# Patient Record
Sex: Female | Born: 1970
Health system: Southern US, Community
[De-identification: ages and names within clinical notes are randomized; demographics above are authoritative.]

## PROBLEM LIST (undated history)

## (undated) ENCOUNTER — Emergency Department (HOSPITAL_BASED_OUTPATIENT_CLINIC_OR_DEPARTMENT_OTHER): Payer: BC Managed Care – PPO

## (undated) DIAGNOSIS — F32A Depression, unspecified: Secondary | ICD-10-CM

## (undated) DIAGNOSIS — Z8719 Personal history of other diseases of the digestive system: Secondary | ICD-10-CM

## (undated) DIAGNOSIS — G43909 Migraine, unspecified, not intractable, without status migrainosus: Secondary | ICD-10-CM

## (undated) DIAGNOSIS — T783XXA Angioneurotic edema, initial encounter: Secondary | ICD-10-CM

## (undated) DIAGNOSIS — J45909 Unspecified asthma, uncomplicated: Secondary | ICD-10-CM

## (undated) DIAGNOSIS — R32 Unspecified urinary incontinence: Secondary | ICD-10-CM

## (undated) DIAGNOSIS — A630 Anogenital (venereal) warts: Secondary | ICD-10-CM

## (undated) DIAGNOSIS — R87619 Unspecified abnormal cytological findings in specimens from cervix uteri: Secondary | ICD-10-CM

## (undated) DIAGNOSIS — F419 Anxiety disorder, unspecified: Secondary | ICD-10-CM

## (undated) DIAGNOSIS — B019 Varicella without complication: Secondary | ICD-10-CM

## (undated) DIAGNOSIS — K219 Gastro-esophageal reflux disease without esophagitis: Secondary | ICD-10-CM

## (undated) DIAGNOSIS — F329 Major depressive disorder, single episode, unspecified: Secondary | ICD-10-CM

## (undated) DIAGNOSIS — Z8619 Personal history of other infectious and parasitic diseases: Secondary | ICD-10-CM

## (undated) DIAGNOSIS — Z87440 Personal history of urinary (tract) infections: Secondary | ICD-10-CM

## (undated) DIAGNOSIS — L509 Urticaria, unspecified: Secondary | ICD-10-CM

## (undated) HISTORY — DX: Unspecified asthma, uncomplicated: J45.909

## (undated) HISTORY — DX: Depression, unspecified: F32.A

## (undated) HISTORY — DX: Urticaria, unspecified: L50.9

## (undated) HISTORY — DX: Personal history of urinary (tract) infections: Z87.440

## (undated) HISTORY — DX: Gastro-esophageal reflux disease without esophagitis: K21.9

## (undated) HISTORY — DX: Personal history of other infectious and parasitic diseases: Z86.19

## (undated) HISTORY — DX: Unspecified urinary incontinence: R32

## (undated) HISTORY — DX: Anxiety disorder, unspecified: F41.9

## (undated) HISTORY — DX: Personal history of other diseases of the digestive system: Z87.19

## (undated) HISTORY — DX: Angioneurotic edema, initial encounter: T78.3XXA

## (undated) HISTORY — DX: Unspecified abnormal cytological findings in specimens from cervix uteri: R87.619

## (undated) HISTORY — DX: Varicella without complication: B01.9

## (undated) HISTORY — DX: Anogenital (venereal) warts: A63.0

## (undated) HISTORY — DX: Migraine, unspecified, not intractable, without status migrainosus: G43.909

## (undated) HISTORY — PX: TONSILLECTOMY: SUR1361

## (undated) HISTORY — PX: ADENOIDECTOMY: SUR15

## (undated) HISTORY — DX: Major depressive disorder, single episode, unspecified: F32.9

---
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
## 1994-07-29 DIAGNOSIS — R87619 Unspecified abnormal cytological findings in specimens from cervix uteri: Secondary | ICD-10-CM

## 1994-07-29 HISTORY — DX: Unspecified abnormal cytological findings in specimens from cervix uteri: R87.619

## 1994-07-29 HISTORY — PX: TONSILLECTOMY AND ADENOIDECTOMY: SHX28

## 1994-07-29 HISTORY — PX: CERVICAL BIOPSY  W/ LOOP ELECTRODE EXCISION: SUR135

## 1995-07-30 HISTORY — PX: CHOLECYSTECTOMY: SHX55

## 2010-09-18 DIAGNOSIS — R7301 Impaired fasting glucose: Secondary | ICD-10-CM | POA: Insufficient documentation

## 2014-05-24 DIAGNOSIS — M545 Low back pain, unspecified: Secondary | ICD-10-CM | POA: Insufficient documentation

## 2016-08-20 DIAGNOSIS — R7401 Elevation of levels of liver transaminase levels: Secondary | ICD-10-CM | POA: Insufficient documentation

## 2016-08-20 DIAGNOSIS — R740 Nonspecific elevation of levels of transaminase and lactic acid dehydrogenase [LDH]: Secondary | ICD-10-CM

## 2016-08-20 DIAGNOSIS — R7303 Prediabetes: Secondary | ICD-10-CM | POA: Insufficient documentation

## 2016-08-20 DIAGNOSIS — K922 Gastrointestinal hemorrhage, unspecified: Secondary | ICD-10-CM | POA: Insufficient documentation

## 2017-10-13 ENCOUNTER — Encounter: Payer: Self-pay | Admitting: Family Medicine

## 2017-10-13 ENCOUNTER — Ambulatory Visit (INDEPENDENT_AMBULATORY_CARE_PROVIDER_SITE_OTHER)
Admission: RE | Admit: 2017-10-13 | Discharge: 2017-10-13 | Disposition: A | Discharge status: Home / Self Care | Payer: No Typology Code available for payment source | Source: Ambulatory Visit | Attending: Family Medicine | Admitting: Family Medicine

## 2017-10-13 ENCOUNTER — Ambulatory Visit (INDEPENDENT_AMBULATORY_CARE_PROVIDER_SITE_OTHER): Payer: No Typology Code available for payment source | Admitting: Family Medicine

## 2017-10-13 VITALS — BP 126/82 | HR 67 | Temp 98.5°F | Resp 12 | Ht 67.0 in | Wt 313.2 lb

## 2017-10-13 DIAGNOSIS — R739 Hyperglycemia, unspecified: Secondary | ICD-10-CM

## 2017-10-13 DIAGNOSIS — G43009 Migraine without aura, not intractable, without status migrainosus: Secondary | ICD-10-CM | POA: Diagnosis not present

## 2017-10-13 DIAGNOSIS — M545 Low back pain, unspecified: Secondary | ICD-10-CM

## 2017-10-13 LAB — URINALYSIS, ROUTINE W REFLEX MICROSCOPIC
Bilirubin Urine: NEGATIVE
Hgb urine dipstick: NEGATIVE
KETONES UR: NEGATIVE
Leukocytes, UA: NEGATIVE
Nitrite: NEGATIVE
PH: 6 (ref 5.0–8.0)
RBC / HPF: NONE SEEN (ref 0–?)
SPECIFIC GRAVITY, URINE: 1.025 (ref 1.000–1.030)
Total Protein, Urine: NEGATIVE
UROBILINOGEN UA: 0.2 (ref 0.0–1.0)
Urine Glucose: NEGATIVE

## 2017-10-13 LAB — CBC
HCT: 41 % (ref 36.0–46.0)
Hemoglobin: 13.7 g/dL (ref 12.0–15.0)
MCHC: 33.4 g/dL (ref 30.0–36.0)
MCV: 85.9 fl (ref 78.0–100.0)
PLATELETS: 255 10*3/uL (ref 150.0–400.0)
RBC: 4.78 Mil/uL (ref 3.87–5.11)
RDW: 14 % (ref 11.5–15.5)
WBC: 6.3 10*3/uL (ref 4.0–10.5)

## 2017-10-13 LAB — BASIC METABOLIC PANEL
BUN: 8 mg/dL (ref 6–23)
CALCIUM: 9.1 mg/dL (ref 8.4–10.5)
CO2: 28 meq/L (ref 19–32)
Chloride: 102 mEq/L (ref 96–112)
Creatinine, Ser: 0.65 mg/dL (ref 0.40–1.20)
GFR: 104.46 mL/min (ref >60.00)
GLUCOSE: 95 mg/dL (ref 70–99)
POTASSIUM: 3.9 meq/L (ref 3.5–5.1)
Sodium: 137 mEq/L (ref 135–145)

## 2017-10-13 LAB — HEMOGLOBIN A1C: Hgb A1c MFr Bld: 5.6 % (ref 4.6–6.5)

## 2017-10-13 MED ORDER — METHOCARBAMOL 500 MG PO TABS: 500.0000 mg | ORAL_TABLET | Freq: Three times a day (TID) | ORAL | 1 refills | Status: DC | PRN

## 2017-10-13 MED FILL — METHOCARBAMOL 500 MG TABS: 500 | 15 days supply | Qty: 45 | Fill #0

## 2017-10-13 NOTE — Patient Instructions (Addendum)
A few things to remember from today's visit:   Right-sided low back pain without sciatica, unspecified chronicity - Plan: DG Lumbar Spine Complete, CBC, Urinalysis, Routine w reflex microscopic  Hyperglycemia - Plan: Basic metabolic panel, Hemoglobin A1c    Back pain is very common in adults.The cause of back pain is rarely dangerous and the pain often gets better over time even with no pharmacologic treatment.  The cause of your back pain may not be known. Some common causes of back pain include: 1. Strain of the muscles or ligaments supporting the spine. 2. Wear and tear (degeneration) of the spinal disks. 3. Arthritis. 4. Direct injury to the back.  For many people, back pain may return. Since back pain is rarely dangerous, most people can learn to manage this condition on their own.  HOME CARE INSTRUCTIONS Watch your back pain for any changes. The following actions may help to lessen any discomfort you are feeling:  1. Remain active. It is stressful on your back to sit or stand in one place for long periods of time. Do not sit, drive, or stand in one place for more than 30 minutes at a time. Take short walks on even surfaces as soon as you are able.Try to increase the length of time you walk each day.  2. Exercise regularly as directed by your health care provider. Exercise helps your back heal faster. It also helps avoid future injury by keeping your muscles strong and flexible.  3. Do not stay in bed.Resting more than 1-2 days can delay your recovery.                                                      4. Pay attention to your body when you bend and lift. The most comfortable positions are those that put less stress on your recovering back.  5.  Always use proper lifting techniques, including: Bending your knees. Keeping the load close to your body. Avoiding twisting.  6. Find a comfortable position to sleep. Use a firm mattress and lie on your side with your knees  slightly bent. If you lie on your back, put a pillow under your knees.  7. Over the counter rubbing medications like Icy Hot or Asper cream with Lidocaine may help without significant side effects.  Acetaminophen and/or Aleve/Ibuprofen can be taken if needed and if not contraindications. Local ice and heat may be alternated to reduce pain and spasms. Also massage and even chiropractor treatment.      Muscle relaxants might or might not help, they cause drowsiness among other    side effects. They could also interact with some of medications you may be already taking (medications for depression/anxiety and some pain medications).   8. Maintain a healthy weight. Excess weight puts extra stress on your back and makes it difficult to maintain good posture.   SEEK MEDICAL CARE IF: worsening pain, associated fever, rash/edema on area, pain going to legs or buttocks, numbness/tingling, night pain, or abnormal weight loss.    SEEK IMMEDIATE MEDICAL CARE IF:  1. You develop new bowel or bladder control problems. 2. You have unusual weakness or numbness in your arms or legs. 3. You develop nausea or vomiting. 4. You develop abdominal pain. 5. You feel faint.     Back Exercises The following exercises strengthen  the muscles that help to support the back. They also help to keep the lower back flexible. Doing these exercises can help to prevent back pain or lessen existing pain. If you have back pain or discomfort, try doing these exercises 2-3 times each day or as told by your health care provider. When the pain goes away, do them once each day, but increase the number of times that you repeat the steps for each exercise (do more repetitions). If you do not have back pain or discomfort, do these exercises once each day or as told by your health care provider.   EXERCISES Single Knee to Chest Repeat these steps 3-5 times for each leg: 5. Lie on your back on a firm bed or the floor with your legs  extended. 6. Bring one knee to your chest. Your other leg should stay extended and in contact with the floor. 7. Hold your knee in place by grabbing your knee or thigh. 8. Pull on your knee until you feel a gentle stretch in your lower back. 9. Hold the stretch for 10-30 seconds. 10. Slowly release and straighten your leg.  Pelvic Tilt Repeat these steps 5-10 times: 2. Lie on your back on a firm bed or the floor with your legs extended. 3. Bend your knees so they are pointing toward the ceiling and your feet are flat on the floor. 4. Tighten your lower abdominal muscles to press your lower back against the floor. This motion will tilt your pelvis so your tailbone points up toward the ceiling instead of pointing to your feet or the floor. 5. With gentle tension and even breathing, hold this position for 5-10 seconds.  Cat-Cow Repeat these steps until your lower back becomes more flexible: 1. Get into a hands-and-knees position on a firm surface. Keep your hands under your shoulders, and keep your knees under your hips. You may place padding under your knees for comfort. 2. Let your head hang down, and point your tailbone toward the floor so your lower back becomes rounded like the back of a cat. 3. Hold this position for 5 seconds. 4. Slowly lift your head and point your tailbone up toward the ceiling so your back forms a sagging arch like the back of a cow. 5. Hold this position for 5 seconds.   Press-Ups Repeat these steps 5-10 times: 6. Lie on your abdomen (face-down) on the floor. 7. Place your palms near your head, about shoulder-width apart. 8. While you keep your back as relaxed as possible and keep your hips on the floor, slowly straighten your arms to raise the top half of your body and lift your shoulders. Do not use your back muscles to raise your upper torso. You may adjust the placement of your hands to make yourself more comfortable. 9. Hold this position for 5 seconds while  you keep your back relaxed. 10. Slowly return to lying flat on the floor.   Bridges Repeat these steps 10 times: 1. Lie on your back on a firm surface. 2. Bend your knees so they are pointing toward the ceiling and your feet are flat on the floor. 3. Tighten your buttocks muscles and lift your buttocks off of the floor until your waist is at almost the same height as your knees. You should feel the muscles working in your buttocks and the back of your thighs. If you do not feel these muscles, slide your feet 1-2 inches farther away from your buttocks. 4. Hold this  position for 3-5 seconds. 5. Slowly lower your hips to the starting position, and allow your buttocks muscles to relax completely. If this exercise is too easy, try doing it with your arms crossed over your chest.    Please be sure medication list is accurate. If a new problem present, please set up appointment sooner than planned today.

## 2017-10-13 NOTE — Progress Notes (Signed)
HPI:   Ms.Trinitey Oree Mirelez is a 47 y.o. female, who is here today to establish care.  Former PCP: Dr Edwin Dada Last preventive routine visit: 2017  Chronic medical problems: Past Hx of depression and anxiety in her early 20's, abnormal LFT's, back pain and arthralgias. Migraine headaches, she has not had one in a couple months. She takes Maxalt 10 mg as needed. Headache is bitemporal,parietal,and sometimes occipital. It seems trigger by stress. No associated nausea or vomiting,sometiems photophobia.  She started exercising a couple weeks ago. She has not been consistent with a healthy diet.  Hx of asthma: She has occasional exacerbations, she is on Albuterol inh prn.  Concerns today: Possible "kidney stone." No Hx of nephrolithiasis.  She has had right lower back pain for the past 2 months, it has been intermittently for years. Denies dysuria,increased urinary frequency, gross hematuria,or decreased urine output. Hx of urine urgency incontinence.  She had diarrhea a week ago but bowel habits back to his normal. Nausea, which she has had for 5 years ,intermittently.  She used to get "steroid shots" , no longer radiated to LE's. Exacerbated by certain movements, changing positions in bed. Alleviated by rest.  Sharp 10/10. No Hx of recent injury. Takes Ibuprofen as needed.   She denies saddle anesthesia or bowel incontinence.  Hyperglycemia,no Hx of DM. 69-113.  "Mole" she has had for many years. She thinks it is growing. No Hx of skin cancer. No tenderness or easy bleeding.   Review of Systems  Constitutional: Negative for activity change, appetite change, fatigue and fever.  HENT: Negative for mouth sores, sore throat and trouble swallowing.   Eyes: Negative for redness and visual disturbance.  Respiratory: Negative for cough, shortness of breath and wheezing.   Cardiovascular: Negative for leg swelling.  Gastrointestinal: Positive for nausea. Negative  for abdominal pain, blood in stool and vomiting.  Endocrine: Negative for cold intolerance and heat intolerance.  Genitourinary: Negative for decreased urine volume, dysuria, hematuria, vaginal bleeding and vaginal discharge.  Musculoskeletal: Positive for arthralgias (knees), back pain and neck pain. Negative for joint swelling.  Skin: Negative for rash.  Allergic/Immunologic: Positive for environmental allergies.  Neurological: Negative for syncope, weakness, numbness and headaches.  Hematological: Negative for adenopathy. Does not bruise/bleed easily.  Psychiatric/Behavioral: Positive for sleep disturbance. Negative for confusion. The patient is nervous/anxious.       Current Outpatient Medications on File Prior to Visit  Medication Sig Dispense Refill  . albuterol (VENTOLIN HFA) 108 (90 Base) MCG/ACT inhaler as needed.    Marland Kitchen ibuprofen (ADVIL,MOTRIN) 200 MG tablet Take 200 mg by mouth as needed.     No current facility-administered medications on file prior to visit.      Past Medical History:  Diagnosis Date  . Asthma   . Chicken pox   . Depression   . GERD (gastroesophageal reflux disease)   . History of diverticulitis   . History of genital warts   . Hx: UTI (urinary tract infection)   . Migraines   . Urine incontinence    Allergies  Allergen Reactions  . Cephalosporins Hives and Rash  . Penicillins Hives and Rash    Family History  Problem Relation Age of Onset  . Arthritis Mother   . Cancer Mother   . Depression Mother   . Hyperlipidemia Mother   . Miscarriages / [place] Mother   . Early death Father   . Alcohol abuse Maternal Grandmother   . Arthritis Maternal Grandmother   .  Birth defects Maternal Grandmother   . Depression Maternal Grandmother   . Diabetes Maternal Grandmother   . Hyperlipidemia Maternal Grandmother   . Hypertension Maternal Grandmother   . Kidney disease Maternal Grandmother   . Arthritis Maternal Grandfather   . Birth defects  Maternal Grandfather   . Hypertension Maternal Grandfather   . Kidney disease Maternal Grandfather   . Heart attack Maternal Grandfather   . Alcohol abuse Paternal Grandmother   . Birth defects Paternal Grandmother   . Alcohol abuse Paternal Grandfather     Social History   Socioeconomic History  . Marital status: Married    Spouse name: Not on file  . Number of children: 0  . Years of education: Not on file  . Highest education level: Not on file  Occupational History  . Not on file  Social Needs  . Financial resource strain: Not on file  . Food insecurity:    Worry: Not on file    Inability: Not on file  . Transportation needs:    Medical: Not on file    Non-medical: Not on file  Tobacco Use  . Smoking status: Never Smoker  . Smokeless tobacco: Never Used  Substance and Sexual Activity  . Alcohol use: No    Frequency: Never  . Drug use: No  . Sexual activity: Yes    Partners: Male  Lifestyle  . Physical activity:    Days per week: Not on file    Minutes per session: Not on file  . Stress: Not on file  Relationships  . Social connections:    Talks on phone: Not on file    Gets together: Not on file    Attends religious service: Not on file    Active member of club or organization: Not on file    Attends meetings of clubs or organizations: Not on file    Relationship status: Not on file  Other Topics Concern  . Not on file  Social History Narrative  . Not on file    Vitals:   10/13/17 1039  BP: 126/82  Pulse: 67  Resp: 12  Temp: 98.5 F (36.9 C)  SpO2: 97%    Body mass index is 49.06 kg/m.   Physical Exam  Nursing note and vitals reviewed. Constitutional: She is oriented to person, place, and time. She appears well-developed. No distress.  HENT:  Head: Normocephalic and atraumatic.  Mouth/Throat: Oropharynx is clear and moist and mucous membranes are normal.  Eyes: Pupils are equal, round, and reactive to light. Conjunctivae are normal.    Cardiovascular: Normal rate and regular rhythm.  No murmur heard. Pulses:      Dorsalis pedis pulses are 2+ on the right side, and 2+ on the left side.  Respiratory: Effort normal and breath sounds normal. No respiratory distress.  GI: Soft. She exhibits no mass. There is no hepatomegaly. There is no tenderness. There is no CVA tenderness.  Musculoskeletal: She exhibits no edema.       Lumbar back: She exhibits tenderness. She exhibits no bony tenderness.       Back:  Lymphadenopathy:    She has no cervical adenopathy.  Neurological: She is alert and oriented to person, place, and time. She has normal strength. Gait normal.  SLR negative bilateral.  Skin: Skin is warm. No rash noted. No erythema.  Psychiatric: Her mood appears anxious.  Well groomed, good eye contact.      ASSESSMENT AND PLAN:   Ms.Nalany was seen today  for establish care.  Diagnoses and all orders for this visit:  Lab Results  Component Value Date   WBC 6.3 10/13/2017   HGB 13.7 10/13/2017   HCT 41.0 10/13/2017   MCV 85.9 10/13/2017   PLT 255.0 10/13/2017   Lab Results  Component Value Date   CREATININE 0.65 10/13/2017   BUN 8 10/13/2017   NA 137 10/13/2017   K 3.9 10/13/2017   CL 102 10/13/2017   CO2 28 10/13/2017   Lab Results  Component Value Date   HGBA1C 5.6 10/13/2017    Right-sided low back pain without sciatica, unspecified chronicity  Hx of chronic back pain. I do not think back pain etiology is nephrolithiasis. Further recommendations will be given according to labs/imaging results. Local ice and massage or Icy hot patch may help. Instructed about warning signs.  -     DG Lumbar Spine Complete; Future -     CBC -     Urinalysis, Routine w reflex microscopic -     methocarbamol (ROBAXIN) 500 MG tablet; Take 1 tablet (500 mg total) by mouth every 8 (eight) hours as needed for muscle spasms.  Hyperglycemia  Primary prevention through a healthy life style recommended for  primary prevention.  -     Basic metabolic panel -     Hemoglobin A1c  Morbid obesity with body mass index (BMI) of 40.0 to 49.9 (HCC)  We discussed benefits of wt loss as well as adverse effects of obesity. Consistency with healthy diet and physical activity recommended. Daily brisk walking for 15-30 min as tolerated.  Migraine without aura and without status migrainosus, not intractable  Reported by pt. ? Combination with tension headache. Improved. No changes in current management. F/U annually,before if needed.  -     rizatriptan (MAXALT) 10 MG tablet; Take 1 tablet (10 mg total) by mouth daily as needed for migraine.      Betty G. Martinique, MD  [facility]. [place] office.

## 2017-10-14 ENCOUNTER — Encounter: Payer: Self-pay | Admitting: Family Medicine

## 2017-10-16 ENCOUNTER — Encounter: Payer: Self-pay | Admitting: Family Medicine

## 2017-10-16 MED ORDER — RIZATRIPTAN BENZOATE 10 MG PO TABS: 10.0000 mg | ORAL_TABLET | Freq: Every day | ORAL | 2 refills | Status: DC | PRN

## 2017-10-16 MED FILL — RIZATRIPTAN BENZOATE 10 MG: 10 | 30 days supply | Qty: 10 | Fill #0

## 2017-10-29 ENCOUNTER — Encounter: Payer: Self-pay | Admitting: Family Medicine

## 2017-11-05 ENCOUNTER — Encounter: Payer: Self-pay | Admitting: Family Medicine

## 2017-11-05 ENCOUNTER — Ambulatory Visit (INDEPENDENT_AMBULATORY_CARE_PROVIDER_SITE_OTHER): Payer: No Typology Code available for payment source | Admitting: Family Medicine

## 2017-11-05 ENCOUNTER — Other Ambulatory Visit: Payer: Self-pay | Admitting: *Deleted

## 2017-11-05 VITALS — BP 130/82 | HR 77 | Temp 98.4°F | Resp 12 | Ht 67.0 in | Wt 312.2 lb

## 2017-11-05 DIAGNOSIS — M549 Dorsalgia, unspecified: Secondary | ICD-10-CM | POA: Diagnosis not present

## 2017-11-05 DIAGNOSIS — K922 Gastrointestinal hemorrhage, unspecified: Secondary | ICD-10-CM | POA: Diagnosis not present

## 2017-11-05 DIAGNOSIS — M25552 Pain in left hip: Secondary | ICD-10-CM

## 2017-11-05 DIAGNOSIS — R1084 Generalized abdominal pain: Secondary | ICD-10-CM | POA: Diagnosis not present

## 2017-11-05 DIAGNOSIS — K5900 Constipation, unspecified: Secondary | ICD-10-CM

## 2017-11-05 MED ORDER — TRAMADOL HCL 50 MG PO TABS: 50.0000 mg | ORAL_TABLET | Freq: Two times a day (BID) | ORAL | 0 refills | Status: DC | PRN

## 2017-11-05 MED ORDER — OMEPRAZOLE 40 MG PO CPDR: 40.0000 mg | DELAYED_RELEASE_CAPSULE | Freq: Every day | ORAL | 2 refills | Status: DC

## 2017-11-05 MED ORDER — MELOXICAM 15 MG PO TABS: 15.0000 mg | ORAL_TABLET | Freq: Every day | ORAL | 0 refills | Status: DC

## 2017-11-05 MED ORDER — RIZATRIPTAN BENZOATE 10 MG PO TBDP: 10.0000 mg | ORAL_TABLET | ORAL | 3 refills | Status: DC | PRN

## 2017-11-05 MED ORDER — KETOROLAC TROMETHAMINE 60 MG/2ML IM SOLN
60.0000 mg | Freq: Once | INTRAMUSCULAR | Status: AC
  Administered 2017-11-05: 60 mg via INTRAMUSCULAR

## 2017-11-05 MED FILL — OMEPRAZOLE DR 40 MG CAPSULE: 40 | 30 days supply | Qty: 30 | Fill #0

## 2017-11-05 MED FILL — traMADol HCL 50 MG TABS: 50 | 8 days supply | Qty: 15 | Fill #0

## 2017-11-05 MED FILL — MELOXICAM 15 MG TABLET: 15 | 30 days supply | Qty: 30 | Fill #0

## 2017-11-05 NOTE — Patient Instructions (Signed)
A few things to remember from today's visit:   Back pain with radiation - Plan: Ambulatory referral to Orthopedic Surgery, traMADol (ULTRAM) 50 MG tablet, meloxicam (MOBIC) 15 MG tablet  Left hip pain - Plan: Ambulatory referral to Orthopedic Surgery, traMADol (ULTRAM) 50 MG tablet, meloxicam (MOBIC) 15 MG tablet  Constipation, unspecified constipation type  Generalized abdominal pain - Plan: Ambulatory referral to Gastroenterology  Gastrointestinal hemorrhage, unspecified gastrointestinal hemorrhage type - Plan: Ambulatory referral to Gastroenterology, omeprazole (PRILOSEC) 40 MG capsule  Here in the office she received Toradol, tomorrow you can start Mobic 1 tablet daily as needed.  You can also take Tylenol arthritis over-the-counter 3 times per day.  At bedtime you can take tramadol, this medication can aggravate constipation. Over-the-counter MiraLAX and bisacodyl might help with constipation.  Increase fluid and fiber intake.  Referral to orthopedist and gastroenterologist were placed as requested.  Please be sure medication list is accurate. If a new problem present, please set up appointment sooner than planned today.

## 2017-11-05 NOTE — Progress Notes (Signed)
ACUTE VISIT   HPI:  Chief Complaint  Patient presents with  . Referrals    needed for ortho and gastro    [name] is a 47 y.o. female, who is here today requesting referral to GI and orthopedist.  She is complaining of worsening of lower back pain for the past couple months, sharp, constant, 10/10, radiated to left lower extremity, occasionally she feels numbness on both extremities depending of position when seated.  Pain is aggravated by prolonged walking, standing, and when lying on left side in bed. Alleviated by changing positions.  No recent injury/trauma.  She denies fever, chills, saddle anesthesia, urine/bowel incontinence.  She is taking ibuprofen and naproxen intermittently. She is reporting 4-5 years history of lower back pain, in the past she received epidural injections.   Abdominal pain: She points to epigastrium and periumbilical area. "Gas pain" She is also reporting history of abdominal pain, related to IBS. For the past few days pain has been constant, pounding ,8/10, not exacerbated by food intake. Sometimes alleviated by passing gas and defecation.  She has had some constipation, last bowel movement was this morning, she had to strain. She is reporting black stools for 2 days, today stool was not "as dark" as it has been.  Today she also noticed some red blood on tissue after defecation. She is reporting history of PUD with upper GI bleed. She has been taking NSAID's for back pain.  She is not taking any OTC medication, in the past she has been prescribed Pepcid.   Review of Systems  Constitutional: Positive for activity change. Negative for appetite change, fatigue and fever.  HENT: Negative for mouth sores, nosebleeds, sore throat and trouble swallowing.   Respiratory: Negative for shortness of breath and wheezing.   Cardiovascular: Negative for leg swelling.  Gastrointestinal: Positive for abdominal pain, anal bleeding and  constipation. Negative for abdominal distention, nausea and vomiting.  Genitourinary: Positive for frequency (No more than usual.). Negative for dysuria, hematuria, vaginal bleeding and vaginal discharge.  Musculoskeletal: Positive for arthralgias and back pain.  Skin: Negative for color change and rash.  Neurological: Positive for numbness. Negative for weakness.  Hematological: Negative for adenopathy. Does not bruise/bleed easily.  Psychiatric/Behavioral: Positive for sleep disturbance. Negative for confusion. The patient is nervous/anxious.       Current Outpatient Medications on File Prior to Visit  Medication Sig Dispense Refill  . albuterol (VENTOLIN HFA) 108 (90 Base) MCG/ACT inhaler as needed.    Marland Kitchen ibuprofen (ADVIL,MOTRIN) 200 MG tablet Take 200 mg by mouth as needed.    . methocarbamol (ROBAXIN) 500 MG tablet Take 1 tablet (500 mg total) by mouth every 8 (eight) hours as needed for muscle spasms. 45 tablet 1   No current facility-administered medications on file prior to visit.      Past Medical History:  Diagnosis Date  . Asthma   . Chicken pox   . Depression   . GERD (gastroesophageal reflux disease)   . History of diverticulitis   . History of genital warts   . Hx: UTI (urinary tract infection)   . Migraines   . Urine incontinence    Allergies  Allergen Reactions  . Cephalosporins Hives and Rash  . Penicillins Hives and Rash    Social History   Socioeconomic History  . Marital status: Married    Spouse name: Not on file  . Number of children: 0  . Years of education: Not on file  .  Highest education level: Not on file  Occupational History  . Not on file  Social Needs  . Financial resource strain: Not on file  . Food insecurity:    Worry: Not on file    Inability: Not on file  . Transportation needs:    Medical: Not on file    Non-medical: Not on file  Tobacco Use  . Smoking status: Never Smoker  . Smokeless tobacco: Never Used  Substance and  Sexual Activity  . Alcohol use: No    Frequency: Never  . Drug use: No  . Sexual activity: Yes    Partners: Male  Lifestyle  . Physical activity:    Days per week: Not on file    Minutes per session: Not on file  . Stress: Not on file  Relationships  . Social connections:    Talks on phone: Not on file    Gets together: Not on file    Attends religious service: Not on file    Active member of club or organization: Not on file    Attends meetings of clubs or organizations: Not on file    Relationship status: Not on file  Other Topics Concern  . Not on file  Social History Narrative  . Not on file    Vitals:   11/05/17 1607  BP: 130/82  Pulse: 77  Resp: 12  Temp: 98.4 F (36.9 C)  SpO2: 96%   Body mass index is 48.91 kg/m.   Physical Exam  Nursing note and vitals reviewed. Constitutional: She is oriented to person, place, and time. She appears well-developed. She does not appear ill. She appears distressed (Due to pain.).  HENT:  Head: Normocephalic and atraumatic.  Eyes: Pupils are equal, round, and reactive to light. Conjunctivae are normal.  Cardiovascular: Normal rate and regular rhythm.  No murmur heard. Respiratory: Effort normal and breath sounds normal. No respiratory distress.  GI: Soft. Bowel sounds are normal. She exhibits no mass. There is no hepatomegaly. There is tenderness in the epigastric area and periumbilical area. There is no rigidity, no rebound and no guarding.  Genitourinary:  Genitourinary Comments: Rectal exam deferred to GI.  Musculoskeletal: She exhibits tenderness. She exhibits no edema.       Lumbar back: She exhibits decreased range of motion and tenderness. She exhibits no bony tenderness.       Back:  No significant deformity appreciated. There is tenderness upon palpation of paraspinal muscles,right A1-9 Pain elicited with movement on exam table during examination. No local edema or erythema appreciated, no suspicious  lesions.    Lymphadenopathy:    She has no cervical adenopathy.  Neurological: She is alert and oriented to person, place, and time. She has normal strength.  Reflex Scores:      Patellar reflexes are 2+ on the right side and 2+ on the left side. SLR negative bilateral. Antalgic gait.  Skin: Skin is warm. No rash noted. No erythema.  Psychiatric: Her mood appears anxious. Her affect is labile.  Well groomed, good eye contact.      ASSESSMENT AND PLAN:   Ms. Alexa West was seen today for referrals.  Diagnoses and all orders for this visit:  Back pain with radiation  Here in the office and after verbal consent Toradol 60 mg IM given. Mobic may be better tolerated that other NSAID's,some side effects discussed. Tramadol at bedtime. Ortho referral placed. Monitor for worrisome signs.  -     Ambulatory referral to Orthopedic Surgery -  traMADol (ULTRAM) 50 MG tablet; Take 1 tablet (50 mg total) by mouth every 12 (twelve) hours as needed for up to 7 days. -     meloxicam (MOBIC) 15 MG tablet; Take 1 tablet (15 mg total) by mouth daily. -     ketorolac (TORADOL) injection 60 mg  Left hip pain  ? Trochanteric bursitis,radicular,OA among some to consider.  -     Ambulatory referral to Orthopedic Surgery -     traMADol (ULTRAM) 50 MG tablet; Take 1 tablet (50 mg total) by mouth every 12 (twelve) hours as needed for up to 7 days. -     meloxicam (MOBIC) 15 MG tablet; Take 1 tablet (15 mg total) by mouth daily. -     ketorolac (TORADOL) injection 60 mg  Constipation, unspecified constipation type  Recommend OTC Miralax and Dulcolax. Adequate fiber and fluid intake. We could consider Linzess if not better with OTC treatment.  Generalized abdominal pain  Epigastric most likely related to gastritis, aggravated with OTC NSAID's. Hx IBS, which can also be contributing to pain. No signs of acute abdomen on examination today.  -     Ambulatory referral to Gastroenterology -      ketorolac (TORADOL) injection 60 mg  Gastrointestinal hemorrhage, unspecified gastrointestinal hemorrhage type  Melena and red blood on tissue (?hemorroids). Recommendations given to treat constipation.  Hx of PUD. GI referral placed. PPI recommended. Monitor for warning signs.   -     Ambulatory referral to Gastroenterology -     omeprazole (PRILOSEC) 40 MG capsule; Take 1 capsule (40 mg total) by mouth daily.      -[name] was advised to seek immediate medical attention if sudden worsening symptoms.      Tiawana Forgy G. Martinique, MD  [facility]. [place] office.

## 2017-11-06 ENCOUNTER — Ambulatory Visit (INDEPENDENT_AMBULATORY_CARE_PROVIDER_SITE_OTHER): Payer: No Typology Code available for payment source

## 2017-11-06 ENCOUNTER — Encounter (INDEPENDENT_AMBULATORY_CARE_PROVIDER_SITE_OTHER): Payer: Self-pay | Admitting: Specialist

## 2017-11-06 ENCOUNTER — Ambulatory Visit (INDEPENDENT_AMBULATORY_CARE_PROVIDER_SITE_OTHER): Payer: No Typology Code available for payment source | Admitting: Specialist

## 2017-11-06 VITALS — BP 121/75 | HR 69 | Ht 67.0 in | Wt 312.0 lb

## 2017-11-06 DIAGNOSIS — M549 Dorsalgia, unspecified: Secondary | ICD-10-CM

## 2017-11-06 DIAGNOSIS — M25552 Pain in left hip: Secondary | ICD-10-CM

## 2017-11-06 DIAGNOSIS — S92342A Displaced fracture of fourth metatarsal bone, left foot, initial encounter for closed fracture: Secondary | ICD-10-CM

## 2017-11-06 DIAGNOSIS — M25562 Pain in left knee: Secondary | ICD-10-CM

## 2017-11-06 DIAGNOSIS — G8929 Other chronic pain: Secondary | ICD-10-CM

## 2017-11-06 DIAGNOSIS — M4815 Ankylosing hyperostosis [Forestier], thoracolumbar region: Secondary | ICD-10-CM

## 2017-11-06 DIAGNOSIS — M25572 Pain in left ankle and joints of left foot: Secondary | ICD-10-CM | POA: Diagnosis not present

## 2017-11-06 DIAGNOSIS — M19172 Post-traumatic osteoarthritis, left ankle and foot: Secondary | ICD-10-CM

## 2017-11-06 DIAGNOSIS — M1712 Unilateral primary osteoarthritis, left knee: Secondary | ICD-10-CM | POA: Diagnosis not present

## 2017-11-06 DIAGNOSIS — M5136 Other intervertebral disc degeneration, lumbar region: Secondary | ICD-10-CM

## 2017-11-06 DIAGNOSIS — M48062 Spinal stenosis, lumbar region with neurogenic claudication: Secondary | ICD-10-CM | POA: Diagnosis not present

## 2017-11-06 MED ORDER — TRAMADOL HCL 50 MG PO TABS: 50.0000 mg | ORAL_TABLET | Freq: Two times a day (BID) | ORAL | 0 refills | Status: AC | PRN

## 2017-11-06 NOTE — Patient Instructions (Addendum)
Decrease weight bearing on the left leg due a stress fracture of the left 4th metatarsal. Use of a chair at work and decrease standing by at lest half as well as walking distance. Order ESI central and right sided for lumbar spinal stenosis or narrowing with can irritate  Lumbar nerves with prolong walking and standing. Multivitamin tablet with vitamin D 2,000 units per day. Calcium 1500mg , 200 mg in each dairy portion Want to first rest and decrease the pain from the various over use areas of pain then Start a gradual program to improve your endurance and be able to return to full  Work. Cane for left hand with ambulating.  Avoid bending, stooping and avoid lifting weights greater than 10 lbs. Avoid prolong standing and walking. Avoid frequent bending and stooping  No lifting greater than 10 lbs. May use ice or moist heat for pain. Weight loss is of benefit.  Dr. Romona Curls secretary/Assistant will call to arrange for epidural steroid injection

## 2017-11-06 NOTE — Progress Notes (Signed)
Office Visit Note   Patient: Alexa West           Date of Birth: 16-Aug-1970           MRN: [id number] Visit Date: 11/06/2017              Requested by: Martinique, Betty G, MD [address] [place], [place] [address] PCP: Martinique, Betty G, MD   Assessment & Plan: Visit Diagnoses:  1. Pain in left hip   2. Chronic pain of left knee   3. Pain in left ankle and joints of left foot     Plan: Decrease weight bearing on the left leg due a stress fracture of the left 4th metatarsal. Use of a chair at work and decrease standing by at lest half as well as walking distance. Order ESI central and right sided for lumbar spinal stenosis or narrowing with can irritate  Lumbar nerves with prolong walking and standing. Multivitamin tablet with vitamin D 2,000 units per day. Calcium 1500mg , 200 mg in each dairy portion Want to first rest and decrease the pain from the various over use areas of pain then Start a gradual program to improve your endurance and be able to return to full  Work. Cane for left hand with ambulating.  Avoid bending, stooping and avoid lifting weights greater than 10 lbs. Avoid prolong standing and walking. Avoid frequent bending and stooping  No lifting greater than 10 lbs. May use ice or moist heat for pain. Weight loss is of benefit.  Dr. Romona Curls secretary/Assistant will call to arrange for epidural steroid injection Follow-Up Instructions: No follow-ups on file.   Orders:  Orders Placed This Encounter  Procedures  . XR HIP UNILAT W OR W/O PELVIS 2-3 VIEWS LEFT  . XR KNEE 3 VIEW LEFT  . XR Foot Complete Left   No orders of the defined types were placed in this encounter.     Procedures: No procedures performed   Clinical Data: No additional findings.   Subjective: No chief complaint on file.   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 She has noted a worsening of a chronic back condition that she has had for over  ten years. Has seen MDs in [facility], Dr. Landis Gandy with [facility], he would have her undergo ESI s every so often every couple of months but some times longer and the  Last injection was about 5 years ago. She reportst the pain is staying mostly in the right side where the kidney is . The left hip is having pain now with shooting down the left leg. By the end of the day with walking 3-3.5 miles per day she is having pain in the legs and in the back.. No bowel or bladder difficulty, she is having and increase in constipation.  Frequency but no incontinence. Has not been seen for the frequency. Told she may have an over active bladder. Tried ditropan but this caused dry mouth and nausea. Pain is  Worse with lying down then rolling over or trying to get up. She is using a pillow under the left knee to decrease the pain. There is some numbness with with prolong sitting in the Right leg probably in the back of the leg. Not a regular exercise. When she gets home she is too tired to walk or exercise. Not sure what Dr. Beather Arbour told her. Xrays were done  Done at [facility] last week and  available. She is able to drive, continues to work but is having difficulty. On a scale of 1-10 the pain stays at about a seven. Could walk a mile but it Would be uncomfortable. She is parking in the parking deck.   Review of Systems  Constitutional: Positive for activity change and fatigue. Negative for appetite change, chills, diaphoresis and unexpected weight change.  HENT: Positive for postnasal drip, rhinorrhea and sinus pressure. Negative for congestion.   Eyes: Negative.  Negative for photophobia, pain, discharge, redness, itching and visual disturbance.  Respiratory: Positive for shortness of breath and wheezing. Negative for apnea, cough, choking, chest tightness and stridor.   Cardiovascular: Negative.  Negative for chest pain, palpitations and leg swelling.  Gastrointestinal: Positive for  abdominal pain and blood in stool. Negative for abdominal distention, anal bleeding, constipation, diarrhea, nausea, rectal pain and vomiting.  Endocrine: Negative.  Negative for cold intolerance and heat intolerance.  Genitourinary: Negative.  Negative for difficulty urinating, dyspareunia, dysuria, enuresis and hematuria.  Musculoskeletal: Positive for gait problem. Negative for arthralgias, back pain, joint swelling, myalgias, neck pain and neck stiffness.  Skin: Negative.  Negative for color change, pallor, rash and wound.  Allergic/Immunologic: Positive for environmental allergies and food allergies.  Neurological: Positive for dizziness, weakness, light-headedness, numbness and headaches.  Hematological: Negative.  Negative for adenopathy. Does not bruise/bleed easily.  Psychiatric/Behavioral: Negative.  Negative for agitation, behavioral problems, confusion, decreased concentration, dysphoric mood, hallucinations, self-injury, sleep disturbance and suicidal ideas. The patient is not nervous/anxious and is not hyperactive.      Objective: Vital Signs: LMP 10/29/2017   Physical Exam  Constitutional: She is oriented to person, place, and time. She appears well-developed and well-nourished.  HENT:  Head: Normocephalic and atraumatic.  Eyes: Pupils are equal, round, and reactive to light. EOM are normal.  Neck: Normal range of motion. Neck supple.  Pulmonary/Chest: Effort normal and breath sounds normal.  Abdominal: Soft. Bowel sounds are normal.  Neurological: She is alert and oriented to person, place, and time.  Skin: Skin is warm and dry.  Psychiatric: She has a normal mood and affect. Her behavior is normal. Judgment and thought content normal.    Back Exam   Tenderness  The patient is experiencing tenderness in the lumbar.  Range of Motion  Extension: abnormal  Lateral bend right: abnormal  Lateral bend left: abnormal  Rotation right: abnormal  Rotation left: abnormal    Muscle Strength  Right Quadriceps:  5/5  Left Quadriceps:  5/5  Right Hamstrings:  5/5  Left Hamstrings:  5/5   Tests  Straight leg raise right: negative Straight leg raise left: negative  Reflexes  Patellar: normal Achilles: normal Babinski's sign: normal   Other  Toe walk: normal Heel walk: normal Sensation: normal Gait: normal  Erythema: no back redness Scars: absent      Specialty Comments:  No specialty comments available.  Imaging: No results found.   PMFS History: Patient Active Problem List   Diagnosis Date Noted  . Morbid obesity with body mass index (BMI) of 40.0 to 49.9 ([place]) 10/13/2017  . Migraine headache without aura 10/13/2017   Past Medical History:  Diagnosis Date  . Asthma   . Chicken pox   . Depression   . GERD (gastroesophageal reflux disease)   . History of diverticulitis   . History of genital warts   . Hx: UTI (urinary tract infection)   . Migraines   . Urine incontinence     Family History  Problem  Relation Age of Onset  . Arthritis Mother   . Cancer Mother   . Depression Mother   . Hyperlipidemia Mother   . Miscarriages / [place] Mother   . Early death Father   . Alcohol abuse Maternal Grandmother   . Arthritis Maternal Grandmother   . Birth defects Maternal Grandmother   . Depression Maternal Grandmother   . Diabetes Maternal Grandmother   . Hyperlipidemia Maternal Grandmother   . Hypertension Maternal Grandmother   . Kidney disease Maternal Grandmother   . Arthritis Maternal Grandfather   . Birth defects Maternal Grandfather   . Hypertension Maternal Grandfather   . Kidney disease Maternal Grandfather   . Heart attack Maternal Grandfather   . Alcohol abuse Paternal Grandmother   . Birth defects Paternal Grandmother   . Alcohol abuse Paternal Grandfather     Past Surgical History:  Procedure Laterality Date  . CHOLECYSTECTOMY    . TONSILLECTOMY AND ADENOIDECTOMY     Social History   Occupational  History  . Not on file  Tobacco Use  . Smoking status: Never Smoker  . Smokeless tobacco: Never Used  Substance and Sexual Activity  . Alcohol use: No    Frequency: Never  . Drug use: No  . Sexual activity: Yes    Partners: Male

## 2017-11-07 ENCOUNTER — Telehealth: Payer: Self-pay | Admitting: Family Medicine

## 2017-11-07 NOTE — Telephone Encounter (Signed)
Faxed request for medical records to [facility] 11/07/17 LM

## 2017-11-10 ENCOUNTER — Ambulatory Visit (INDEPENDENT_AMBULATORY_CARE_PROVIDER_SITE_OTHER): Payer: Self-pay | Admitting: Orthopaedic Surgery

## 2017-11-12 MED FILL — METHOCARBAMOL 500 MG TABS: 500 | 15 days supply | Qty: 45 | Fill #1

## 2017-11-19 ENCOUNTER — Ambulatory Visit (INDEPENDENT_AMBULATORY_CARE_PROVIDER_SITE_OTHER): Payer: Self-pay | Admitting: Physical Medicine and Rehabilitation

## 2017-11-20 ENCOUNTER — Encounter: Payer: Self-pay | Admitting: Physician Assistant

## 2017-11-20 ENCOUNTER — Encounter: Payer: Self-pay | Admitting: Family Medicine

## 2017-11-25 ENCOUNTER — Encounter: Payer: Self-pay | Admitting: Family Medicine

## 2017-11-28 ENCOUNTER — Other Ambulatory Visit: Payer: Self-pay | Admitting: *Deleted

## 2017-11-28 DIAGNOSIS — M7580 Other shoulder lesions, unspecified shoulder: Secondary | ICD-10-CM

## 2017-12-03 ENCOUNTER — Other Ambulatory Visit: Payer: Self-pay | Admitting: Family Medicine

## 2017-12-03 DIAGNOSIS — M25552 Pain in left hip: Secondary | ICD-10-CM

## 2017-12-03 DIAGNOSIS — M549 Dorsalgia, unspecified: Secondary | ICD-10-CM

## 2017-12-03 DIAGNOSIS — M545 Low back pain, unspecified: Secondary | ICD-10-CM

## 2017-12-03 MED FILL — MELOXICAM 15 MG TABLET: 15 | 30 days supply | Qty: 30 | Fill #0

## 2017-12-03 MED FILL — METHOCARBAMOL 500 MG TABS: 500 | 15 days supply | Qty: 45 | Fill #0

## 2017-12-05 ENCOUNTER — Encounter: Payer: Self-pay | Admitting: Physician Assistant

## 2017-12-05 ENCOUNTER — Ambulatory Visit (INDEPENDENT_AMBULATORY_CARE_PROVIDER_SITE_OTHER): Payer: No Typology Code available for payment source | Admitting: Physician Assistant

## 2017-12-05 ENCOUNTER — Telehealth: Payer: Self-pay | Admitting: Physician Assistant

## 2017-12-05 ENCOUNTER — Telehealth: Payer: Self-pay | Admitting: *Deleted

## 2017-12-05 ENCOUNTER — Other Ambulatory Visit (INDEPENDENT_AMBULATORY_CARE_PROVIDER_SITE_OTHER): Payer: No Typology Code available for payment source

## 2017-12-05 VITALS — BP 124/80 | HR 72 | Ht 67.0 in | Wt 311.1 lb

## 2017-12-05 DIAGNOSIS — R1013 Epigastric pain: Secondary | ICD-10-CM

## 2017-12-05 DIAGNOSIS — Z8711 Personal history of peptic ulcer disease: Secondary | ICD-10-CM

## 2017-12-05 DIAGNOSIS — Z9049 Acquired absence of other specified parts of digestive tract: Secondary | ICD-10-CM

## 2017-12-05 DIAGNOSIS — K200 Eosinophilic esophagitis: Secondary | ICD-10-CM | POA: Diagnosis not present

## 2017-12-05 DIAGNOSIS — E282 Polycystic ovarian syndrome: Secondary | ICD-10-CM

## 2017-12-05 DIAGNOSIS — R1310 Dysphagia, unspecified: Secondary | ICD-10-CM

## 2017-12-05 DIAGNOSIS — K589 Irritable bowel syndrome without diarrhea: Secondary | ICD-10-CM

## 2017-12-05 DIAGNOSIS — K254 Chronic or unspecified gastric ulcer with hemorrhage: Secondary | ICD-10-CM | POA: Insufficient documentation

## 2017-12-05 DIAGNOSIS — Z8719 Personal history of other diseases of the digestive system: Secondary | ICD-10-CM

## 2017-12-05 DIAGNOSIS — K922 Gastrointestinal hemorrhage, unspecified: Secondary | ICD-10-CM

## 2017-12-05 DIAGNOSIS — R1011 Right upper quadrant pain: Secondary | ICD-10-CM

## 2017-12-05 DIAGNOSIS — K250 Acute gastric ulcer with hemorrhage: Secondary | ICD-10-CM

## 2017-12-05 LAB — HEPATIC FUNCTION PANEL
ALK PHOS: 76 U/L (ref 39–117)
ALT: 46 U/L — ABNORMAL HIGH (ref 0–35)
AST: 55 U/L — ABNORMAL HIGH (ref 0–37)
Albumin: 3.6 g/dL (ref 3.5–5.2)
BILIRUBIN DIRECT: 0.1 mg/dL (ref 0.0–0.3)
BILIRUBIN TOTAL: 0.5 mg/dL (ref 0.2–1.2)
TOTAL PROTEIN: 7.4 g/dL (ref 6.0–8.3)

## 2017-12-05 LAB — SEDIMENTATION RATE: Sed Rate: 15 mm/hr (ref 0–20)

## 2017-12-05 LAB — IGA: IGA: 295 mg/dL (ref 68–378)

## 2017-12-05 MED ORDER — OMEPRAZOLE 40 MG PO CPDR: DELAYED_RELEASE_CAPSULE | ORAL | 11 refills | Status: DC

## 2017-12-05 MED ORDER — GLYCOPYRROLATE 2 MG PO TABS: ORAL_TABLET | ORAL | 6 refills | Status: DC

## 2017-12-05 MED FILL — GLYCOPYRROLATE 2 MG TABLET: 2 | 30 days supply | Qty: 30 | Fill #0

## 2017-12-05 MED FILL — OMEPRAZOLE DR 40 MG CAPSULE: 40 | 30 days supply | Qty: 60 | Fill #0

## 2017-12-05 NOTE — Progress Notes (Signed)
Agree with assessment and plan as outlined.  

## 2017-12-05 NOTE — Progress Notes (Signed)
Subjective:    Patient ID: Alexa West, female    DOB: 1970/09/29, 47 y.o.   MRN: [id number]  HPI Alexa West is a pleasant 47 year old white female, new to GI today referred by Dr. Betty Martinique with multiple GI complaints.  She comes in primarily with complaint of abdominal pain. Patient has history of adult onset diabetes mellitus, PCOS, morbid obesity, migraine headaches, she is status post remote cholecystectomy and records indicate she may have Alexa West. Patient relates she was diagnosed with a gastric ulcer in January 2018 when she was hospitalized at [facility] in [place].  I am able to see that report in care everywhere.  She had a 1 cm cratered clean-based gastric ulcer felt secondary to NSAID use and was also noted to have changes in her esophagus consistent with eosinophilic esophagitis.  Esophageal biopsies did confirm eosinophilic esophagitis.  Biopsies from the stomach showed chronic inactive gastritis no H. pylori.  She was treated with PPI therapy.  She says she was given a prescription for an inhaler after she left the hospital but could not afford to get it. Patient also relates prior colonoscopies done by Dr. Vertell Limber in [place] and says she had been given diagnosis of IBS.  She is not aware whether or not she has internal hemorrhoids. Patient says she has had problems with her esophagus for several years.  She had had some food sensitivity testing by a natural path a few years back and has many food sensitivities.  She is never had formal food allergy testing.  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  She says she been having ongoing symptoms with her esophagus with daily heartburn, intermittent dysphasia which flares up off and on.  Usually she does not have to regurgitate but has to stop eating and wait for food to go on down.  Generally is not having odynophagia.  She has been having epigastric and right upper quadrant pain off and on  over the past several months again which she describes as sharp and cramping in nature she tends to stay nauseated has not been having vomiting. She has been placed back on meloxicam over the past 3 months for bone spurs and says she has to have something.  She was also started back on omeprazole 40 mg p.o. every morning at that same time.  She has not noted any melena or hematochezia.  Bowel movements tend to alternate between diarrhea and constipation usually more towards the diarrhea side.  She has generalized abdominal discomfort frequently.  She also intermittently sees bright red blood on the tissue.  She has no complaints of rectal discomfort.  On Family history is negative for colon cancer polyps and IBD.  Review of Systems Pertinent positive and negative review of systems were noted in the above HPI section.  All other review of systems was otherwise negative.  Outpatient Encounter Medications as of 12/05/2017  Medication Sig  . albuterol (VENTOLIN HFA) 108 (90 Base) MCG/ACT inhaler as needed.  . meloxicam (MOBIC) 15 MG tablet TAKE 1 TABLET (15 MG TOTAL) BY MOUTH DAILY.  . methocarbamol (ROBAXIN) 500 MG tablet TAKE 1 TABLET BY MOUTH EVERY 8 HOURS AS NEEDED FOR MUSCLE SPASMS.  Marland Kitchen omeprazole (PRILOSEC) 40 MG capsule Take 1 tab by mouth twice daily.  . rizatriptan (MAXALT-MLT) 10 MG disintegrating tablet Take 1 tablet (10 mg total) by mouth as needed for migraine. May repeat in 2 hours if needed  . [DISCONTINUED] ibuprofen (ADVIL,MOTRIN) 200  MG tablet Take 200 mg by mouth as needed.  . [DISCONTINUED] omeprazole (PRILOSEC) 40 MG capsule Take 1 capsule (40 mg total) by mouth daily.  Marland Kitchen glycopyrrolate (ROBINUL) 2 MG tablet Take 1 tab by mouth every morning.   No facility-administered encounter medications on file as of 12/05/2017.    Allergies  Allergen Reactions  . Cephalosporins Hives and Rash  . Penicillins Hives and Rash   Patient Active Problem List   Diagnosis Date Noted  . PCOS  (polycystic ovarian syndrome) 12/05/2017  . History of IBS 12/05/2017  . S/P cholecystectomy 12/05/2017  . Gastric ulcer with hemorrhage 12/05/2017  . Eosinophilic esophagitis 62/83/6629  . Morbid obesity with body mass index (BMI) of 40.0 to 49.9 ([place]) 10/13/2017  . Migraine headache without aura 10/13/2017   Social History   Socioeconomic History  . Marital status: Married    Spouse name: Not on file  . Number of children: 0  . Years of education: Not on file  . Highest education level: Not on file  Occupational History  . Not on file  Social Needs  . Financial resource strain: Not on file  . Food insecurity:    Worry: Not on file    Inability: Not on file  . Transportation needs:    Medical: Not on file    Non-medical: Not on file  Tobacco Use  . Smoking status: Never Smoker  . Smokeless tobacco: Never Used  Substance and Sexual Activity  . Alcohol use: No    Frequency: Never  . Drug use: No  . Sexual activity: Yes    Partners: Male  Lifestyle  . Physical activity:    Days per week: Not on file    Minutes per session: Not on file  . Stress: Not on file  Relationships  . Social connections:    Talks on phone: Not on file    Gets together: Not on file    Attends religious service: Not on file    Active member of club or organization: Not on file    Attends meetings of clubs or organizations: Not on file    Relationship status: Not on file  . Intimate partner violence:    Fear of current or ex partner: Not on file    Emotionally abused: Not on file    Physically abused: Not on file    Forced sexual activity: Not on file  Other Topics Concern  . Not on file  Social History Narrative  . Not on file    [name]'s family history includes Alcohol abuse in her maternal grandmother, paternal grandfather, and paternal grandmother; Arthritis in her maternal grandfather, maternal grandmother, and mother; Birth defects in her maternal grandfather, maternal  grandmother, and paternal grandmother; Cancer in her mother; Depression in her maternal grandmother and mother; Diabetes in her maternal grandmother; Early death in her father; Heart attack in her maternal grandfather; Hyperlipidemia in her maternal grandmother and mother; Hypertension in her maternal grandfather and maternal grandmother; Kidney disease in her maternal grandfather and maternal grandmother; 80 / [place] in her mother.      Objective:    Vitals:   12/05/17 1106  BP: 124/80  Pulse: 72    Physical Exam; well-developed white female in no acute distress, pleasant.  Blood pressure 124/80 pulse 72, height 5 foot 7, weight 311, BMI 48.7.  HEENT ;nontraumatic normocephalic EOMI PERRLA sclera anicteric, Oropharynx clear, neck supple, Cardiovascular; regular rate and rhythm with S1-S2 no murmur rub or gallop, Pulmonary;  clear bilaterally, Abdomen; obese, soft she has some mild rather generalized tenderness, she is more tender in the epigastrium and right upper quadrant there is no guarding or rebound no palpable mass or hepatosplenomegaly, she does have a diastases recti in the epigastrium, bowel sounds present, Rectal; exam not done, Extremities ;no clubbing cyanosis or edema skin warm dry, Neuro psych; alert and oriented, grossly nonfocal mood and affect appropriate       Assessment & Plan:   #50 47 year old white female with history of gastric ulcer with GI bleed January 2018 hospitalized in [place] felt secondary to NSAID use, biopsies negative for H. pylori. Patient has developed recurrent epigastric and right upper quadrant pain over the past several months.  She is back on NSAIDs is also been taking omeprazole 40 mg once daily. I am concerned she may have recurrent peptic ulcer disease or erosive gastropathy Or 2 status post cholecystectomy #3 prior diagnosis of IBS with alternating bowel habits persisting #4 intermittent small-volume  hematochezia, suspect secondary to internal hemorrhoids.  Patient has had prior colonoscopy x2. #5 eosinophilic esophagitis diagnosed with EGD January 2018.  Patient is never been treated with budesonide and has not stayed on PPI chronically.  She has not had formal food allergy testing though no she is allergic to tree nuts. Ongoing issues with intermittent solid food dysphagia  #6 morbid obesity #7.  Adult onset diabetes mellitus #8 PCOS #9  Fatty liver by history/rule [place]gins; #1 patient will sign a release and we will obtain copies of her prior colonoscopies and path reports and then determine if any interval need for follow-up colonoscopy #2 increase omeprazole to 40 mg p.o. twice daily #3 antireflux regimen #4.  Schedule for upper endoscopy with possible esophageal dilation with Dr. Havery Moros.  Procedure was discussed in detail with the patient including indications risks and benefits and she is agreeable to proceed. Suspect patient will need to budesonide suspension 2 mg twice daily, EGD is scheduled for next week, will hold until post EGD findings. #5 we will referred to lobe our allergy for food allergy testing #6 start trial of Robinul Forte 2 mg p.o. every morning for IBS symptoms #7.  Have cautioned patient to discontinue meloxicam if at all possible and to avoid NSAIDs #8 check hepatic panel sed rate TTG and IgA #9 schedule for upper abdominal ultrasound. Patient will need office follow-up with Dr. Havery Moros or myself after above work-up.   Amy S Esterwood PA-C 12/05/2017   Cc: Martinique, Betty G, MD

## 2017-12-05 NOTE — Patient Instructions (Signed)
Please go to the basement level to have your labs drawn.  We have sent the following medications to your pharmacy for you to pick up at your convenience: [place] 1. Omeprazole 40 mg 2. Robinul Forte 2 mg ( Glycopyrolate )   We have provided you with Anti-reflux information.  We will call you when we have the appointment with [facility]- regarding food allergies.  You have been scheduled for an abdominal ultrasound at [facility] (1st floor of hospital) on Tuesday 5-14 at 10:00 am Please arrive at 9:45 am to your appointment for registration. Make certain not to have anything to eat or drink after midnight to your appointment. Should you need to reschedule your appointment, please contact radiology at [phone]. This test typically takes about 30 minutes to perform.

## 2017-12-05 NOTE — Telephone Encounter (Signed)
Faxed records request to [facility] on 12/05/17 to fax# [phone]  mw

## 2017-12-05 NOTE — Telephone Encounter (Signed)
Faxed records request to [facility] on 12/05/17 to fax# [phone] mw

## 2017-12-05 NOTE — Telephone Encounter (Signed)
Called [facility].  Made an appointment for the patient Tuesday 12-30-2017.  1:45 PM.  Faxed over the office note from today and the patient's insurance card information and demographics. .  Called the patient and she is going to call them this afternoon and ask for another date. The patient has to work on 12-30-2017.

## 2017-12-08 LAB — TISSUE TRANSGLUTAMINASE, IGG: (tTG) Ab, IgG: 5 U/mL

## 2017-12-08 NOTE — Telephone Encounter (Signed)
I left her a message with test results.  She has her ultrasound tomorrow.

## 2017-12-09 ENCOUNTER — Ambulatory Visit (HOSPITAL_COMMUNITY)
Admission: RE | Admit: 2017-12-09 | Discharge: 2017-12-09 | Disposition: A | Discharge status: Home / Self Care | Payer: No Typology Code available for payment source | Source: Ambulatory Visit | Attending: Physician Assistant | Admitting: Physician Assistant

## 2017-12-09 ENCOUNTER — Ambulatory Visit (INDEPENDENT_AMBULATORY_CARE_PROVIDER_SITE_OTHER): Payer: No Typology Code available for payment source

## 2017-12-09 ENCOUNTER — Encounter

## 2017-12-09 ENCOUNTER — Ambulatory Visit (INDEPENDENT_AMBULATORY_CARE_PROVIDER_SITE_OTHER): Payer: No Typology Code available for payment source | Admitting: Physical Medicine and Rehabilitation

## 2017-12-09 ENCOUNTER — Encounter (INDEPENDENT_AMBULATORY_CARE_PROVIDER_SITE_OTHER): Payer: Self-pay | Admitting: Physical Medicine and Rehabilitation

## 2017-12-09 VITALS — BP 120/68 | HR 69 | Temp 98.0°F

## 2017-12-09 DIAGNOSIS — R1011 Right upper quadrant pain: Secondary | ICD-10-CM | POA: Diagnosis present

## 2017-12-09 DIAGNOSIS — M5416 Radiculopathy, lumbar region: Secondary | ICD-10-CM | POA: Diagnosis not present

## 2017-12-09 DIAGNOSIS — K922 Gastrointestinal hemorrhage, unspecified: Secondary | ICD-10-CM | POA: Diagnosis not present

## 2017-12-09 DIAGNOSIS — K200 Eosinophilic esophagitis: Secondary | ICD-10-CM | POA: Diagnosis not present

## 2017-12-09 DIAGNOSIS — Z9049 Acquired absence of other specified parts of digestive tract: Secondary | ICD-10-CM | POA: Diagnosis not present

## 2017-12-09 DIAGNOSIS — Z8719 Personal history of other diseases of the digestive system: Secondary | ICD-10-CM | POA: Insufficient documentation

## 2017-12-09 DIAGNOSIS — R1013 Epigastric pain: Secondary | ICD-10-CM | POA: Diagnosis not present

## 2017-12-09 DIAGNOSIS — K589 Irritable bowel syndrome without diarrhea: Secondary | ICD-10-CM | POA: Diagnosis not present

## 2017-12-09 DIAGNOSIS — Z8711 Personal history of peptic ulcer disease: Secondary | ICD-10-CM

## 2017-12-09 MED ORDER — METHYLPREDNISOLONE ACETATE 80 MG/ML IJ SUSP
80.0000 mg | Freq: Once | INTRAMUSCULAR | Status: AC
  Administered 2017-12-09: 80 mg

## 2017-12-09 NOTE — Patient Instructions (Signed)

## 2017-12-09 NOTE — Progress Notes (Signed)
 .  Numeric Pain Rating Scale and Functional Assessment Average Pain 8   In the last MONTH (on 0-10 scale) has pain interfered with the following?  1. General activity like being  able to carry out your everyday physical activities such as walking, climbing stairs, carrying groceries, or moving a chair?  Rating(5)   +Driver, -BT, -Dye Allergies.  

## 2017-12-11 NOTE — Telephone Encounter (Signed)
Rec'd medical records from [facility] - sending interoffice mail to [employer] 12/11/17  LM

## 2017-12-11 NOTE — Telephone Encounter (Signed)
Rec'd medical records from [facility] - sending interoffice to [employer] 12/11/17  LM

## 2017-12-12 ENCOUNTER — Ambulatory Visit (AMBULATORY_SURGERY_CENTER): Payer: No Typology Code available for payment source | Admitting: Gastroenterology

## 2017-12-12 ENCOUNTER — Encounter: Payer: Self-pay | Admitting: Gastroenterology

## 2017-12-12 ENCOUNTER — Other Ambulatory Visit: Payer: Self-pay

## 2017-12-12 VITALS — BP 125/69 | HR 67 | Temp 99.1°F | Resp 22 | Ht 67.0 in | Wt 311.0 lb

## 2017-12-12 DIAGNOSIS — K289 Gastrojejunal ulcer, unspecified as acute or chronic, without hemorrhage or perforation: Secondary | ICD-10-CM

## 2017-12-12 DIAGNOSIS — R1310 Dysphagia, unspecified: Secondary | ICD-10-CM

## 2017-12-12 DIAGNOSIS — K2950 Unspecified chronic gastritis without bleeding: Secondary | ICD-10-CM

## 2017-12-12 DIAGNOSIS — K222 Esophageal obstruction: Secondary | ICD-10-CM

## 2017-12-12 DIAGNOSIS — R1011 Right upper quadrant pain: Secondary | ICD-10-CM

## 2017-12-12 MED ORDER — SUCRALFATE 1 G PO TABS: 1.0000 g | ORAL_TABLET | Freq: Two times a day (BID) | ORAL | 1 refills | Status: DC

## 2017-12-12 MED ORDER — SODIUM CHLORIDE 0.9 % IV SOLN: 500.0000 mL | Freq: Once | INTRAVENOUS | Status: DC

## 2017-12-12 MED FILL — SUCRALFATE 1 GM TABLET: 1 | 30 days supply | Qty: 60 | Fill #0

## 2017-12-12 NOTE — Progress Notes (Signed)
Called to room to assist during endoscopic procedure.  Patient ID and intended procedure confirmed with present staff. Received instructions for my participation in the procedure from the performing physician.  

## 2017-12-12 NOTE — Progress Notes (Signed)
Report to PACU, RN, vss, BBS= Clear.  

## 2017-12-12 NOTE — Op Note (Signed)
Alexa West Patient Name: Alexa West Procedure Date: 12/12/2017 12:07 PM MRN: [id number] Endoscopist: Remo Lipps P. Armbruster MD, MD Age: 47 Referring MD:  Date of Birth: 1971/05/27 Gender: Female Account #: [id number] Procedure:                Upper GI endoscopy Indications:              Epigastric abdominal pain, Dysphagia - reported                            history of possible eosinophilic esophagitis,                            history of gastric ulcer in Jan 2018, history of                            NSAID use, on omeprazole 40mg  twice daily Medicines:                Monitored Anesthesia Care Procedure:                Pre-Anesthesia Assessment:                           - Prior to the procedure, a History and Physical                            was performed, and patient medications and                            allergies were reviewed. The patient's tolerance of                            previous anesthesia was also reviewed. The risks                            and benefits of the procedure and the sedation                            options and risks were discussed with the patient.                            All questions were answered, and informed consent                            was obtained. Prior Anticoagulants: The patient has                            taken no previous anticoagulant or antiplatelet                            agents. ASA Grade Assessment: III - A patient with                            severe systemic disease. After reviewing the risks  and benefits, the patient was deemed in                            satisfactory condition to undergo the procedure.                           After obtaining informed consent, the endoscope was                            passed under direct vision. Throughout the                            procedure, the patient's blood pressure, pulse, and                            oxygen  saturations were monitored continuously. The                            Model GIF-HQ190 [id number]) scope was introduced                            through the mouth, and advanced to the second part                            of duodenum. The upper GI endoscopy was                            accomplished without difficulty. The patient                            tolerated the procedure well. Scope In: Scope Out: Findings:                 Esophagogastric landmarks were identified: the                            Z-line was found at 38 cm, the gastroesophageal                            junction was found at 38 cm and the upper extent of                            the gastric folds was found at 38 cm from the                            incisors.                           One benign-appearing, intrinsic mild stenosis was                            found 38 cm from the incisors. This stenosis                            measured less  than one cm (in length). A TTS                            dilator was passed through the scope. Dilation with                            an 18-19-20 mm balloon dilator was performed to 18                            mm and 19 mm after which an appropriate mucosal                            wrent was noted.                           Mucosal changes including ringed esophagus were                            found in the upper third of the esophagus and in                            the middle third of the esophagus. Biopsies were                            obtained from the proximal and distal esophagus                            with cold forceps for histology of suspected                            eosinophilic esophagitis.                           One linear gastric ulcer with no stigmata of                            bleeding was found in the gastric antrum, appeared                            to be healing. The lesion was 4 mm in largest                             dimension. Biopsies were taken with a cold forceps                            for histology.                           The exam of the stomach was otherwise normal.                           Biopsies were taken with a cold forceps in the  gastric body, at the incisura and in the gastric                            antrum for Helicobacter pylori testing.                           The duodenal bulb and second portion of the                            duodenum were normal. Complications:            No immediate complications. Estimated blood loss:                            Minimal. Estimated Blood Loss:     Estimated blood loss was minimal. Impression:               - Esophagogastric landmarks identified.                           - Benign-appearing esophageal stenosis. Dilated to                            19mm with good result.                           - Esophageal mucosal changes suspicious for                            eosinophilic esophagitis. Biopsied.                           - Gastric ulcer with no stigmata of bleeding,                            appeared to be healing, no high risk stigmata.                            Biopsied.                           - Normal stomach otherwise, biopsies obtained to                            rule out H pylori                           - Normal duodenal bulb and second portion of the                            duodenum.                           Suspect ulcer could be related to NSAID use, will                            rule out H pylori. Findings otherwise suggestive  of                            EoE to cause dysphagia. Recommendation:           - Patient has a contact number available for                            emergencies. The signs and symptoms of potential                            delayed complications were discussed with the                            patient. Return to normal activities tomorrow.                             Written discharge instructions were provided to the                            patient.                           - Post-dilation diet.                           - Continue present medications.                           - Stop Mobic / NSAIDs                           - Add carafate 1gm BID                           - Continue omeprazole 40mg  twice daily                           - Await pathology results with further                            recommendations Remo Lipps P. Armbruster MD, MD 12/12/2017 12:36:51 PM This report has been signed electronically.

## 2017-12-12 NOTE — Patient Instructions (Addendum)
Biopsies taken today. Result letter in your mail in 2-3 weeks or phone call from your doctor. Continue current medications. Continue omeprazole 40 mg twice a day.  Stop Mobic/NSAIDS  Carafate 1 mg take 1 tablet twice a day.  Sent to your pharmacy.  Call us with any questions or concerns. Thank you!    YOU HAD AN ENDOSCOPIC PROCEDURE TODAY AT [facility]:   Refer to the procedure report that was given to you for any specific questions about what was found during the examination.  If the procedure report does not answer your questions, please call your gastroenterologist to clarify.  If you requested that your care partner not be given the details of your procedure findings, then the procedure report has been included in a sealed envelope for you to review at your convenience later.  YOU SHOULD EXPECT: Some feelings of bloating in the abdomen. Passage of more gas than usual.  Walking can help get rid of the air that was put into your GI tract during the procedure and reduce the bloating. If you had a lower endoscopy (such as a colonoscopy or flexible sigmoidoscopy) you may notice spotting of blood in your stool or on the toilet paper. If you underwent a bowel prep for your procedure, you may not have a normal bowel movement for a few days.  Please Note:  You might notice some irritation and congestion in your nose or some drainage.  This is from the oxygen used during your procedure.  There is no need for concern and it should clear up in a day or so.  SYMPTOMS TO REPORT IMMEDIATELY:   Following upper endoscopy (EGD)  Vomiting of blood or coffee ground material  New chest pain or pain under the shoulder blades  Painful or persistently difficult swallowing  New shortness of breath  Fever of 100F or higher  Black, tarry-looking stools  For urgent or emergent issues, a gastroenterologist can be reached at any hour by calling [phone].   DIET:  Nothing by mouth until 1:30  pm then clear liquids for 1 hour, then soft diet the rest of today. Resume your current diet tomorrow. See handout given to you on Post-dilation diet. Drink plenty of fluids but you should avoid alcoholic beverages for 24 hours.  ACTIVITY:  You should plan to take it easy for the rest of today and you should NOT DRIVE or use heavy machinery until tomorrow (because of the sedation medicines used during the test).    FOLLOW UP: Our staff will call the number listed on your records the next business day following your procedure to check on you and address any questions or concerns that you may have regarding the information given to you following your procedure. If we do not reach you, we will leave a message.  However, if you are feeling well and you are not experiencing any problems, there is no need to return our call.  We will assume that you have returned to your regular daily activities without incident.  If any biopsies were taken you will be contacted by phone or by letter within the next 1-3 weeks.  Please call us at [phone] if you have not heard about the biopsies in 3 weeks.    SIGNATURES/CONFIDENTIALITY: You and/or your care partner have signed paperwork which will be entered into your electronic medical record.  These signatures attest to the fact that that the information above on your After Visit Summary has been  reviewed and is understood.  Full responsibility of the confidentiality of this discharge information lies with you and/or your care-partner. 

## 2017-12-15 ENCOUNTER — Telehealth: Payer: Self-pay

## 2017-12-15 ENCOUNTER — Encounter (INDEPENDENT_AMBULATORY_CARE_PROVIDER_SITE_OTHER): Payer: No Typology Code available for payment source | Admitting: Physical Medicine and Rehabilitation

## 2017-12-15 NOTE — Telephone Encounter (Signed)
Attempted to reach patient for post-procedure f/u call. No answer. Left message that we will make another attempt to reach her again later today and for her to please not hesitate to call us if she has any questions/concerns regarding her care. 

## 2017-12-15 NOTE — Telephone Encounter (Signed)
Attempted to reach patient for post-procedure f/u call (2nd attempt). No answer. Left message that we hope she is doing well and for her to please not hesitate to call us if she has any questions/concerns regarding her care.

## 2017-12-18 ENCOUNTER — Encounter: Payer: Self-pay | Admitting: Gastroenterology

## 2017-12-19 ENCOUNTER — Encounter: Payer: Self-pay | Admitting: Podiatry

## 2017-12-19 ENCOUNTER — Ambulatory Visit: Payer: No Typology Code available for payment source | Admitting: Podiatry

## 2017-12-19 ENCOUNTER — Ambulatory Visit (INDEPENDENT_AMBULATORY_CARE_PROVIDER_SITE_OTHER): Payer: No Typology Code available for payment source

## 2017-12-19 VITALS — BP 128/62 | HR 61 | Resp 16

## 2017-12-19 DIAGNOSIS — M722 Plantar fascial fibromatosis: Secondary | ICD-10-CM

## 2017-12-19 DIAGNOSIS — M779 Enthesopathy, unspecified: Secondary | ICD-10-CM

## 2017-12-19 DIAGNOSIS — L600 Ingrowing nail: Secondary | ICD-10-CM | POA: Diagnosis not present

## 2017-12-19 MED ORDER — TRIAMCINOLONE ACETONIDE 10 MG/ML IJ SUSP
10.0000 mg | Freq: Once | INTRAMUSCULAR | Status: AC
  Administered 2017-12-19: 10 mg

## 2017-12-19 NOTE — Progress Notes (Signed)
Subjective:   Patient ID: Alexa West, female   DOB: 47 y.o.   MRN: [id number]   HPI Patient presents stating she is had a lot of pain in her feet for a lot of years and is worsened over the last few and she does have obesity which is a complicating factor.  She is had a history of fracture of the left ankle and her ankle become sore on both feet and she is had a long-term history of plantar fasciitis.  Also complains of an ingrown toenail on her right big toe lateral border that has been going on for a number of years.  Patient does not smoke and likes to be active   Review of Systems  All other systems reviewed and are negative.       Objective:  Physical Exam  Constitutional: She appears well-developed and well-nourished.  Cardiovascular: Intact distal pulses.  Pulmonary/Chest: Effort normal.  Musculoskeletal: Normal range of motion.  Neurological: She is alert.  Skin: Skin is warm.  Nursing note and vitals reviewed.   Neurovascular status found to be intact with muscle strength adequate range of motion within normal limits.  Patient does have discomfort in the sinus tarsi bilateral with inflammation fluid of the sinus tarsi and does have no restriction of ankle motion bilateral with indications of arthritis on the left side.  Mild plantar fasciitis and ingrown toenail right hallux lateral border that is painful when palpated.  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  She has had good success in the past and she is scanned for customized orthotic devices today.  I did inject the sinus tarsi bilateral 3 mg Kenalog 5 Milgram Xylocaine discussed correction of the ingrown toenail  she wants to get that done but cannot do today and will reappoint to have it fixed.  Patient will be seen by ped orthotist when orthotics are ready  X-rays indicate that there is some arthritis of the ankle joint left with no with no other indications of pathology

## 2017-12-19 NOTE — Progress Notes (Signed)
Alexa West - 47 y.o. female MRN [id number]  Date of birth: [birth year]  Office Visit Note: Visit Date: 12/09/2017 PCP: Martinique, Betty G, MD Referred by: Martinique, Betty G, MD  Subjective: Chief Complaint  Patient presents with  . Lower Back - Pain   HPI: [name] is a 47 year old female with chronic worsening right hip and leg pain.  She comes in today at the request of Dr. Louanne West forn right L5-S1 intralaminar epidural steroid injection.  She does have report in the chart of MRI from a few years ago showing disc protrusion on the right with some right foraminal narrowing at L5-S1.  X-rays were also reviewed which are more current.  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   ROS Otherwise per HPI.  Assessment & Plan: Visit Diagnoses:  1. Lumbar radiculopathy     Plan: No additional findings.   Meds & Orders:  Meds ordered this encounter  Medications  . methylPREDNISolone acetate (DEPO-MEDROL) injection 80 mg    Orders Placed This Encounter  Procedures  . XR C-ARM NO REPORT  . Epidural Steroid injection    Follow-up: Return if symptoms worsen or fail to improve, for Dr. Louanne West.   Procedures: No procedures performed  Lumbar Epidural Steroid Injection - Interlaminar Approach with Fluoroscopic Guidance  Patient: Alexa West      Date of Birth: 09/07/1970 MRN: [id number] PCP: Martinique, Betty G, MD      Visit Date: 12/09/2017   Universal Protocol:     Consent Given By: the patient  Position: PRONE  Additional Comments: Vital signs were monitored before and after the procedure. Patient was prepped and draped in the usual sterile fashion. The correct patient, procedure, and site was verified.   Injection Procedure Details:  Procedure Site One Meds Administered:  Meds ordered this encounter  Medications  . methylPREDNISolone acetate (DEPO-MEDROL)  injection 80 mg     Laterality: Right  Location/Site:  L5-S1  Needle size: 20 West  Needle type: Tuohy  Needle Placement: Paramedian epidural  Findings:   -Comments: Excellent flow of contrast into the epidural space.  Procedure Details: Using a paramedian approach from the side mentioned above, the region overlying the inferior lamina was localized under fluoroscopic visualization and the soft tissues overlying this structure were infiltrated with 4 ml. of 1% Lidocaine without Epinephrine. The Tuohy needle was inserted into the epidural space using a paramedian approach.   The epidural space was localized using loss of resistance along with lateral and bi-planar fluoroscopic views.  After negative aspirate for air, blood, and CSF, a 2 ml. volume of Isovue-250 was injected into the epidural space and the flow of contrast was observed. Radiographs were obtained for documentation purposes.    The injectate was administered into the level noted above.   Additional Comments:  The patient tolerated the procedure well Dressing: Band-Aid    Post-procedure details: Patient was observed during the procedure. Post-procedure instructions were reviewed.  Patient left the clinic in stable condition.   Clinical History: LUMBAR SPINE - COMPLETE 4+ VIEW  COMPARISON:  None.  FINDINGS: Alignment is normal. There is mild lower lumbar disc space narrowing, greatest at L5-S1. There is severe facet hypertrophy at L3-4, L4-5 and L5-S1. No acute fracture. Multiple anterior osteophytes of the lower thoracic and upper lumbar spine, and at L5-S1.  IMPRESSION: 1. No acute fracture or listhesis of the lumbar  spine. 2. Severe facet arthrosis at L3-S1, which may be a source of local low back pain.   Electronically Signed   By: Alexa West M.D.   On: 10/13/2017 15:11  MRI LUMBAR SPINE WITHOUT CONTRAST, Apr 24, 2014 03:09:12 PM . INDICATION: Spinal stenosis, unspecified region other  than cervical724.00 Spinal stenosis, u  COMPARISON: None . TECHNIQUE: Multiplanar, multi-sequence surface-coil MR imaging of the lumbar spine was performed without contrast. . LEVELS IMAGED: Lower thoracic to upper sacral region . FINDINGS: . Alignment: Normal. . Vertebrae: No marrow signal abnormalities to suggest neoplasm. . Soft Tissues: Unremarkable . Conus: In normal position without imaging evidence for tethering. Normal signal and contour.  . Degenerative disc disease: . T11-T12: Sagittal imaging only. No focal abnormality. . T12-L1: No focal abnormality. . L1-L2: Broad based bulge with superimposed left paracentral disc protrusion similar in appearance when compared to prior. Mild spinal stenosis. Mild facet arthropathy. . L2-L3: No focal abnormality. Mild facet arthropathy. . L3-L4: Minimal disc bulge but without stenosis. Mild facet arthropathy.  . L4-L5: Mild facet joint degenerative changes with left facet effusion. Marland Kitchen L5-S1: Mild increase in size of disc protrusion with moderate right and mild left foraminal stenosis. No spinal stenosis.  Marland Kitchen Upper Sacrum/Ilium: No focal abnormality. . CONCLUSION:  1. Mild increase in size of disc protrusion at L5-S1 with moderate right and mild left foraminal stenosis. 2. Similar appearance to broad-based disc bulge with superimposed left paracentral disc protrusion at L1-L2.  Marland Kitchen   She reports that she has never smoked. She has never used smokeless tobacco.  Recent Labs    10/13/17 1126  HGBA1C 5.6    Objective:  VS:  HT:    WT:   BMI:     BP:120/68  HR:69bpm  TEMP:98 F (36.7 C)(Oral)  RESP:98 % Physical Exam  Ortho Exam Imaging: No results found.  Past Medical/Family/Surgical/Social History: Medications & Allergies reviewed per EMR, new medications updated. Patient Active Problem List   Diagnosis Date Noted  . PCOS (polycystic ovarian syndrome) 12/05/2017  . History of IBS 12/05/2017  . S/P cholecystectomy 12/05/2017    . Gastric ulcer with hemorrhage 12/05/2017  . Eosinophilic esophagitis 94/17/4081  . Morbid obesity with body mass index (BMI) of 40.0 to 49.9 ([place]) 10/13/2017  . Migraine headache without aura 10/13/2017   Past Medical History:  Diagnosis Date  . Asthma   . Chicken pox   . Depression   . GERD (gastroesophageal reflux disease)   . History of diverticulitis   . History of genital warts   . Hx: UTI (urinary tract infection)   . Migraines   . Urine incontinence    Family History  Problem Relation Age of Onset  . Arthritis Mother   . Cancer Mother   . Depression Mother   . Hyperlipidemia Mother   . Miscarriages / [place] Mother   . Early death Father   . Alcohol abuse Maternal Grandmother   . Arthritis Maternal Grandmother   . Birth defects Maternal Grandmother   . Depression Maternal Grandmother   . Diabetes Maternal Grandmother   . Hyperlipidemia Maternal Grandmother   . Hypertension Maternal Grandmother   . Kidney disease Maternal Grandmother   . Arthritis Maternal Grandfather   . Birth defects Maternal Grandfather   . Hypertension Maternal Grandfather   . Kidney disease Maternal Grandfather   . Heart attack Maternal Grandfather   . Alcohol abuse Paternal Grandmother   . Birth defects Paternal Grandmother   . Alcohol abuse Paternal Grandfather  Past Surgical History:  Procedure Laterality Date  . CHOLECYSTECTOMY    . TONSILLECTOMY AND ADENOIDECTOMY     Social History   Occupational History  . Not on file  Tobacco Use  . Smoking status: Never Smoker  . Smokeless tobacco: Never Used  Substance and Sexual Activity  . Alcohol use: No    Frequency: Never  . Drug use: No  . Sexual activity: Yes    Partners: Male

## 2017-12-19 NOTE — Procedures (Signed)
Lumbar Epidural Steroid Injection - Interlaminar Approach with Fluoroscopic Guidance  Patient: Alexa West      Date of Birth: Dec 11, 1970 MRN: [id number] PCP: Martinique, Betty G, MD      Visit Date: 12/09/2017   Universal Protocol:     Consent Given By: the patient  Position: PRONE  Additional Comments: Vital signs were monitored before and after the procedure. Patient was prepped and draped in the usual sterile fashion. The correct patient, procedure, and site was verified.   Injection Procedure Details:  Procedure Site One Meds Administered:  Meds ordered this encounter  Medications  . methylPREDNISolone acetate (DEPO-MEDROL) injection 80 mg     Laterality: Right  Location/Site:  L5-S1  Needle size: 20 G  Needle type: Tuohy  Needle Placement: Paramedian epidural  Findings:   -Comments: Excellent flow of contrast into the epidural space.  Procedure Details: Using a paramedian approach from the side mentioned above, the region overlying the inferior lamina was localized under fluoroscopic visualization and the soft tissues overlying this structure were infiltrated with 4 ml. of 1% Lidocaine without Epinephrine. The Tuohy needle was inserted into the epidural space using a paramedian approach.   The epidural space was localized using loss of resistance along with lateral and bi-planar fluoroscopic views.  After negative aspirate for air, blood, and CSF, a 2 ml. volume of Isovue-250 was injected into the epidural space and the flow of contrast was observed. Radiographs were obtained for documentation purposes.    The injectate was administered into the level noted above.   Additional Comments:  The patient tolerated the procedure well Dressing: Band-Aid    Post-procedure details: Patient was observed during the procedure. Post-procedure instructions were reviewed.  Patient left the clinic in stable condition.

## 2017-12-23 ENCOUNTER — Encounter: Payer: Self-pay | Admitting: Gastroenterology

## 2018-01-02 ENCOUNTER — Ambulatory Visit (INDEPENDENT_AMBULATORY_CARE_PROVIDER_SITE_OTHER): Payer: No Typology Code available for payment source | Admitting: Family Medicine

## 2018-01-02 ENCOUNTER — Encounter: Payer: Self-pay | Admitting: Family Medicine

## 2018-01-02 VITALS — BP 136/84 | HR 65 | Temp 98.4°F | Resp 12 | Ht 65.75 in | Wt 315.4 lb

## 2018-01-02 DIAGNOSIS — Z131 Encounter for screening for diabetes mellitus: Secondary | ICD-10-CM

## 2018-01-02 DIAGNOSIS — Z889 Allergy status to unspecified drugs, medicaments and biological substances status: Secondary | ICD-10-CM | POA: Diagnosis not present

## 2018-01-02 DIAGNOSIS — Z1231 Encounter for screening mammogram for malignant neoplasm of breast: Secondary | ICD-10-CM

## 2018-01-02 DIAGNOSIS — Z13220 Encounter for screening for lipoid disorders: Secondary | ICD-10-CM | POA: Diagnosis not present

## 2018-01-02 DIAGNOSIS — Z114 Encounter for screening for human immunodeficiency virus [HIV]: Secondary | ICD-10-CM

## 2018-01-02 DIAGNOSIS — Z1239 Encounter for other screening for malignant neoplasm of breast: Secondary | ICD-10-CM

## 2018-01-02 DIAGNOSIS — M67431 Ganglion, right wrist: Secondary | ICD-10-CM

## 2018-01-02 DIAGNOSIS — R252 Cramp and spasm: Secondary | ICD-10-CM | POA: Diagnosis not present

## 2018-01-02 DIAGNOSIS — Z0000 Encounter for general adult medical examination without abnormal findings: Secondary | ICD-10-CM

## 2018-01-02 LAB — BASIC METABOLIC PANEL
BUN: 9 mg/dL (ref 6–23)
CO2: 26 meq/L (ref 19–32)
CREATININE: 0.67 mg/dL (ref 0.40–1.20)
Calcium: 8.5 mg/dL (ref 8.4–10.5)
Chloride: 102 mEq/L (ref 96–112)
GFR: 100.77 mL/min (ref >60.00)
GLUCOSE: 99 mg/dL (ref 70–99)
Potassium: 3.9 mEq/L (ref 3.5–5.1)
Sodium: 137 mEq/L (ref 135–145)

## 2018-01-02 LAB — CK: CK TOTAL: 16 U/L (ref 7–177)

## 2018-01-02 LAB — LIPID PANEL
CHOLESTEROL: 159 mg/dL (ref 0–200)
HDL: 46.7 mg/dL (ref >39.00)
LDL Cholesterol: 97 mg/dL (ref 0–99)
NONHDL: 112.65
Total CHOL/HDL Ratio: 3
Triglycerides: 76 mg/dL (ref 0.0–149.0)
VLDL: 15.2 mg/dL (ref 0.0–40.0)

## 2018-01-02 LAB — TSH: TSH: 4.67 u[IU]/mL — ABNORMAL HIGH (ref 0.35–4.50)

## 2018-01-02 NOTE — Progress Notes (Addendum)
HPI:   Alexa West is a 47 y.o. female, who is here today for her routine physical.  Last CPE: 2017  Regular exercise 3 or more time per week: 2-3 miles at work but she does not exercise regularly due to back pain. Usually she walks between 8000-10.000 steps. Following a healthy diet: Not consistently. She lives with her husband.  Chronic medical problems: Anxiety,migraine headache,back pain, joint pain, IFG,and abnormal LFT's among some.  Pap smear 2018 Hx of abnormal pap smears: 1997. Hx of STD's Denies   There is no immunization history on file for this patient.  Mammogram: 5 years ago Colonoscopy: N/A DEXA: N/A   She has some concerns today.  Neck pain, chronic for years,exacerbated by turning head. Getting worse. Pain is not radiated to UE,ni numbness or tingling. Exacerbated by movement and alleviated by rest. Following with ortho for lower back pain with radiation.  "Lump" in rigt wrist, occasonally pain radiated to forearm. She has had it for years,slowly growing. No limiting wrist ROM but her husband thinks "it is ugly."   LE cramps at night when in bed most of the time, bilateral,L>R. This has ben going on for a couple of months. Last a bout 30 min. No associated edema,erythema,or cold extremity.  She has not identified exacerbating or alleviating factors.   Referral to immunologist. According to patient, GI suggested probability of food allergies, she had food allergy test and according to patient, she was told she does not have it but she does "not believe" them, so she would like to see an immunologist to be evaluated in this regard. She also reports history of intermittent wheezing and seasonal allergies.    Review of Systems  Constitutional: Positive for fatigue. Negative for appetite change and fever.  HENT: Negative for dental problem, hearing loss, mouth sores, sore throat, trouble swallowing and voice change.   Eyes:  Negative for redness and visual disturbance.  Respiratory: Negative for cough, shortness of breath and wheezing.   Cardiovascular: Negative for chest pain and leg swelling.  Gastrointestinal: Negative for abdominal pain, nausea and vomiting.       No changes in bowel habits.  Endocrine: Negative for cold intolerance, heat intolerance, polydipsia, polyphagia and polyuria.  Genitourinary: Negative for decreased urine volume, dysuria, hematuria, vaginal bleeding and vaginal discharge.  Musculoskeletal: Positive for arthralgias, back pain, myalgias and neck pain.  Skin: Negative for color change and rash.  Allergic/Immunologic: Positive for environmental allergies.  Neurological: Negative for syncope, weakness and headaches.  Hematological: Negative for adenopathy. Does not bruise/bleed easily.  Psychiatric/Behavioral: Negative for confusion and sleep disturbance. The patient is nervous/anxious.   All other systems reviewed and are negative.     Current Outpatient Medications on File Prior to Visit  Medication Sig Dispense Refill  . albuterol (VENTOLIN HFA) 108 (90 Base) MCG/ACT inhaler as needed.    Marland Kitchen glycopyrrolate (ROBINUL) 2 MG tablet Take 1 tab by mouth every morning. 30 tablet 6  . methocarbamol (ROBAXIN) 500 MG tablet TAKE 1 TABLET BY MOUTH EVERY 8 HOURS AS NEEDED FOR MUSCLE SPASMS. 45 tablet 1  . omeprazole (PRILOSEC) 40 MG capsule Take 1 tab by mouth twice daily. 60 capsule 11  . rizatriptan (MAXALT-MLT) 10 MG disintegrating tablet Take 1 tablet (10 mg total) by mouth as needed for migraine. May repeat in 2 hours if needed 10 tablet 3  . sucralfate (CARAFATE) 1 g tablet Take 1 tablet (1 g total) by mouth 2 (two) times  daily. 60 tablet 1   No current facility-administered medications on file prior to visit.      Past Medical History:  Diagnosis Date  . Asthma   . Chicken pox   . Depression   . GERD (gastroesophageal reflux disease)   . History of diverticulitis   . History  of genital warts   . Hx: UTI (urinary tract infection)   . Migraines   . Urine incontinence     Past Surgical History:  Procedure Laterality Date  . CHOLECYSTECTOMY    . TONSILLECTOMY AND ADENOIDECTOMY      Allergies  Allergen Reactions  . Cephalosporins Hives and Rash  . Penicillins Hives and Rash    Family History  Problem Relation Age of Onset  . Arthritis Mother   . Cancer Mother   . Depression Mother   . Hyperlipidemia Mother   . Miscarriages / [place] Mother   . Early death Father   . Alcohol abuse Maternal Grandmother   . Arthritis Maternal Grandmother   . Birth defects Maternal Grandmother   . Depression Maternal Grandmother   . Diabetes Maternal Grandmother   . Hyperlipidemia Maternal Grandmother   . Hypertension Maternal Grandmother   . Kidney disease Maternal Grandmother   . Arthritis Maternal Grandfather   . Birth defects Maternal Grandfather   . Hypertension Maternal Grandfather   . Kidney disease Maternal Grandfather   . Heart attack Maternal Grandfather   . Alcohol abuse Paternal Grandmother   . Birth defects Paternal Grandmother   . Alcohol abuse Paternal Grandfather     Social History   Socioeconomic History  . Marital status: Married    Spouse name: Not on file  . Number of children: 0  . Years of education: Not on file  . Highest education level: Not on file  Occupational History  . Not on file  Social Needs  . Financial resource strain: Not on file  . Food insecurity:    Worry: Not on file    Inability: Not on file  . Transportation needs:    Medical: Not on file    Non-medical: Not on file  Tobacco Use  . Smoking status: Never Smoker  . Smokeless tobacco: Never Used  Substance and Sexual Activity  . Alcohol use: No    Frequency: Never  . Drug use: No  . Sexual activity: Yes    Partners: Male  Lifestyle  . Physical activity:    Days per week: Not on file    Minutes per session: Not on file  . Stress: Not on file    Relationships  . Social connections:    Talks on phone: Not on file    Gets together: Not on file    Attends religious service: Not on file    Active member of club or organization: Not on file    Attends meetings of clubs or organizations: Not on file    Relationship status: Not on file  Other Topics Concern  . Not on file  Social History Narrative  . Not on file     Vitals:   01/02/18 0659  BP: 136/84  Pulse: 65  Resp: 12  Temp: 98.4 F (36.9 C)  SpO2: 98%   Body mass index is 51.29 kg/m.   Wt Readings from Last 3 Encounters:  01/02/18 (!) 315 lb 6.4 oz (143.1 kg)  12/12/17 (!) 311 lb (141.1 kg)  12/05/17 (!) 311 lb 2 oz (141.1 kg)      Physical Exam  Nursing note and vitals reviewed. Constitutional: She is oriented to person, place, and time. She appears well-developed. No distress.  HENT:  Head: Normocephalic and atraumatic.  Right Ear: Hearing, tympanic membrane, external ear and ear canal normal.  Left Ear: Hearing, tympanic membrane, external ear and ear canal normal.  Mouth/Throat: Uvula is midline, oropharynx is clear and moist and mucous membranes are normal.  Eyes: Pupils are equal, round, and reactive to light. Conjunctivae and EOM are normal.  Neck: No tracheal deviation present. No thyroid mass present.  Cardiovascular: Normal rate and regular rhythm.  No murmur heard. Pulses:      Dorsalis pedis pulses are 2+ on the right side, and 2+ on the left side.  Respiratory: Effort normal and breath sounds normal. No respiratory distress.  GI: Soft. She exhibits no mass. There is no hepatomegaly. There is no tenderness.  Genitourinary:  Genitourinary Comments: Breast: Fibrocystic-like changes upper outer quadrants bilateral, no masses, no skin abnormalities, and no nipple discharge.  Musculoskeletal: She exhibits no edema.       Cervical back: She exhibits spasm. She exhibits normal range of motion, no tenderness and no bony tenderness.  Nodular lesion  dorsal aspect of right wrist, about 3 cm, nontender, defined borders, no skin changes. No signs of synovitis appreciated.  Lymphadenopathy:    She has no cervical adenopathy.    She has no axillary adenopathy.       Right: No supraclavicular adenopathy present.       Left: No supraclavicular adenopathy present.  Neurological: She is alert and oriented to person, place, and time. She has normal strength. No cranial nerve deficit. Gait normal.  Reflex Scores:      Bicep reflexes are 2+ on the right side and 2+ on the left side.      Patellar reflexes are 2+ on the right side and 2+ on the left side. Skin: Skin is warm. No rash noted. No erythema.  Psychiatric: Her mood appears anxious. Cognition and memory are normal.  Well groomed, good eye contact.      ASSESSMENT AND PLAN:  Ms. Araina Butrick was here today annual physical examination.   Orders Placed This Encounter  Procedures  . MM 3D SCREEN BREAST BILATERAL  . Lipid panel  . Basic metabolic panel  . TSH  . HIV antibody  . CK  . Ambulatory referral to Immunology    Lab Results  Component Value Date   TSH 4.67 (H) 01/02/2018   Lab Results  Component Value Date   CHOL 159 01/02/2018   HDL 46.70 01/02/2018   LDLCALC 97 01/02/2018   TRIG 76.0 01/02/2018   CHOLHDL 3 01/02/2018   Lab Results  Component Value Date   CREATININE 0.67 01/02/2018   BUN 9 01/02/2018   NA 137 01/02/2018   K 3.9 01/02/2018   CL 102 01/02/2018   CO2 26 01/02/2018     Routine general medical examination at a health care facility  We discussed the importance of regular physical activity and healthy diet for prevention of chronic illness and/or complications. Preventive guidelines reviewed. She does not want pap smear done today,she is going to establish with gyn.  Vaccination up to date per pt report.She had vaccination updated at the time of employment with [employer] early this year.  Next CPE in a year.  Screening for lipid  disorders -     Lipid panel  Diabetes mellitus screening -     Basic metabolic panel  Muscle cramp  Possible causes discussed. Further recommendations will be given according to lab results.  In regard to cervicalgia,ROM exercises may help. Continue following with ortho.  -     TSH -     CK  Encounter for screening for HIV -     HIV antibody  Multiple allergies  She also has Hx of asthma and allergic rhinitis. Immunology referral placed as requested.  -     Ambulatory referral to Immunology  Ganglion, right wrist  She follows with ortho. She will let me know if she wants referral to hand specialist.  Breast cancer screening -     MM 3D SCREEN BREAST BILATERAL; Future      Return in 1 year (on 01/03/2019) for CPE.      Delberta Folts G. Martinique, MD  [facility]. [place] office.

## 2018-01-02 NOTE — Patient Instructions (Addendum)
A few things to remember from today's visit:   Routine general medical examination at a health care facility  Screening for lipid disorders - Plan: Lipid panel, Basic metabolic panel  Diabetes mellitus screening  Muscle cramp - Plan: TSH  Encounter for screening for HIV - Plan: HIV antibody  Multiple allergies - Plan: Ambulatory referral to Immunology  Ganglion, right wrist  Breast cancer screening - Plan: MM 3D SCREEN BREAST BILATERAL  Today you have you routine preventive visit.  At least 150 minutes of moderate exercise per week, daily brisk walking for 15-30 min is a good exercise option. Healthy diet low in saturated (animal) fats and sweets and consisting of fresh fruits and vegetables, lean meats such as fish and white chicken and whole grains.  These are some of recommendations for screening depending of age and risk factors:   - Vaccines:  Tdap vaccine every 10 years.  Shingles vaccine recommended at age 21, could be given after 46 years of age but not sure about insurance coverage.   Pneumonia vaccines:  Prevnar 13 at 65 and Pneumovax at 72. Sometimes Pneumovax is giving earlier if history of smoking, lung disease,diabetes,kidney disease among some.    Screening for diabetes at age 46 and every 3 years.  Cervical cancer prevention:  Pap smear starts at 46 years of age and continues periodically until 46 years old in low risk women. Pap smear every 3 years between 27 and 46 years old. Pap smear every 3-5 years between women 46 and older if pap smear negative and HPV screening negative.   -Breast cancer: Mammogram: There is disagreement between experts about when to start screening in low risk asymptomatic female but recent recommendations are to start screening at 46 and not later than 46 years old , every 1-2 years and after 46 yo q 2 years. Screening is recommended until 46 years old but some women can continue screening depending of healthy issues.   Colon  cancer screening: starts at 46 years old until 46 years old.  Cholesterol disorder screening at age 46 and every 3 years.  Also recommended:  1. Dental visit- Brush and floss your teeth twice daily; visit your dentist twice a year. 2. Eye doctor- Get an eye exam at least every 2 years. 3. Helmet use- Always wear a helmet when riding a bicycle, motorcycle, rollerblading or skateboarding. 4. Safe sex- If you may be exposed to sexually transmitted infections, use a condom. 5. Seat belts- Seat belts can save your live; always wear one. 6. Smoke/Carbon Monoxide detectors- These detectors need to be installed on the appropriate level of your home. Replace batteries at least once a year. 7. Skin cancer- When out in the sun please cover up and use sunscreen 15 SPF or higher. 8. Violence- If anyone is threatening or hurting you, please tell your healthcare provider.  9. Drink alcohol in moderation- Limit alcohol intake to one drink or less per day. Never drink and drive.  Please be sure medication list is accurate. If a new problem present, please set up appointment sooner than planned today.

## 2018-01-03 LAB — HIV ANTIBODY (ROUTINE TESTING W REFLEX): HIV: NONREACTIVE

## 2018-01-04 ENCOUNTER — Encounter: Payer: Self-pay | Admitting: Family Medicine

## 2018-01-08 ENCOUNTER — Ambulatory Visit (INDEPENDENT_AMBULATORY_CARE_PROVIDER_SITE_OTHER): Payer: No Typology Code available for payment source | Admitting: Orthopaedic Surgery

## 2018-01-08 ENCOUNTER — Encounter (INDEPENDENT_AMBULATORY_CARE_PROVIDER_SITE_OTHER): Payer: Self-pay | Admitting: Orthopaedic Surgery

## 2018-01-08 ENCOUNTER — Other Ambulatory Visit: Payer: Self-pay | Admitting: Family Medicine

## 2018-01-08 ENCOUNTER — Ambulatory Visit (INDEPENDENT_AMBULATORY_CARE_PROVIDER_SITE_OTHER): Payer: No Typology Code available for payment source

## 2018-01-08 DIAGNOSIS — M67431 Ganglion, right wrist: Secondary | ICD-10-CM

## 2018-01-08 DIAGNOSIS — M549 Dorsalgia, unspecified: Secondary | ICD-10-CM

## 2018-01-08 DIAGNOSIS — Z6841 Body Mass Index (BMI) 40.0 and over, adult: Secondary | ICD-10-CM | POA: Diagnosis not present

## 2018-01-08 DIAGNOSIS — M25552 Pain in left hip: Secondary | ICD-10-CM

## 2018-01-08 MED ORDER — BUPIVACAINE HCL 0.5 % IJ SOLN
0.3300 mL | INTRAMUSCULAR | Status: AC | PRN
  Administered 2018-01-08: .33 mL

## 2018-01-08 MED ORDER — LIDOCAINE HCL 1 % IJ SOLN
0.3000 mL | INTRAMUSCULAR | Status: AC | PRN
  Administered 2018-01-08: .3 mL

## 2018-01-08 MED ORDER — METHYLPREDNISOLONE ACETATE 40 MG/ML IJ SUSP
13.3300 mg | INTRAMUSCULAR | Status: AC | PRN
  Administered 2018-01-08: 13.33 mg

## 2018-01-08 MED FILL — GLYCOPYRROLATE 2 MG TABLET: 2 | 30 days supply | Qty: 30 | Fill #1

## 2018-01-08 MED FILL — METHOCARBAMOL 500 MG TABS: 500 | 15 days supply | Qty: 45 | Fill #1

## 2018-01-08 NOTE — Progress Notes (Signed)
Office Visit Note   Patient: Alexa West           Date of Birth: 03/22/1971           MRN: [id number] Visit Date: 01/08/2018              Requested by: Martinique, Betty G, MD [id number] [place], [place] [address] PCP: Martinique, Betty G, MD   Assessment & Plan: Visit Diagnoses:  1. Ganglion cyst of wrist, right   2. Body mass index 50.0-59.9, adult ([place])   3. Morbid obesity ([place])     Plan: Impression is right wrist dorsal ganglion cyst.  We discussed treatment options today.  We proceeded with an aspiration injection in the office today.  Patient tolerated as well.  I would like her to wear the wrist brace for 2 weeks.  Wean as tolerated after that.  Questions encouraged and answered.  Follow-up as needed.  Follow-Up Instructions: Return if symptoms worsen or fail to improve.   Orders:  Orders Placed This Encounter  Procedures  . XR Wrist Complete Right   No orders of the defined types were placed in this encounter.     Procedures: Hand/UE Inj: R wrist for dorsal carpal ganglion on 01/08/2018 10:15 AM Indications: pain Details: 18 G needle, dorsal approach Medications: 0.33 mL bupivacaine 0.5 %; 0.3 mL lidocaine 1 %; 13.33 mg methylPREDNISolone acetate 40 MG/ML Aspirate: 5 mL clear Outcome: tolerated well, no immediate complications Patient was prepped and draped in the usual sterile fashion.       Clinical Data: No additional findings.   Subjective: Chief Complaint  Patient presents with  . Right Wrist - Pain, Follow-up     Alexa West is a 47 year old female who comes in with a 1 year history of a dorsal right wrist ganglion cyst.  Denies any injuries.  She is right-hand dominant.  She works as a Occupational psychologist at [employer].  Denies any numbness and tingling.  She will occasionally get a radiating pain up into the forearm but this is not constant.   Review of Systems  Constitutional: Negative.   HENT: Negative.   Eyes: Negative.     Respiratory: Negative.   Cardiovascular: Negative.   Endocrine: Negative.   Musculoskeletal: Negative.   Neurological: Negative.   Hematological: Negative.   Psychiatric/Behavioral: Negative.   All other systems reviewed and are negative.    Objective: Vital Signs: LMP 12/17/2017 (Exact Date)   Physical Exam  Constitutional: She is oriented to person, place, and time. She appears well-developed and well-nourished.  HENT:  Head: Normocephalic and atraumatic.  Eyes: EOM are normal.  Neck: Neck supple.  Pulmonary/Chest: Effort normal.  Abdominal: Soft.  Neurological: She is alert and oriented to person, place, and time.  Skin: Skin is warm. Capillary refill takes less than 2 seconds.  Psychiatric: She has a normal mood and affect. Her behavior is normal. Judgment and thought content normal.  Nursing note and vitals reviewed.   Ortho Exam Right wrist exam shows a palpable dorsalGanglion cyst.  No aggressive features.  No overlying skin changes.  Neurovascular intact. Specialty Comments:  No specialty comments available.  Imaging: Xr Wrist Complete Right  Result Date: 01/08/2018 No acute or structural abnormalities    PMFS History: Patient Active Problem List   Diagnosis Date Noted  . PCOS (polycystic ovarian syndrome) 12/05/2017  . History of IBS 12/05/2017  . S/P cholecystectomy 12/05/2017  . Gastric ulcer with hemorrhage 12/05/2017  . Eosinophilic  esophagitis 12/05/2017  . Morbid obesity with body mass index (BMI) of 40.0 to 49.9 ([place]) 10/13/2017  . Migraine headache without aura 10/13/2017  . GI bleed 08/20/2016  . Transaminitis 08/20/2016  . Prediabetes 08/20/2016  . Bilateral low back pain without sciatica 05/24/2014  . Impaired fasting glucose 09/18/2010  . Depression 03/04/2008  . Esophageal reflux 03/04/2008   Past Medical History:  Diagnosis Date  . Asthma   . Chicken pox   . Depression   . GERD (gastroesophageal reflux disease)   . History of  diverticulitis   . History of genital warts   . Hx: UTI (urinary tract infection)   . Migraines   . Urine incontinence     Family History  Problem Relation Age of Onset  . Arthritis Mother   . Cancer Mother   . Depression Mother   . Hyperlipidemia Mother   . Miscarriages / [place] Mother   . Early death Father   . Alcohol abuse Maternal Grandmother   . Arthritis Maternal Grandmother   . Birth defects Maternal Grandmother   . Depression Maternal Grandmother   . Diabetes Maternal Grandmother   . Hyperlipidemia Maternal Grandmother   . Hypertension Maternal Grandmother   . Kidney disease Maternal Grandmother   . Arthritis Maternal Grandfather   . Birth defects Maternal Grandfather   . Hypertension Maternal Grandfather   . Kidney disease Maternal Grandfather   . Heart attack Maternal Grandfather   . Alcohol abuse Paternal Grandmother   . Birth defects Paternal Grandmother   . Alcohol abuse Paternal Grandfather     Past Surgical History:  Procedure Laterality Date  . CHOLECYSTECTOMY    . TONSILLECTOMY AND ADENOIDECTOMY     Social History   Occupational History  . Not on file  Tobacco Use  . Smoking status: Never Smoker  . Smokeless tobacco: Never Used  Substance and Sexual Activity  . Alcohol use: No    Frequency: Never  . Drug use: No  . Sexual activity: Yes    Partners: Male

## 2018-01-12 ENCOUNTER — Other Ambulatory Visit: Payer: No Typology Code available for payment source | Admitting: Orthotics

## 2018-01-13 ENCOUNTER — Other Ambulatory Visit: Payer: Self-pay | Admitting: Family Medicine

## 2018-01-13 ENCOUNTER — Encounter: Payer: No Typology Code available for payment source | Admitting: Family Medicine

## 2018-01-13 DIAGNOSIS — E039 Hypothyroidism, unspecified: Secondary | ICD-10-CM

## 2018-01-13 MED ORDER — LEVOTHYROXINE SODIUM 25 MCG PO TABS: 25.0000 ug | ORAL_TABLET | Freq: Every day | ORAL | 2 refills | Status: DC

## 2018-01-13 MED FILL — MELOXICAM 15 MG TABLET: 15 | 30 days supply | Qty: 30 | Fill #0

## 2018-01-13 MED FILL — LEVOTHYROXINE 25 MCG TABLET: 25 | 30 days supply | Qty: 30 | Fill #0

## 2018-01-14 MED FILL — OMEPRAZOLE DR 40 MG CAPSULE: 40 | 30 days supply | Qty: 60 | Fill #1

## 2018-01-15 ENCOUNTER — Ambulatory Visit: Payer: No Typology Code available for payment source | Admitting: Orthotics

## 2018-01-15 DIAGNOSIS — M722 Plantar fascial fibromatosis: Secondary | ICD-10-CM

## 2018-01-15 NOTE — Progress Notes (Signed)
Patient came in today to pick up custom made foot orthotics.  The goals were accomplished and the patient reported no dissatisfaction with said orthotics.  Patient was advised of breakin period and how to report any issues. 

## 2018-02-02 ENCOUNTER — Telehealth: Payer: Self-pay | Admitting: *Deleted

## 2018-02-02 NOTE — Telephone Encounter (Signed)
Received records from [facility], [place], [place], [place]. Office note, EGD and pathology report. 08/21/2016. Stamped and sent to be scanned  02-02-2018.

## 2018-02-04 ENCOUNTER — Other Ambulatory Visit: Payer: Self-pay | Admitting: Family Medicine

## 2018-02-04 DIAGNOSIS — M545 Low back pain, unspecified: Secondary | ICD-10-CM

## 2018-02-05 ENCOUNTER — Encounter: Payer: Self-pay | Admitting: Family Medicine

## 2018-02-06 MED FILL — METHOCARBAMOL 500 MG TABS: 500 | 15 days supply | Qty: 45 | Fill #0

## 2018-02-10 ENCOUNTER — Telehealth: Payer: Self-pay

## 2018-02-10 NOTE — Telephone Encounter (Signed)
Copied from [place] [id number]. Topic: Quick Communication - See Telephone Encounter >> Feb 10, 2018  1:20 PM Hewitt Shorts wrote: Pt is needing to talk with someone about the injection she got for her hip pain back in April when she first saw Dr. Martinique she did not remember the name but she is now wanting to know if she can get the same shot in the other hip and does she need an office visit or a nurse visit   [phone] ok to leave a message

## 2018-02-10 NOTE — Telephone Encounter (Signed)
Please advise  Toradol 60mg  was given on 11/05/17, see OV encounter

## 2018-02-10 NOTE — Telephone Encounter (Signed)
Spoke with patient and she scheduled ov for 02/11/18 to discuss hip pain.

## 2018-02-11 ENCOUNTER — Encounter: Payer: Self-pay | Admitting: Family Medicine

## 2018-02-11 ENCOUNTER — Ambulatory Visit (INDEPENDENT_AMBULATORY_CARE_PROVIDER_SITE_OTHER): Payer: No Typology Code available for payment source | Admitting: Family Medicine

## 2018-02-11 VITALS — BP 124/82 | HR 76 | Temp 98.1°F | Resp 12 | Ht 65.75 in | Wt 319.2 lb

## 2018-02-11 DIAGNOSIS — M25552 Pain in left hip: Secondary | ICD-10-CM

## 2018-02-11 DIAGNOSIS — M545 Low back pain: Secondary | ICD-10-CM

## 2018-02-11 DIAGNOSIS — M5416 Radiculopathy, lumbar region: Secondary | ICD-10-CM

## 2018-02-11 DIAGNOSIS — G8929 Other chronic pain: Secondary | ICD-10-CM | POA: Diagnosis not present

## 2018-02-11 DIAGNOSIS — IMO0001 Reserved for inherently not codable concepts without codable children: Secondary | ICD-10-CM

## 2018-02-11 MED ORDER — TRAMADOL HCL 50 MG PO TABS: 50.0000 mg | ORAL_TABLET | Freq: Three times a day (TID) | ORAL | 0 refills | Status: AC | PRN

## 2018-02-11 MED ORDER — GABAPENTIN 100 MG PO CAPS: 300.0000 mg | ORAL_CAPSULE | Freq: Three times a day (TID) | ORAL | 1 refills | Status: DC

## 2018-02-11 MED ORDER — DICLOFENAC SODIUM 1 % TD GEL: 4.0000 g | Freq: Four times a day (QID) | TRANSDERMAL | 3 refills | Status: DC

## 2018-02-11 MED FILL — DICLOFENAC SODIUM 1% GEL: 1 | 25 days supply | Qty: 400 | Fill #0

## 2018-02-11 MED FILL — GABAPENTIN 100 MG CAP: 100 | 10 days supply | Qty: 90 | Fill #0

## 2018-02-11 MED FILL — traMADol HCL 50 MG TABS: 50 | 7 days supply | Qty: 21 | Fill #0

## 2018-02-11 NOTE — Patient Instructions (Addendum)
A few things to remember from today's visit:   Left hip pain - Plan: traMADol (ULTRAM) 50 MG tablet, diclofenac sodium (VOLTAREN) 1 % GEL, gabapentin (NEURONTIN) 100 MG capsule  Please arrange appointment with orthopedics. Gabapentin 1 tablet at bedtime for3 days then increase it to 2 capsules then 3 capsules.   Please be sure medication list is accurate.

## 2018-02-11 NOTE — Assessment & Plan Note (Signed)
Chronic. It has has improved some. This problem could be causing left hip and thigh pain. Recommend continue following with orthopedist.

## 2018-02-11 NOTE — Progress Notes (Signed)
ACUTE VISIT   HPI:  Chief Complaint  Patient presents with  . Left hip pain    pain in left hip/buttock that radiates to thigh and calf started a couple of weeks ago    Alexa West is a 47 y.o. female, who is here today complaining of worsening left hip pain for the past couple weeks. She has history of OA, has had this pain before but not as bad as she has it now.  Pain posterior and lateral aspect radiated to distal LE. Pain is constant, sharp, 9/10.  It is exacerbated by walking and alleviated by sitting. Negative for edema or erythema.  She cannot sleep on the left side because it causes left knee pain and hip pain. No recent injuries.  No new numbness or tingling on affected area.  She states that she did  Not call ortho's office because she could not get an appt sooner.   She has history of back pain with radiation and intermittent numbness of both LEs depending of position she sits.She has followed with orthopedist. Received epidural injection 12/09/17, back pain has improved. She is still on Methocarbamol 500 mg tid as needed. Negative for changes in urine or bowel incontinence.  She is taking Mobic, which she does not seem like helping.   She has history of GI bleed and according to patient GI instructed her not to take Mobic but she is still taking because of pain.  Negative associated for fever,chills,abdominal pain,changes in bowel habits,or urinary symptoms.    Review of Systems  Constitutional: Negative for chills and fever.  Respiratory: Negative for cough, shortness of breath and wheezing.   Cardiovascular: Negative for chest pain and leg swelling.  Gastrointestinal: Negative for abdominal pain, nausea and vomiting.  Genitourinary: Negative for decreased urine volume, dysuria and hematuria.  Musculoskeletal: Positive for arthralgias. Negative for gait problem and joint swelling.  Skin: Negative for rash and wound.  Neurological: Positive  for numbness. Negative for weakness.  Psychiatric/Behavioral: Positive for sleep disturbance. Negative for confusion. The patient is nervous/anxious.     Current Outpatient Medications on File Prior to Visit  Medication Sig Dispense Refill  . albuterol (VENTOLIN HFA) 108 (90 Base) MCG/ACT inhaler as needed.    Marland Kitchen glycopyrrolate (ROBINUL) 2 MG tablet Take 1 tab by mouth every morning. 30 tablet 6  . levothyroxine (SYNTHROID, LEVOTHROID) 25 MCG tablet Take 1 tablet (25 mcg total) by mouth daily before breakfast. 30 tablet 2  . methocarbamol (ROBAXIN) 500 MG tablet TAKE 1 TABLET BY MOUTH EVERY 8 HOURS AS NEEDED FOR MUSCLE SPASMS. 45 tablet 0  . omeprazole (PRILOSEC) 40 MG capsule Take 1 tab by mouth twice daily. 60 capsule 11  . rizatriptan (MAXALT-MLT) 10 MG disintegrating tablet Take 1 tablet (10 mg total) by mouth as needed for migraine. May repeat in 2 hours if needed 10 tablet 3  . sucralfate (CARAFATE) 1 g tablet Take 1 tablet (1 g total) by mouth 2 (two) times daily. 60 tablet 1  . AUVI-Q 0.3 MG/0.3ML SOAJ injection     . levocetirizine (XYZAL) 5 MG tablet TAKE 1 TABLET BY MOUTH EVERY EVENING AS NEEDED, haven't started yet  5   No current facility-administered medications on file prior to visit.      Past Medical History:  Diagnosis Date  . Asthma   . Chicken pox   . Depression   . GERD (gastroesophageal reflux disease)   . History of diverticulitis   .  History of genital warts   . Hx: UTI (urinary tract infection)   . Migraines   . Urine incontinence    Allergies  Allergen Reactions  . Cephalosporins Hives and Rash  . Penicillins Hives and Rash    Social History   Socioeconomic History  . Marital status: Married    Spouse name: Not on file  . Number of children: 0  . Years of education: Not on file  . Highest education level: Not on file  Occupational History  . Not on file  Social Needs  . Financial resource strain: Not on file  . Food insecurity:    Worry: Not  on file    Inability: Not on file  . Transportation needs:    Medical: Not on file    Non-medical: Not on file  Tobacco Use  . Smoking status: Never Smoker  . Smokeless tobacco: Never Used  Substance and Sexual Activity  . Alcohol use: No    Frequency: Never  . Drug use: No  . Sexual activity: Yes    Partners: Male  Lifestyle  . Physical activity:    Days per week: Not on file    Minutes per session: Not on file  . Stress: Not on file  Relationships  . Social connections:    Talks on phone: Not on file    Gets together: Not on file    Attends religious service: Not on file    Active member of club or organization: Not on file    Attends meetings of clubs or organizations: Not on file    Relationship status: Not on file  Other Topics Concern  . Not on file  Social History Narrative  . Not on file    Vitals:   02/11/18 1615  BP: 124/82  Pulse: 76  Resp: 12  Temp: 98.1 F (36.7 C)  SpO2: 96%   Body mass index is 51.92 kg/m.    Physical Exam  Nursing note and vitals reviewed. Constitutional: She is oriented to person, place, and time. She appears well-developed. She does not appear ill. No distress.  HENT:  Head: Atraumatic.  Eyes: Pupils are equal, round, and reactive to light. Conjunctivae and EOM are normal.  Cardiovascular: Normal rate and regular rhythm.  No murmur heard. Pulses:      Dorsalis pedis pulses are 2+ on the right side, and 2+ on the left side.  Respiratory: Effort normal and breath sounds normal. No respiratory distress.  GI: Soft. She exhibits no mass. There is no hepatomegaly. There is no tenderness.  Musculoskeletal: She exhibits no edema.       Lumbar back: She exhibits decreased range of motion. She exhibits no bony tenderness.  Mild tenderness upon palpation of lumbar paraspinal muscles. Pain elicited with movement on exam table during examination. Hip pain is elicited upon flexion and rotation, no significant limitation in  ROM. Antalgic gait.  Lymphadenopathy:    She has no cervical adenopathy.  Neurological: She is alert and oriented to person, place, and time. She has normal strength.  She can walk in tiptoes and heels with some difficulty due to pain.  Skin: Skin is warm. No rash noted. No erythema.  Psychiatric: She has a normal mood and affect.  Well groomed, good eye contact.     ASSESSMENT AND PLAN:  Alexa West was seen today for left hip pain.  Diagnoses and all orders for this visit:  Left hip pain  We discussed possible causes. She has Hx  of OA,which could be causing problem. ? Radicular pain, tendinitis. Since she cannot take NSAID's,Tramadol was recommended.Side effects discussed. Topical Diclofenac may also help. F/U in 3 weeks if she can see ortho before.   Reviewing records,she has discussed problem with ortho, OV 11/06/17,Dr Nika.   -     traMADol (ULTRAM) 50 MG tablet; Take 1 tablet (50 mg total) by mouth every 8 (eight) hours as needed for up to 7 days. -     diclofenac sodium (VOLTAREN) 1 % GEL; Apply 4 g topically 4 (four) times daily.   Chronic low back pain, unspecified back pain laterality, with sciatica presence unspecified  Reporting improvement with epidural injection she received in 11/2017. Continue Methocarbamol 500 mg tid as needed.  Radicular pain of right lower back  After discussion of treatment options: TCA,Cymbalta,Lyrica,or Gabapentin; she would like to take Gabapentin.She took Gabapentin years ago and tolerated well. She will start with Gabapentin 100 mg at bedtime and titrate up to 300 mg  .  -     traMADol (ULTRAM) 50 MG tablet; Take 1 tablet (50 mg total) by mouth every 8 (eight) hours as needed for up to 7 days. -     gabapentin (NEURONTIN) 100 MG capsule; Take 3 capsules (300 mg total) by mouth 3 (three) times daily.    Return in about 3 weeks (around 03/04/2018), or if symptoms worsen or fail to improve.       Mikel Pyon G. Martinique, MD  [facility]. [place] office.

## 2018-02-13 ENCOUNTER — Ambulatory Visit
Admission: RE | Admit: 2018-02-13 | Discharge: 2018-02-13 | Disposition: A | Discharge status: Home / Self Care | Payer: No Typology Code available for payment source | Source: Ambulatory Visit | Attending: Family Medicine | Admitting: Family Medicine

## 2018-02-13 DIAGNOSIS — Z1239 Encounter for other screening for malignant neoplasm of breast: Secondary | ICD-10-CM

## 2018-02-20 MED FILL — GLYCOPYRROLATE 2 MG TABLET: 2 | 30 days supply | Qty: 30 | Fill #2

## 2018-02-26 ENCOUNTER — Encounter: Payer: Self-pay | Admitting: Family Medicine

## 2018-02-27 ENCOUNTER — Other Ambulatory Visit: Payer: Self-pay | Admitting: Family Medicine

## 2018-02-27 DIAGNOSIS — M545 Low back pain, unspecified: Secondary | ICD-10-CM

## 2018-03-02 MED FILL — GABAPENTIN 100 MG CAP: 100 | 10 days supply | Qty: 90 | Fill #1

## 2018-03-02 MED FILL — OMEPRAZOLE 40 MG CPDR: 40 | 30 days supply | Qty: 60 | Fill #2

## 2018-03-03 MED ORDER — METHOCARBAMOL 500 MG PO TABS: 500.0000 mg | ORAL_TABLET | Freq: Three times a day (TID) | ORAL | 1 refills | Status: DC | PRN

## 2018-03-03 MED FILL — METHOCARBAMOL 500 MG TABS: 500 | 15 days supply | Qty: 45 | Fill #0

## 2018-03-05 ENCOUNTER — Other Ambulatory Visit (HOSPITAL_COMMUNITY)
Admission: RE | Admit: 2018-03-05 | Discharge: 2018-03-05 | Disposition: A | Discharge status: Home / Self Care | Payer: No Typology Code available for payment source | Source: Ambulatory Visit | Attending: Obstetrics & Gynecology | Admitting: Obstetrics & Gynecology

## 2018-03-05 ENCOUNTER — Ambulatory Visit (INDEPENDENT_AMBULATORY_CARE_PROVIDER_SITE_OTHER): Payer: No Typology Code available for payment source | Admitting: Obstetrics & Gynecology

## 2018-03-05 ENCOUNTER — Other Ambulatory Visit: Payer: Self-pay

## 2018-03-05 ENCOUNTER — Encounter: Payer: Self-pay | Admitting: Obstetrics & Gynecology

## 2018-03-05 VITALS — BP 128/82 | HR 76 | Resp 18 | Ht 65.75 in | Wt 317.8 lb

## 2018-03-05 DIAGNOSIS — Z124 Encounter for screening for malignant neoplasm of cervix: Secondary | ICD-10-CM | POA: Insufficient documentation

## 2018-03-05 DIAGNOSIS — L680 Hirsutism: Secondary | ICD-10-CM

## 2018-03-05 DIAGNOSIS — K582 Mixed irritable bowel syndrome: Secondary | ICD-10-CM

## 2018-03-05 DIAGNOSIS — R7989 Other specified abnormal findings of blood chemistry: Secondary | ICD-10-CM

## 2018-03-05 DIAGNOSIS — N3281 Overactive bladder: Secondary | ICD-10-CM

## 2018-03-05 DIAGNOSIS — N393 Stress incontinence (female) (male): Secondary | ICD-10-CM

## 2018-03-05 DIAGNOSIS — Z01419 Encounter for gynecological examination (general) (routine) without abnormal findings: Secondary | ICD-10-CM | POA: Diagnosis not present

## 2018-03-05 MED ORDER — SERTRALINE HCL 100 MG PO TABS: 100.0000 mg | ORAL_TABLET | Freq: Every day | ORAL | 4 refills | Status: DC

## 2018-03-05 MED FILL — SERTRALINE HCL 100 MG TAB: 100 | 90 days supply | Qty: 90 | Fill #0

## 2018-03-05 NOTE — Progress Notes (Addendum)
47 y.o. G0P0000 MarriedCaucasianF here for new patient annual exam.  Used to live in [place] and kept her [employer] doctors until recently.  Having issues with reflux and food sensitivities.  Has constipation and diarrhea.  Has had endoscopies and biopsies.  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  Had see Dr. Harold Hedge for food testing.  Has some milk and wheat issues as well.  Cycles are regular, for the most part.  Occasionally has a cycle that starts a little lighter.  Usually flow is two to three and then stops and then will have a little spotting for another day or two.    Has urinary urgency.  Leaks with movements, lifting, cough.  Has a lot of urgency especially when she is going into her home.    Reports she was diagnosed with mild hypothyroidism and started on medication.  She started having hair loss and stopped the medication.  Not sure what to do at this point.  Lastly, having some depressed mood symptoms.  Has been on Zoloft in the past and this worked well for her.  Would like to restart if possible.  PCP:  Dr. Martinique.    Patient's last menstrual period was 03/04/2018.          Sexually active: No.  The current method of family planning is abstinence.    Exercising: No.  Walk at work  Smoker:  no  Health Maintenance: Pap:  07/2015 Normal   History of abnormal Pap:  Yes, LEEP 1996 MMG:  02/16/18 BIRADS1:Neg  Colonoscopy:  2000's  BMD:  Unsure  TDaP:  2019 Pneumonia vaccine(s):  n/a Shingrix:   n/a Hep C testing: 08/20/16 neg  Screening Labs: PCP   reports that she has never smoked. She has never used smokeless tobacco. She reports that she does not drink alcohol or use drugs.  Past Medical History:  Diagnosis Date  . 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 Anxiety   . Asthma   . Chicken pox   . Depression   . Genital warts   . GERD (gastroesophageal reflux disease)   . History of diverticulitis   . History of genital warts   . Hx: UTI (urinary tract  infection)   . Migraines   . Urine incontinence     Past Surgical History:  Procedure Laterality Date  . BLADDER SURGERY  1976  . CERVICAL BIOPSY  W/ LOOP ELECTRODE EXCISION  1996  . CHOLECYSTECTOMY  1997  . TONSILLECTOMY AND ADENOIDECTOMY  1996    Current Outpatient Medications  Medication Sig Dispense Refill  . acetaminophen (TYLENOL) 500 MG tablet Take 500 mg by mouth every 8 (eight) hours as needed.    Marland Kitchen albuterol (VENTOLIN HFA) 108 (90 Base) MCG/ACT inhaler as needed.    . diclofenac sodium (VOLTAREN) 1 % GEL Apply 4 g topically 4 (four) times daily. 4 Tube 3  . gabapentin (NEURONTIN) 100 MG capsule Take 3 capsules (300 mg total) by mouth 3 (three) times daily. 90 capsule 1  . glycopyrrolate (ROBINUL) 2 MG tablet Take 1 tab by mouth every morning. 30 tablet 6  . meloxicam (MOBIC) 15 MG tablet Take 15 mg by mouth daily.    . methocarbamol (ROBAXIN) 500 MG tablet Take 1 tablet (500 mg total) by mouth every 8 (eight) hours as needed for muscle spasms. 45 tablet 1  . naproxen (NAPROSYN) 500 MG tablet Take 500 mg by mouth daily as needed.    Marland Kitchen omeprazole (  PRILOSEC) 40 MG capsule Take 1 tab by mouth twice daily. 60 capsule 11  . rizatriptan (MAXALT-MLT) 10 MG disintegrating tablet Take 1 tablet (10 mg total) by mouth as needed for migraine. May repeat in 2 hours if needed 10 tablet 3  . sucralfate (CARAFATE) 1 g tablet Take 1 tablet (1 g total) by mouth 2 (two) times daily. 60 tablet 1  . traMADol (ULTRAM) 50 MG tablet Take 50 mg by mouth every 6 (six) hours as needed.    Marland Kitchen AUVI-Q 0.3 MG/0.3ML SOAJ injection     . levocetirizine (XYZAL) 5 MG tablet TAKE 1 TABLET BY MOUTH EVERY EVENING AS NEEDED, haven't started yet  5  . levothyroxine (SYNTHROID, LEVOTHROID) 25 MCG tablet Take 1 tablet (25 mcg total) by mouth daily before breakfast. (Patient not taking: Reported on 03/05/2018) 30 tablet 2   No current facility-administered medications for this visit.     Family History  Problem  Relation Age of Onset  . Arthritis Mother   . Depression Mother   . Hyperlipidemia Mother   . Miscarriages / [place] Mother   . Skin cancer Mother   . Early death Father   . Alcohol abuse Maternal Grandmother   . Arthritis Maternal Grandmother   . Depression Maternal Grandmother   . Diabetes Maternal Grandmother   . Hyperlipidemia Maternal Grandmother   . Hypertension Maternal Grandmother   . Kidney disease Maternal Grandmother   . Arthritis Maternal Grandfather   . Hypertension Maternal Grandfather   . Kidney disease Maternal Grandfather   . Heart attack Maternal Grandfather   . Leukemia Maternal Grandfather   . Bone cancer Maternal Grandfather   . Alcohol abuse Paternal Grandmother   . Cancer Paternal Grandmother   . Alcohol abuse Paternal Grandfather     Review of Systems  All other systems reviewed and are negative.   Exam:   BP 128/82 (BP Location: Right Arm, Patient Position: Sitting, Cuff Size: Large)   Pulse 76   Resp 18   Ht 5' 5.75" (1.67 m)   Wt (!) 317 lb 12.8 oz (144.2 kg)   LMP 03/04/2018   BMI 51.69 kg/m     Height: 5' 5.75" (167 cm)  Ht Readings from Last 3 Encounters:  03/05/18 5' 5.75" (1.67 m)  02/11/18 5' 5.75" (1.67 m)  01/02/18 5' 5.75" (1.67 m)    General appearance: alert, cooperative and appears stated age Head: Normocephalic, without obvious abnormality, atraumatic Neck: no adenopathy, supple, symmetrical, trachea midline and thyroid normal to inspection and palpation Lungs: clear to auscultation bilaterally Breasts: normal appearance, no masses or tenderness Heart: regular rate and rhythm Abdomen: soft, non-tender; bowel sounds normal; no masses,  no organomegaly Extremities: extremities normal, atraumatic, no cyanosis or edema Skin: Skin color, texture, turgor normal. No rashes or lesions Lymph nodes: Cervical, supraclavicular, and axillary nodes normal. No abnormal inguinal nodes palpated Neurologic: Grossly normal   Pelvic:  External genitalia:  no lesions              Urethra:  normal appearing urethra with no masses, tenderness or lesions              Bartholins and Skenes: normal                 Vagina: normal appearing vagina with normal color and discharge, no lesions              Cervix: no lesions  Pap taken: Yes.   Bimanual Exam:  Uterus:  normal size, contour, position, consistency, mobility, non-tender              Adnexa: normal adnexa and no mass, fullness, tenderness               Rectovaginal: Confirms               Anus:  normal sphincter tone, no lesions  Patient declines chaperone for exams.    A:  Well Woman with normal exam OAB/SUI Depression Hirsutism H/o PCOS Possible hypothyroidism but had hair loss with thyroid medication so just recently stopped the medication  P:   Mammogram guidelines reviewed. pap smear with HR HPV obtained today.  Desires yearly pap smears.  Aware of guidelines. Zoloft 100mg  daily.  #90/4RF.  Recheck  2 months. Plan TSH when returns in two months.  Stopped medication due to hair loss.  Testosterone level obtinaed today GI referral made Urology referral placed as well today.  Feel she needs urodynamics and then possibly medication and/or surgery Return annually or prn

## 2018-03-06 ENCOUNTER — Other Ambulatory Visit: Payer: Self-pay | Admitting: Family Medicine

## 2018-03-06 DIAGNOSIS — M545 Low back pain: Principal | ICD-10-CM

## 2018-03-06 DIAGNOSIS — G8929 Other chronic pain: Secondary | ICD-10-CM

## 2018-03-07 LAB — TESTT+TESTF+SHBG
Sex Hormone Binding: 104.9 nmol/L (ref 24.6–122.0)
Testosterone, Free: 6.4 pg/mL — ABNORMAL HIGH (ref 0.0–4.2)
Testosterone, total: 32.6 ng/dL

## 2018-03-09 LAB — CYTOLOGY - PAP
ADEQUACY: ABSENT
Diagnosis: NEGATIVE
HPV: NOT DETECTED

## 2018-03-10 ENCOUNTER — Telehealth: Payer: Self-pay | Admitting: Obstetrics & Gynecology

## 2018-03-10 NOTE — Telephone Encounter (Signed)
Patient was seen 03/05/18 and her prescription was not at her pharmacy.

## 2018-03-10 NOTE — Telephone Encounter (Signed)
Spoke with patient. Patient states she discussed Rx for aldactone for PCOS and increased facial hair at [facility] on 03/05/18. Rx was not at pharmacy, calling to follow up.   Advised RX for Aldactone not sent, will review with Dr. Sabra Heck and return call with recommendations.   Dr. Sabra Heck -please review labs dated 03/05/18 and advise on Rx.

## 2018-03-11 NOTE — Telephone Encounter (Signed)
I routed you her result note.  I did not have her testosterone results and that is why I hadn't sent in the rx.  We discussed me having a baseline first before starting this.    Also, I would like to get her records from previous ob gyn.  Will you ask her to get these for me?  She should have a two month follow up already scheduled.  I definitely want her to keep this appt.  Thanks.

## 2018-03-11 NOTE — Telephone Encounter (Signed)
Left message to call Ranald Alessio at [phone].  

## 2018-03-11 NOTE — Telephone Encounter (Signed)
-----   Message from Megan Salon, MD sent at 03/11/2018  4:59 AM EDT ----- Please let pt know her free testosterone was elevated, not her total, however.  Also, her pap smear was normal and HR HPV was negative.  Ok to start spironolactone 50mg  bid.  #60/1RF.  She should have follow up scheduled in 2 months.  We will assess this in two months and decide if should increase dosage.  She is not having vaginal intercourse but if this changes, needs contraception with the spironolactone as well.  Thanks.  02 recall.  CC:  Lowell Bouton, CMA

## 2018-03-13 MED ORDER — SPIRONOLACTONE 50 MG PO TABS: 50.0000 mg | ORAL_TABLET | Freq: Two times a day (BID) | ORAL | 1 refills | Status: DC

## 2018-03-13 MED FILL — SPIRONOLACTONE 50 MG TAB: 50 | 30 days supply | Qty: 60 | Fill #0

## 2018-03-13 NOTE — Telephone Encounter (Signed)
Spoke with patient and message from Dr. Sabra Heck given.  She has a follow up in September scheduled.  She is aware of need for contraception if necessary. Will call back with any questions.  Encounter closed.

## 2018-03-25 ENCOUNTER — Telehealth: Payer: Self-pay | Admitting: Obstetrics & Gynecology

## 2018-03-25 NOTE — Telephone Encounter (Signed)
Call placed to patient to convey appointment information for referral to Dr Matilde Sprang. Left voicemail message requesting a return call. Upon return call please convey the following information:  Appointment scheduled for 04/23/18 at 10:15 with Dr Matilde Sprang. Patient will need to arrive 30 minutes early for appointment. Patient will receive a new patient packet in the mail from [facility] with additional information  Should patient need to re-schedule appointment, she can contact their office at [phone]   cc: Dr Sabra Heck

## 2018-03-31 ENCOUNTER — Encounter: Payer: Self-pay | Admitting: Obstetrics & Gynecology

## 2018-03-31 ENCOUNTER — Telehealth: Payer: Self-pay | Admitting: Obstetrics & Gynecology

## 2018-03-31 NOTE — Addendum Note (Signed)
Addended by: Wyline Beady on: 03/31/2018 01:31 PM   Modules accepted: Orders

## 2018-03-31 NOTE — Telephone Encounter (Signed)
Patient sent the following message through [place]. Routing to [place] to assist patient with request.   Dr. Collene Mares receives the referral for my office visit today. However, they didnt have an authorization for my insurance. I place one thru the app but can someone call and get that for me?   Thanks

## 2018-04-01 ENCOUNTER — Telehealth: Payer: Self-pay | Admitting: Obstetrics & Gynecology

## 2018-04-01 MED FILL — RIZATRIPTAN 10 MG ODT: 10 | 30 days supply | Qty: 10 | Fill #0

## 2018-04-01 MED FILL — GLYCOPYRROLATE 2 MG TABS: 2 | 30 days supply | Qty: 30 | Fill #3

## 2018-04-01 MED FILL — METHOCARBAMOL 500 MG TABS: 500 | 15 days supply | Qty: 45 | Fill #1

## 2018-04-01 NOTE — Telephone Encounter (Signed)
Lattie Haw from [employer] is returning Rosa's phone call.

## 2018-04-24 ENCOUNTER — Ambulatory Visit (INDEPENDENT_AMBULATORY_CARE_PROVIDER_SITE_OTHER): Payer: No Typology Code available for payment source | Admitting: Specialist

## 2018-04-29 MED FILL — SPIRONOLACTONE 50 MG TABLET: 50 | 30 days supply | Qty: 60 | Fill #1

## 2018-04-30 MED FILL — DEXILANT DR 60 MG CAPSULE: 60 | 30 days supply | Qty: 30 | Fill #0

## 2018-05-05 ENCOUNTER — Encounter: Payer: Self-pay | Admitting: Obstetrics & Gynecology

## 2018-05-05 ENCOUNTER — Ambulatory Visit (INDEPENDENT_AMBULATORY_CARE_PROVIDER_SITE_OTHER): Payer: No Typology Code available for payment source | Admitting: Obstetrics & Gynecology

## 2018-05-05 VITALS — BP 128/80 | HR 68 | Resp 18 | Ht 65.75 in | Wt 309.2 lb

## 2018-05-05 DIAGNOSIS — R7989 Other specified abnormal findings of blood chemistry: Secondary | ICD-10-CM

## 2018-05-05 DIAGNOSIS — R21 Rash and other nonspecific skin eruption: Secondary | ICD-10-CM | POA: Diagnosis not present

## 2018-05-05 NOTE — Progress Notes (Addendum)
GYNECOLOGY  VISIT  CC:   Follow-up   HPI: 47 y.o. G0P0000 Married White or Caucasian female here for follow up of several issues.  Seen as new patient in August.  She has several issues that we addressed at that visit.  1)  IBS hx.  Desired second opinion.  Did see Dr. Collene Mares and was started on Carafate and Dexilant.  Admits she is not taking the dexilant as prescribed.  Has follow-up that she felt was disappointing.  Would consider another referral.  Option for [facility] discussed.  2)  Urinary urgency and incontinence.  Has appt with Dr. Matilde Sprang on 05/26/18.  Did have an earlier appointment but needed to reschedule this.    3)  Hirsutism with hx of PCOs.  On spirinolactone.  Facial hair has improved.  Would like to stay on this.  4)  Hair loss that she thought was due to starting thyroid medication.  Feels that her hair loss has decreased.  She still thinks she still losing hair.  She states her husband thinks she looses a lot more hair than in the past.  Hasn't had a hair cut since April.    5)  Depression symptoms.  Restarted Zoloft 100mg  daily.  Feels tearfulness has improved.  Has been on this about two months.  Feels this is the correct dosing for her at this time and does not want to increase this at this time  6)  A new issue--hip/groin pain.  About a month ago, she reports having a really severe cramp in the back of her left thigh.  This radiated to her left inguinal region.  Now that pain is present all of the time.  At night, needs to put a pillow under her left knee to help alleviate the pain.  When she stands up, it takes her a few minutes to straighten up fully.  When she puts more of her weight on her left side, she has much more pain.  Does have a little limp when she walks.  Has the most when she goes to stand up first thing in the morning or after she's been sitting for an extended period of time.  Pain never really goes away.  Has taken tart cherry extract to see if this will help.   It has not.  7) Another new issue--pt has pictures on her phone of vasculitis appearing rash (erythematous, macular) that occurs after standing extended periods of time at work.  It does not happen every day and she cannot figure out why it occurs certain days.  Heat does not seem to play any role in this.  It does not hurt or itch.  She does not have it today.  I wonder if this is a stasis related vasculitis.  Resolves with elevating feet (she thinks) but is gone in the morning of the next day when it occurs.    GYNECOLOGIC HISTORY: Patient's last menstrual period was 04/21/2018 (exact date).  Patient Active Problem List   Diagnosis Date Noted  . PCOS (polycystic ovarian syndrome) 12/05/2017  . History of IBS 12/05/2017  . S/P cholecystectomy 12/05/2017  . Gastric ulcer with hemorrhage 12/05/2017  . Eosinophilic esophagitis 93/81/8299  . Morbid obesity with body mass index (BMI) of 40.0 to 49.9 ([place]) 10/13/2017  . Migraine headache without aura 10/13/2017  . GI bleed 08/20/2016  . Transaminitis 08/20/2016  . Prediabetes 08/20/2016  . Low back pain 05/24/2014  . Impaired fasting glucose 09/18/2010  . Depression 03/04/2008  .  Esophageal reflux 03/04/2008    Past Medical History:  Diagnosis Date  . 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 Anxiety   . Asthma   . Chicken pox   . Depression   . Genital warts   . GERD (gastroesophageal reflux disease)   . History of diverticulitis   . History of genital warts   . Hx: UTI (urinary tract infection)   . Migraines   . Urine incontinence     Past Surgical History:  Procedure Laterality Date  . CERVICAL BIOPSY  W/ LOOP ELECTRODE EXCISION  1996  . CHOLECYSTECTOMY  1997  . HERNIA REPAIR  1976  . TONSILLECTOMY AND ADENOIDECTOMY  1996  . urethral stretching  1980    MEDS:   Current Outpatient Medications on File Prior to Visit  Medication Sig Dispense Refill  . acetaminophen (TYLENOL) 500 MG tablet Take 500 mg by mouth every  8 (eight) hours as needed.    Marland Kitchen albuterol (VENTOLIN HFA) 108 (90 Base) MCG/ACT inhaler as needed.    Marland Kitchen AUVI-Q 0.3 MG/0.3ML SOAJ injection     . DEXILANT 60 MG capsule daily as needed.  12  . diclofenac sodium (VOLTAREN) 1 % GEL Apply 4 g topically 4 (four) times daily. 4 Tube 3  . glycopyrrolate (ROBINUL) 2 MG tablet Take 1 tab by mouth every morning. 30 tablet 6  . levocetirizine (XYZAL) 5 MG tablet TAKE 1 TABLET BY MOUTH EVERY EVENING AS NEEDED, haven't started yet  5  . methocarbamol (ROBAXIN) 500 MG tablet Take 1 tablet (500 mg total) by mouth every 8 (eight) hours as needed for muscle spasms. 45 tablet 1  . naproxen (NAPROSYN) 500 MG tablet Take 500 mg by mouth daily as needed.    . rizatriptan (MAXALT-MLT) 10 MG disintegrating tablet Take 1 tablet (10 mg total) by mouth as needed for migraine. May repeat in 2 hours if needed 10 tablet 3  . sertraline (ZOLOFT) 100 MG tablet Take 1 tablet (100 mg total) by mouth daily. 90 tablet 4  . spironolactone (ALDACTONE) 50 MG tablet Take 1 tablet (50 mg total) by mouth 2 (two) times daily. 60 tablet 1  . sucralfate (CARAFATE) 1 g tablet Take 1 tablet (1 g total) by mouth 2 (two) times daily. 60 tablet 1  . traMADol (ULTRAM) 50 MG tablet Take 50 mg by mouth every 6 (six) hours as needed.     No current facility-administered medications on file prior to visit.     ALLERGIES: Cashew nut oil; Milk-related compounds; Wheat bran; Cephalosporins; and Penicillins  Family History  Problem Relation Age of Onset  . Arthritis Mother   . Depression Mother   . Hyperlipidemia Mother   . Miscarriages / [place] Mother   . Skin cancer Mother   . Early death Father   . Alcohol abuse Maternal Grandmother   . Arthritis Maternal Grandmother   . Depression Maternal Grandmother   . Diabetes Maternal Grandmother   . Hyperlipidemia Maternal Grandmother   . Hypertension Maternal Grandmother   . Kidney disease Maternal Grandmother   . Arthritis Maternal  Grandfather   . Hypertension Maternal Grandfather   . Kidney disease Maternal Grandfather   . Heart attack Maternal Grandfather   . Leukemia Maternal Grandfather   . Bone cancer Maternal Grandfather   . Alcohol abuse Paternal Grandmother   . Cancer Paternal Grandmother   . Alcohol abuse Paternal Grandfather     SH:  Married, non smoker  Review  of Systems  Constitutional: Negative.   Respiratory: Negative.   Cardiovascular: Negative.   Gastrointestinal:       Alternating diarrhea and constipation  Genitourinary: Negative.   Skin: Positive for rash (that is not present today).  Neurological: Negative.   Psychiatric/Behavioral: Negative.     PHYSICAL EXAMINATION:    BP 128/80 (BP Location: Right Arm, Patient Position: Sitting, Cuff Size: Large)   Pulse 68   Resp 18   Ht 5' 5.75" (1.67 m)   Wt (!) 309 lb 3.2 oz (140.3 kg)   LMP 04/21/2018 (Exact Date)   BMI 50.29 kg/m     General appearance: alert, cooperative and appears stated age No additional exam performed today LE:  No rashes, edema noted today  Assessment: IBS PCOS with hirsutism Possible hypothyroidism with hair loss due to thryoid medication Left hip, inguinal region pain Depression, improved Possible stasis related LE rash vs vasculitis  Plan: Will continue on 100mg  Zoloft.  Pt has rx. Continue with Spironolactone 50mg  bid.  She will let me know if wants ot increase this. Has seen ortho in the past and is strongly advised to call for appt TSH obtained today Referral to [facility] for IBS will be made Referral to dermatology will be made.   ~45 minutes spent with patient with all of this time spent in face to face discussion with pt.

## 2018-05-06 LAB — TSH: TSH: 2.84 u[IU]/mL (ref 0.450–4.500)

## 2018-05-09 ENCOUNTER — Encounter (HOSPITAL_COMMUNITY): Payer: Self-pay | Admitting: Emergency Medicine

## 2018-05-09 ENCOUNTER — Emergency Department (HOSPITAL_COMMUNITY): Payer: No Typology Code available for payment source

## 2018-05-09 ENCOUNTER — Emergency Department (HOSPITAL_COMMUNITY)
Admission: EM | Admit: 2018-05-09 | Discharge: 2018-05-09 | Disposition: A | Discharge status: Home / Self Care | Payer: No Typology Code available for payment source | Attending: Emergency Medicine | Admitting: Emergency Medicine

## 2018-05-09 ENCOUNTER — Other Ambulatory Visit: Payer: Self-pay

## 2018-05-09 DIAGNOSIS — Z79899 Other long term (current) drug therapy: Secondary | ICD-10-CM | POA: Insufficient documentation

## 2018-05-09 DIAGNOSIS — J45909 Unspecified asthma, uncomplicated: Secondary | ICD-10-CM | POA: Diagnosis not present

## 2018-05-09 DIAGNOSIS — R7303 Prediabetes: Secondary | ICD-10-CM | POA: Diagnosis not present

## 2018-05-09 DIAGNOSIS — M545 Low back pain, unspecified: Secondary | ICD-10-CM

## 2018-05-09 DIAGNOSIS — R1031 Right lower quadrant pain: Secondary | ICD-10-CM | POA: Insufficient documentation

## 2018-05-09 LAB — CBC WITH DIFFERENTIAL/PLATELET
ABS IMMATURE GRANULOCYTES: 0.03 10*3/uL (ref 0.00–0.07)
Basophils Absolute: 0.1 10*3/uL (ref 0.0–0.1)
Basophils Relative: 1 %
EOS PCT: 14 %
Eosinophils Absolute: 1.4 10*3/uL — ABNORMAL HIGH (ref 0.0–0.5)
HEMATOCRIT: 43.4 % (ref 36.0–46.0)
HEMOGLOBIN: 13.4 g/dL (ref 12.0–15.0)
Immature Granulocytes: 0 %
LYMPHS ABS: 3.2 10*3/uL (ref 0.7–4.0)
LYMPHS PCT: 32 %
MCH: 27.1 pg (ref 26.0–34.0)
MCHC: 30.9 g/dL (ref 30.0–36.0)
MCV: 87.7 fL (ref 80.0–100.0)
MONO ABS: 0.7 10*3/uL (ref 0.1–1.0)
Monocytes Relative: 7 %
NEUTROS ABS: 4.7 10*3/uL (ref 1.7–7.7)
NRBC: 0 % (ref 0.0–0.2)
Neutrophils Relative %: 46 %
Platelets: 294 10*3/uL (ref 150–400)
RBC: 4.95 MIL/uL (ref 3.87–5.11)
RDW: 13.1 % (ref 11.5–15.5)
WBC: 10.1 10*3/uL (ref 4.0–10.5)

## 2018-05-09 LAB — URINALYSIS, ROUTINE W REFLEX MICROSCOPIC
BILIRUBIN URINE: NEGATIVE
GLUCOSE, UA: NEGATIVE mg/dL
HGB URINE DIPSTICK: NEGATIVE
Ketones, ur: NEGATIVE mg/dL
Leukocytes, UA: NEGATIVE
Nitrite: NEGATIVE
Protein, ur: NEGATIVE mg/dL
SPECIFIC GRAVITY, URINE: 1.005 (ref 1.005–1.030)
pH: 6 (ref 5.0–8.0)

## 2018-05-09 LAB — I-STAT BETA HCG BLOOD, ED (MC, WL, AP ONLY): I-stat hCG, quantitative: <5 m[IU]/mL (ref <5)

## 2018-05-09 LAB — COMPREHENSIVE METABOLIC PANEL
ALK PHOS: 82 U/L (ref 38–126)
ALT: 30 U/L (ref 0–44)
ANION GAP: 8 (ref 5–15)
AST: 40 U/L (ref 15–41)
Albumin: 3.8 g/dL (ref 3.5–5.0)
BILIRUBIN TOTAL: 0.6 mg/dL (ref 0.3–1.2)
BUN: 9 mg/dL (ref 6–20)
CALCIUM: 9.3 mg/dL (ref 8.9–10.3)
CO2: 23 mmol/L (ref 22–32)
CREATININE: 0.88 mg/dL (ref 0.44–1.00)
Chloride: 104 mmol/L (ref 98–111)
GFR calc non Af Amer: >60 mL/min (ref >60)
GLUCOSE: 89 mg/dL (ref 70–99)
Potassium: 4.1 mmol/L (ref 3.5–5.1)
Sodium: 135 mmol/L (ref 135–145)
TOTAL PROTEIN: 7.7 g/dL (ref 6.5–8.1)

## 2018-05-09 MED ORDER — MORPHINE SULFATE (PF) 4 MG/ML IV SOLN
4.0000 mg | Freq: Once | INTRAVENOUS | Status: AC
  Administered 2018-05-09: 4 mg via INTRAVENOUS
  Filled 2018-05-09: qty 1

## 2018-05-09 MED ORDER — CYCLOBENZAPRINE HCL 10 MG PO TABS: 10.0000 mg | ORAL_TABLET | Freq: Two times a day (BID) | ORAL | 0 refills | Status: DC | PRN

## 2018-05-09 MED ORDER — IOHEXOL 300 MG/ML  SOLN
100.0000 mL | Freq: Once | INTRAMUSCULAR | Status: AC | PRN
  Administered 2018-05-09: 100 mL via INTRAVENOUS

## 2018-05-09 MED ORDER — DICLOFENAC SODIUM 1 % TD GEL: 2.0000 g | Freq: Four times a day (QID) | TRANSDERMAL | 0 refills | Status: DC

## 2018-05-09 MED ORDER — DEXAMETHASONE SODIUM PHOSPHATE 10 MG/ML IJ SOLN
10.0000 mg | Freq: Once | INTRAMUSCULAR | Status: AC
  Administered 2018-05-09: 10 mg via INTRAVENOUS
  Filled 2018-05-09: qty 1

## 2018-05-09 MED ORDER — LIDOCAINE 5 % EX PTCH: 1.0000 | MEDICATED_PATCH | CUTANEOUS | 0 refills | Status: DC

## 2018-05-09 MED ORDER — CYCLOBENZAPRINE HCL 10 MG PO TABS
5.0000 mg | ORAL_TABLET | Freq: Once | ORAL | Status: AC
  Administered 2018-05-09: 5 mg via ORAL
  Filled 2018-05-09: qty 1

## 2018-05-09 MED ORDER — PREDNISONE 10 MG PO TABS: 40.0000 mg | ORAL_TABLET | Freq: Every day | ORAL | 0 refills | Status: AC

## 2018-05-09 MED ORDER — METHYLPREDNISOLONE 4 MG PO TBPK: ORAL_TABLET | ORAL | 0 refills | Status: DC

## 2018-05-09 NOTE — Discharge Instructions (Addendum)
Stop taking Robaxin and start taking Flexeril.  If it is too strong such as side effects are too much, take a half a tablet.  Do not drive, drink alcohol, operate heavy machinery, or make important decisions while taking this medicine as it may make you drowsy. Take prednisone as prescribed.  Do not take ibuprofen, Advil, Aleve, or Motrin taking this medicine.  Take it with food and take it with Protonix daily. Use Voltaren gel to help with pain and swelling.  Use lidocaine patches as needed for pain.  Follow-up with your orthopedic doctor at your scheduled appointment for further evaluation of your symptoms. Return to the emergency room if you develop fevers, loss of bowel bladder control, numbness/tingling, or any new or concerning symptoms.

## 2018-05-09 NOTE — ED Provider Notes (Signed)
Received patient at signout from [place].  Refer to provider note for full history and physical examination.  Briefly, patient is a 47 year old female with history of chronic back pain presenting for evaluation of back pain.  She was also found to have right upper quadrant tenderness on examination of the abdomen.  Awaiting lab work and CT abdomen and pelvis to rule out appendicitis.  If CT shows no acute surgical abdominal pathology, likely stable for discharge home with management of her back pain.  MDM   CT scan shows no evidence of acute abdominopelvic pathology.  She does have significant degenerative changes of the thoracolumbar spine advanced for age.  No acute appearing osseous abnormality.  Pain improved with morphine in the ED.  Stable for discharge home with management of what is likely musculoskeletal back pain.  She does have an orthopedist that she has been seeing and has follow-up scheduled in the near future.  Advised to take Protonix while taking her prednisone or any anti-inflammatories.  Discussed strict ED return precautions. Pt verbalized understanding of and agreement with plan and is safe for discharge home at this time.       Renita Papa, PA-C 05/09/18 2314    Hayden Rasmussen, MD 05/10/18 [phone]

## 2018-05-09 NOTE — ED Provider Notes (Addendum)
[facility] EMERGENCY DEPARTMENT Provider Note   CSN: [id number] Arrival date & time: 05/09/18  1401     History   Chief Complaint Chief Complaint  Patient presents with  . Back Pain    HPI Alexa West is a 47 y.o. female resenting for evaluation of back pain.  Patient states she is a history of chronic back pain.  For the past 2 weeks, she has been having gradually increasing pain on her right side.  Her chronic pain is normally on the left side.  She has been taking Robaxin without improvement of symptoms.  Additionally, she has been taking Tylenol, is not supposed to take NSAIDs due to gastric ulcer.  Today, pain worsens severely, and while she was at work, she was having severe back pain which prevented her from being able to work.  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  Patient has an appointment with her orthopedic doctor in 4 days for evaluation of her chronic left back pain.  It is constant, radiates from her right back to her right abdomen and groin.  Nothing makes it better.  She denies fevers, chills, chest pain, shortness breath, nausea, vomiting, urinary symptoms, abnormal bowel movements.  Urination does not make the pain worse.  Eating does not make the pain worse.  Patient has a history of cholecystectomy, still has her appendix.  She does have a history of kidney stones, but states this feels different from previous episodes.  HPI  Past Medical History:  Diagnosis Date  . 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 Anxiety   . Asthma   . Chicken pox   . Depression   . Genital warts   . GERD (gastroesophageal reflux disease)   . History of diverticulitis   . History of genital warts   . Hx: UTI (urinary tract infection)   . Migraines   . Urine incontinence     Patient Active Problem List   Diagnosis Date Noted  . PCOS (polycystic ovarian syndrome) 12/05/2017  . History of IBS 12/05/2017  . S/P cholecystectomy 12/05/2017    . Gastric ulcer with hemorrhage 12/05/2017  . Eosinophilic esophagitis 67/20/9470  . Morbid obesity with body mass index (BMI) of 40.0 to 49.9 ([place]) 10/13/2017  . Migraine headache without aura 10/13/2017  . GI bleed 08/20/2016  . Transaminitis 08/20/2016  . Prediabetes 08/20/2016  . Low back pain 05/24/2014  . Impaired fasting glucose 09/18/2010  . Depression 03/04/2008  . Esophageal reflux 03/04/2008    Past Surgical History:  Procedure Laterality Date  . CERVICAL BIOPSY  W/ LOOP ELECTRODE EXCISION  1996  . CHOLECYSTECTOMY  1997  . HERNIA REPAIR  1976  . TONSILLECTOMY AND ADENOIDECTOMY  1996  . urethral stretching  1980     OB History    Gravida  0   Para  0   Term  0   Preterm  0   AB  0   Living  0     SAB  0   TAB  0   Ectopic  0   Multiple  0   Live Births  0            Home Medications    Prior to Admission medications   Medication Sig Start Date End Date Taking? Authorizing Provider  acetaminophen (TYLENOL) 500 MG tablet Take 500 mg by mouth every 8 (eight) hours as needed.    [provider]  albuterol (VENTOLIN HFA) 108 (90 Base) MCG/ACT inhaler as needed. 06/02/17   [provider]  AUVI-Q 0.3 MG/0.3ML SOAJ injection  01/27/18   [provider]  DEXILANT 60 MG capsule daily as needed. 04/30/18   [provider]  diclofenac sodium (VOLTAREN) 1 % GEL Apply 4 g topically 4 (four) times daily. 02/11/18   Martinique, Betty G, MD  glycopyrrolate (ROBINUL) 2 MG tablet Take 1 tab by mouth every morning. 12/05/17   Esterwood, Amy S, PA-C  levocetirizine (XYZAL) 5 MG tablet TAKE 1 TABLET BY MOUTH EVERY EVENING AS NEEDED, haven't started yet 01/15/18   [provider]  methocarbamol (ROBAXIN) 500 MG tablet Take 1 tablet (500 mg total) by mouth every 8 (eight) hours as needed for muscle spasms. 03/03/18   Martinique, Betty G, MD  naproxen (NAPROSYN) 500 MG tablet Take 500 mg by mouth daily as needed.    [provider]  rizatriptan (MAXALT-MLT) 10 MG disintegrating tablet Take 1 tablet (10 mg total) by mouth as needed for migraine. May repeat in 2 hours if needed 11/05/17   Martinique, Betty G, MD  sertraline (ZOLOFT) 100 MG tablet Take 1 tablet (100 mg total) by mouth daily. 03/05/18   Megan Salon, MD  spironolactone (ALDACTONE) 50 MG tablet Take 1 tablet (50 mg total) by mouth 2 (two) times daily. 03/13/18 05/12/18  Megan Salon, MD  sucralfate (CARAFATE) 1 g tablet Take 1 tablet (1 g total) by mouth 2 (two) times daily. 12/12/17   Armbruster, Carlota Raspberry, MD  traMADol (ULTRAM) 50 MG tablet Take 50 mg by mouth every 6 (six) hours as needed.    [provider]    Family History Family History  Problem Relation Age of Onset  . Arthritis Mother   . Depression Mother   . Hyperlipidemia Mother   . Miscarriages / [place] Mother   . Skin cancer Mother   . Early death Father   . Alcohol abuse Maternal Grandmother   . Arthritis Maternal Grandmother   . Depression Maternal Grandmother   . Diabetes Maternal Grandmother   . Hyperlipidemia Maternal Grandmother   . Hypertension Maternal Grandmother   . Kidney disease Maternal Grandmother   . Arthritis Maternal Grandfather   . Hypertension Maternal Grandfather   . Kidney disease Maternal Grandfather   . Heart attack Maternal Grandfather   . Leukemia Maternal Grandfather   . Bone cancer Maternal Grandfather   . Alcohol abuse Paternal Grandmother   . Cancer Paternal Grandmother   . Alcohol abuse Paternal Grandfather     Social History Social History   Tobacco Use  . Smoking status: Never Smoker  . Smokeless tobacco: Never Used  Substance Use Topics  . Alcohol use: No    Frequency: Never  . Drug use: No     Allergies   Cashew nut oil; Milk-related compounds; Wheat bran; Cephalosporins; and Penicillins   Review of Systems Review of Systems  Gastrointestinal: Positive for abdominal pain.  Musculoskeletal: Positive for back pain.  All  other systems reviewed and are negative.    Physical Exam Updated Vital Signs BP 126/66   Pulse 63   Temp 98.5 F (36.9 C) (Oral)   Resp (!) 22   Ht 5' 5.75" (1.67 m)   Wt (!) 140.2 kg   LMP 04/21/2018 (Exact Date)   SpO2 95%   BMI 50.27 kg/m   Physical Exam  Constitutional: She is oriented to person, place, and time. She appears well-developed and well-nourished.  Patient appears very uncomfortable due to pain  HENT:  Head: Normocephalic and atraumatic.  Eyes: Pupils are equal, round, and reactive to light. Conjunctivae and EOM are normal.  Neck: Normal range of motion. Neck supple.  Cardiovascular: Normal rate, regular rhythm and intact distal pulses.  Pulmonary/Chest: Effort normal and breath sounds normal. No respiratory distress. She has no wheezes.  Abdominal: Soft. She exhibits no distension and no mass. There is tenderness. There is no rebound and no guarding.  Tenderness palpation of right lower quadrant voluntary guarding.  No rigidity or distention.  No tenderness palpation elsewhere in the abdomen.  Musculoskeletal: Normal range of motion. She exhibits tenderness.  Tenderness palpation of right flank and low back.  No tenderness palpation along the sciatic nerve.  Patient unable to walk due to pain.  Neurological: She is alert and oriented to person, place, and time. No sensory deficit.  Skin: Skin is warm and dry. Capillary refill takes less than 2 seconds.  Psychiatric: She has a normal mood and affect.  Nursing note and vitals reviewed.    ED Treatments / Results  Labs (all labs ordered are listed, but only abnormal results are displayed) Labs Reviewed  CBC WITH DIFFERENTIAL/PLATELET - Abnormal; Notable for the following components:      Result Value   Eosinophils Absolute 1.4 (*)    All other components within normal limits  COMPREHENSIVE METABOLIC PANEL  URINALYSIS, ROUTINE W REFLEX MICROSCOPIC  I-STAT BETA HCG BLOOD, ED ([facility], [facility], [facility] ONLY)     EKG None  Radiology No results found.  Procedures Procedures (including critical care time)  Medications Ordered in ED Medications  morphine 4 MG/ML injection 4 mg (4 mg Intravenous Given 05/09/18 1523)  cyclobenzaprine (FLEXERIL) tablet 5 mg (5 mg Oral Given 05/09/18 1704)     Initial Impression / Assessment and Plan / ED Course  I have reviewed the triage vital signs and the nursing notes.  Pertinent labs & imaging results that were available during my care of the patient were reviewed by me and considered in my medical decision making (see chart for details).     Patient presenting for evaluation of severe increase in back pain.  Physical exam shows patient who is very uncomfortable due to pain.  She is afebrile not tachycardic.  Symptoms have been present for several weeks, but worsened significantly today.  On exam, patient with tenderness palpation of the anterior abdomen including right lower quadrant.  Patient states she can feel it in her back when she is pressing, however on exam she has guarding with palpation.  I am concerned about possible intra-abdominal pathology such as appendicitis, pyelo, or kidney stone.  Obtain labs, urine, and CT abdomen.  Morphine given for pain control.  On reevaluation, patient reports pain is mildly improved with morphine, although she still has spasm knee pain and increased pain with movement.  Labs reassuring, no leukocytosis.  Creatinine stable.  Urine pending.  CT pending.  Will give dose of Flexeril for possible muscle pain while waiting further tests.  Pt signed out to Amada Kingfisher, PA-C for f/u on labs. Plan to d/c with [facility] pain control (switch robaxin to flexeril, prednisone, voltaren, lidocaine patch) and f/u with ortho if labs/UA/imaging are normal.   Final Clinical Impressions(s) / ED Diagnoses   Final diagnoses:  None    ED Discharge Orders    None       Franchot Heidelberg, PA-C 05/09/18 [place], Chastity Noland,  PA-C 05/09/18 1738  Daleen Bo, MD 05/10/18 [phone]

## 2018-05-09 NOTE — ED Triage Notes (Signed)
Pt. Stated, crying in triage, my back is hurting so bad it hurts to breath, this episode started 2 weeks ago. It radiates to the pelvic area.

## 2018-05-13 ENCOUNTER — Ambulatory Visit (INDEPENDENT_AMBULATORY_CARE_PROVIDER_SITE_OTHER): Payer: No Typology Code available for payment source

## 2018-05-13 ENCOUNTER — Encounter (INDEPENDENT_AMBULATORY_CARE_PROVIDER_SITE_OTHER): Payer: Self-pay | Admitting: Orthopaedic Surgery

## 2018-05-13 ENCOUNTER — Ambulatory Visit (INDEPENDENT_AMBULATORY_CARE_PROVIDER_SITE_OTHER): Payer: Self-pay

## 2018-05-13 ENCOUNTER — Ambulatory Visit (INDEPENDENT_AMBULATORY_CARE_PROVIDER_SITE_OTHER): Payer: No Typology Code available for payment source | Admitting: Orthopaedic Surgery

## 2018-05-13 DIAGNOSIS — Z6841 Body Mass Index (BMI) 40.0 and over, adult: Secondary | ICD-10-CM | POA: Diagnosis not present

## 2018-05-13 DIAGNOSIS — M545 Low back pain, unspecified: Secondary | ICD-10-CM

## 2018-05-13 DIAGNOSIS — M542 Cervicalgia: Secondary | ICD-10-CM | POA: Diagnosis not present

## 2018-05-13 MED FILL — CYCLOBENZAPRINE 10 MG TAB: 10 | 10 days supply | Qty: 20 | Fill #0

## 2018-05-13 MED FILL — LIDOCAINE PATCH 5%: 5 | 30 days supply | Qty: 30 | Fill #0

## 2018-05-13 NOTE — Progress Notes (Signed)
Office Visit Note   Patient: Alexa West           Date of Birth: Aug 26, 1970           MRN: [id number] Visit Date: 05/13/2018              Requested by: Martinique, Betty G, MD [address] [place], [place] [address] PCP: Martinique, Betty G, MD   Assessment & Plan: Visit Diagnoses:  1. Neck pain   2. Lumbar pain   3. Morbid obesity ([place])   4. Body mass index 50.0-59.9, adult (HCC)   5. Cervicalgia     Plan: Impression is lumbar radiculopathy.  At this point, her pain has been ongoing for over 6 months and I think it is appropriate to obtain an MRI to further assess this.  She will follow-up with [place] once that has been completed.  If this does come down to epidural steroid injections, she would like to go elsewhere for this.  In regards to her neck, she is already on prednisone and muscle relaxer.  Because this is been ongoing for 10 years, we will go ahead and obtain an MRI of her cervical spine.  We will discuss this at follow-up. The patient meets the AMA guidelines for Morbid (severe) obesity with a BMI > 40.0 and I have recommended weight loss.   Follow-Up Instructions: Return in about 10 days (around 05/23/2018) for mri review.   Orders:  Orders Placed This Encounter  Procedures  . XR Cervical Spine 2 or 3 views  . XR Cervical Spine 2 or 3 views  . MR Lumbar Spine w/o contrast  . MR Cervical Spine w/o contrast   No orders of the defined types were placed in this encounter.     Procedures: No procedures performed   Clinical Data: No additional findings.   Subjective: Chief Complaint  Patient presents with  . Left Leg - Pain    HPI patient is a pleasant 47 year old female who presents to our clinic today with left hip pain.  This initially began back in April 2019.  She was seen by Dr. Donavan Burnet where it was noted that she had severe facet arthropathy at L3-S1.  She was sent to Dr. Ernestina Patches for an epidural steroid injection.  She notes very minimal relief of  symptoms.  Over the past month her pain has worsened.  Over the past 5 days has been the most bothersome.  She was unable to work and was seen in the ED.  CT of the pelvis obtained which showed degenerative changes to the thoracolumbar spine but nothing more.  The pain she has is to the posterior lateral leg occasionally radiating into the groin and top of the thigh.  Pain is worse with walking.  No numbness, tingling or burning.  No focal weakness.  No bowel or bladder change.  She has been taking prednisone and muscle relaxer with minimal relief of symptoms since yesterday.  Patient also notes moderate neck pain which has been ongoing for the past 10 years or so.  This is worsened over the past few months.  Pain with lateral rotation to the left.  No history of epidural steroid injection or MRI.  She does occasionally get some tingling down the left arm.  Review of Systems as detailed in HPI.  All others reviewed and are negative.   Objective: Vital Signs: LMP 04/21/2018 (Exact Date)   Physical Exam well-developed well-nourished female no acute distress.  Alert and  oriented x3.  Ortho Exam examination of her lumbar spine reveals minimal pain with lumbar flexion and extension.  Minimally positive straight leg raise on the left.  Negative logroll and negative Fater.  No focal weakness.  EHL and FHL intact.  She is neurovascularly intact distally. Examination of the cervical spine reveals limited range of motion all planes secondary to pain and stiffness.  She does have some periscapular trigger points. Specialty Comments:  No specialty comments available.  Imaging: Xr Cervical Spine 2 Or 3 Views  Result Date: 05/14/2018 X-rays of the cervical spine reveal marked diffuse degenerative disc disease worst at C6-7 with anterior spurring    PMFS History: Patient Active Problem List   Diagnosis Date Noted  . Neck pain 05/13/2018  . Body mass index 50.0-59.9, adult ([facility]) 05/13/2018  . PCOS  (polycystic ovarian syndrome) 12/05/2017  . History of IBS 12/05/2017  . S/P cholecystectomy 12/05/2017  . Gastric ulcer with hemorrhage 12/05/2017  . Eosinophilic esophagitis 37/10/8887  . Morbid obesity ([facility]) 10/13/2017  . Migraine headache without aura 10/13/2017  . GI bleed 08/20/2016  . Transaminitis 08/20/2016  . Prediabetes 08/20/2016  . Lumbar pain 05/24/2014  . Impaired fasting glucose 09/18/2010  . Depression 03/04/2008  . Esophageal reflux 03/04/2008   Past Medical History:  Diagnosis Date  . 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 Anxiety   . Asthma   . Chicken pox   . Depression   . Genital warts   . GERD (gastroesophageal reflux disease)   . History of diverticulitis   . History of genital warts   . Hx: UTI (urinary tract infection)   . Migraines   . Urine incontinence     Family History  Problem Relation Age of Onset  . Arthritis Mother   . Depression Mother   . Hyperlipidemia Mother   . Miscarriages / [place] Mother   . Skin cancer Mother   . Early death Father   . Alcohol abuse Maternal Grandmother   . Arthritis Maternal Grandmother   . Depression Maternal Grandmother   . Diabetes Maternal Grandmother   . Hyperlipidemia Maternal Grandmother   . Hypertension Maternal Grandmother   . Kidney disease Maternal Grandmother   . Arthritis Maternal Grandfather   . Hypertension Maternal Grandfather   . Kidney disease Maternal Grandfather   . Heart attack Maternal Grandfather   . Leukemia Maternal Grandfather   . Bone cancer Maternal Grandfather   . Alcohol abuse Paternal Grandmother   . Cancer Paternal Grandmother   . Alcohol abuse Paternal Grandfather     Past Surgical History:  Procedure Laterality Date  . CERVICAL BIOPSY  W/ LOOP ELECTRODE EXCISION  1996  . CHOLECYSTECTOMY  1997  . HERNIA REPAIR  1976  . TONSILLECTOMY AND ADENOIDECTOMY  1996  . urethral stretching  1980   Social History   Occupational History  . Not on file    Tobacco Use  . Smoking status: Never Smoker  . Smokeless tobacco: Never Used  Substance and Sexual Activity  . Alcohol use: No    Frequency: Never  . Drug use: No  . Sexual activity: Yes    Partners: Male

## 2018-05-14 ENCOUNTER — Telehealth: Payer: Self-pay | Admitting: Emergency Medicine

## 2018-05-14 DIAGNOSIS — K582 Mixed irritable bowel syndrome: Secondary | ICD-10-CM

## 2018-05-15 NOTE — Telephone Encounter (Signed)
Referral faxed to [facility] on 05/12/18.  Call to patient to advise that referral had been placed, however, she should confirm her insurance benefits to ensure that she will have coverage for any appointments at [facility] as she has the [facility].  Patient states she will check before she schedules any appointments.  Referral entered into Epic.   cc Lerry Liner, Magdalene Patricia.  Routing to provider and will close encounter.

## 2018-05-21 MED FILL — GLYCOPYRROLATE 2 MG TABS: 2 | 30 days supply | Qty: 30 | Fill #4

## 2018-05-23 ENCOUNTER — Ambulatory Visit
Admission: RE | Admit: 2018-05-23 | Discharge: 2018-05-23 | Disposition: A | Discharge status: Home / Self Care | Payer: No Typology Code available for payment source | Source: Ambulatory Visit | Attending: Physician Assistant | Admitting: Physician Assistant

## 2018-05-23 DIAGNOSIS — M545 Low back pain, unspecified: Secondary | ICD-10-CM

## 2018-05-23 DIAGNOSIS — M542 Cervicalgia: Secondary | ICD-10-CM

## 2018-05-26 ENCOUNTER — Ambulatory Visit (INDEPENDENT_AMBULATORY_CARE_PROVIDER_SITE_OTHER): Payer: No Typology Code available for payment source | Admitting: Orthopedic Surgery

## 2018-05-26 ENCOUNTER — Encounter (INDEPENDENT_AMBULATORY_CARE_PROVIDER_SITE_OTHER): Payer: Self-pay | Admitting: Orthopedic Surgery

## 2018-05-26 ENCOUNTER — Ambulatory Visit (INDEPENDENT_AMBULATORY_CARE_PROVIDER_SITE_OTHER): Payer: No Typology Code available for payment source | Admitting: Physician Assistant

## 2018-05-26 VITALS — Ht 65.0 in | Wt 309.0 lb

## 2018-05-26 DIAGNOSIS — M545 Low back pain, unspecified: Secondary | ICD-10-CM

## 2018-05-26 DIAGNOSIS — Z6841 Body Mass Index (BMI) 40.0 and over, adult: Secondary | ICD-10-CM

## 2018-05-26 DIAGNOSIS — M542 Cervicalgia: Secondary | ICD-10-CM

## 2018-05-26 MED ORDER — PREDNISONE 10 MG PO TABS: 20.0000 mg | ORAL_TABLET | Freq: Every day | ORAL | 0 refills | Status: DC

## 2018-05-26 MED ORDER — GABAPENTIN 300 MG PO CAPS: 300.0000 mg | ORAL_CAPSULE | Freq: Every day | ORAL | 3 refills | Status: DC

## 2018-05-26 MED FILL — predniSONE 10 MG TABS: 10 | 30 days supply | Qty: 60 | Fill #0

## 2018-05-26 MED FILL — GABAPENTIN 300 MG CAPSULE: 300 | 30 days supply | Qty: 90 | Fill #0

## 2018-05-26 NOTE — Progress Notes (Signed)
Office Visit Note   Patient: Alexa West           Date of Birth: 11/08/1970           MRN: [id number] Visit Date: 05/26/2018              Requested by: Martinique, Betty G, MD [address] [place], [place] [address] PCP: Martinique, Betty G, MD  Chief Complaint  Patient presents with  . Lower Back - Follow-up    MRI review       HPI: Patient is a 47 year old woman with a history of neck pain and back pain.  She states the back pain radiates to the groin bilaterally.  Patient states that she has been on meloxicam and her GI doctor took her off the meloxicam put on Naprosyn she states she has been hospitalized for a bleeding ulcer.  She has taken Robaxin and Flexeril and is currently using a TENS unit.  She states she has had an epidural steroid injection with Dr. Ernestina Patches and has had previous injections in the past with temporary relief.  She states she took a 5-day course of prednisone without relief and tried Neurontin as well.  Assessment & Plan: Visit Diagnoses:  1. Lumbar pain   2. Morbid obesity ([place])   3. Body mass index 50.0-59.9, adult (HCC)   4. Cervicalgia     Plan: Patient is called in a prescription for Neurontin 300 mg nightly and prednisone 20 mg with breakfast daily.  Follow-up with Mendel Ryder.  Patient does not want to consider another epidural steroid injection at this time.    Follow-Up Instructions: Return in about 4 weeks (around 06/23/2018).   Ortho Exam  Patient is alert, oriented, no adenopathy, well-dressed, normal affect, normal respiratory effort. Examination patient has no focal motor weakness in her upper extremities.  Lower extremities she has a mildly positive sciatic stretch test on the right no focal motor weakness in either lower extremity.  Review of the MRI scan of her cervical spine shows moderate left C6 neuroforaminal stenosis secondary to subarticular disc protrusion and facet hypertrophy and mild bilateral C7 and right C4 neural  foraminal stenosis.  Review of the MRI scan of the lumbar spine shows facet arthropathy at L4-5 and foraminal narrowing at L5-S1.  Imaging: No results found. No images are attached to the encounter.  Labs: Lab Results  Component Value Date   HGBA1C 5.6 10/13/2017   ESRSEDRATE 15 12/05/2017     Lab Results  Component Value Date   ALBUMIN 3.8 05/09/2018   ALBUMIN 3.6 12/05/2017    Body mass index is 51.42 kg/m.  Orders:  No orders of the defined types were placed in this encounter.  Meds ordered this encounter  Medications  . gabapentin (NEURONTIN) 300 MG capsule    Sig: Take 1 capsule (300 mg total) by mouth at bedtime. 3 times a day when necessary neuropathy pain    Dispense:  90 capsule    Refill:  3  . predniSONE (DELTASONE) 10 MG tablet    Sig: Take 2 tablets (20 mg total) by mouth daily with breakfast.    Dispense:  60 tablet    Refill:  0     Procedures: No procedures performed  Clinical Data: No additional findings.  ROS:  All other systems negative, except as noted in the HPI. Review of Systems  Objective: Vital Signs: Ht 5\' 5"  (1.651 m)   Wt (!) 309 lb (140.2 kg)   BMI  51.42 kg/m   Specialty Comments:  No specialty comments available.  PMFS History: Patient Active Problem List   Diagnosis Date Noted  . Neck pain 05/13/2018  . Body mass index 50.0-59.9, adult ([facility]) 05/13/2018  . PCOS (polycystic ovarian syndrome) 12/05/2017  . History of IBS 12/05/2017  . S/P cholecystectomy 12/05/2017  . Gastric ulcer with hemorrhage 12/05/2017  . Eosinophilic esophagitis 00/17/4944  . Morbid obesity ([place]) 10/13/2017  . Migraine headache without aura 10/13/2017  . GI bleed 08/20/2016  . Transaminitis 08/20/2016  . Prediabetes 08/20/2016  . Lumbar pain 05/24/2014  . Impaired fasting glucose 09/18/2010  . Depression 03/04/2008  . Esophageal reflux 03/04/2008   Past Medical History:  Diagnosis Date  . 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  Anxiety   . Asthma   . Chicken pox   . Depression   . Genital warts   . GERD (gastroesophageal reflux disease)   . History of diverticulitis   . History of genital warts   . Hx: UTI (urinary tract infection)   . Migraines   . Urine incontinence     Family History  Problem Relation Age of Onset  . Arthritis Mother   . Depression Mother   . Hyperlipidemia Mother   . Miscarriages / [place] Mother   . Skin cancer Mother   . Early death Father   . Alcohol abuse Maternal Grandmother   . Arthritis Maternal Grandmother   . Depression Maternal Grandmother   . Diabetes Maternal Grandmother   . Hyperlipidemia Maternal Grandmother   . Hypertension Maternal Grandmother   . Kidney disease Maternal Grandmother   . Arthritis Maternal Grandfather   . Hypertension Maternal Grandfather   . Kidney disease Maternal Grandfather   . Heart attack Maternal Grandfather   . Leukemia Maternal Grandfather   . Bone cancer Maternal Grandfather   . Alcohol abuse Paternal Grandmother   . Cancer Paternal Grandmother   . Alcohol abuse Paternal Grandfather     Past Surgical History:  Procedure Laterality Date  . CERVICAL BIOPSY  W/ LOOP ELECTRODE EXCISION  1996  . CHOLECYSTECTOMY  1997  . HERNIA REPAIR  1976  . TONSILLECTOMY AND ADENOIDECTOMY  1996  . urethral stretching  1980   Social History   Occupational History  . Not on file  Tobacco Use  . Smoking status: Never Smoker  . Smokeless tobacco: Never Used  Substance and Sexual Activity  . Alcohol use: No    Frequency: Never  . Drug use: No  . Sexual activity: Yes    Partners: Male

## 2018-05-27 ENCOUNTER — Telehealth: Payer: Self-pay | Admitting: Obstetrics & Gynecology

## 2018-05-27 ENCOUNTER — Encounter: Payer: Self-pay | Admitting: Family Medicine

## 2018-05-27 NOTE — Telephone Encounter (Signed)
Dr. Sabra Heck,  I received a fax from [facility] they have attempted to schedule the patient. They have called multiple times no response. I have also called the patient twice and left a message for a return call no response.  [employer]

## 2018-05-27 NOTE — Telephone Encounter (Signed)
Call placed in reference to a [facility] referral.

## 2018-05-28 NOTE — Progress Notes (Signed)
F/u appt ?

## 2018-05-29 ENCOUNTER — Other Ambulatory Visit: Payer: Self-pay | Admitting: Family Medicine

## 2018-05-29 DIAGNOSIS — N3281 Overactive bladder: Secondary | ICD-10-CM

## 2018-06-09 ENCOUNTER — Encounter: Payer: Self-pay | Admitting: Obstetrics & Gynecology

## 2018-06-12 ENCOUNTER — Other Ambulatory Visit: Payer: Self-pay | Admitting: Obstetrics & Gynecology

## 2018-06-15 NOTE — Telephone Encounter (Signed)
Medication refill request:Aldactone  Last AEX:  03/05/18 Next AEX: Nothing Scheduled at this time. Last MMG (if hormonal medication request):02/13/18   Bi-rads 1 Neg  Refill authorized: #[id number] with 1RF

## 2018-06-17 ENCOUNTER — Other Ambulatory Visit: Payer: Self-pay | Admitting: Obstetrics & Gynecology

## 2018-06-17 MED ORDER — SPIRONOLACTONE 50 MG PO TABS: 50.0000 mg | ORAL_TABLET | Freq: Two times a day (BID) | ORAL | 2 refills | Status: DC

## 2018-06-17 MED FILL — SERTRALINE HCL 100 MG TAB: 100 | 90 days supply | Qty: 90 | Fill #1

## 2018-06-17 MED FILL — GLYCOPYRROLATE 2 MG TABS: 2 | 30 days supply | Qty: 30 | Fill #5

## 2018-06-18 MED FILL — SPIRONOLACTONE 50 MG TABLET: 50 | 90 days supply | Qty: 180 | Fill #0

## 2018-06-23 ENCOUNTER — Ambulatory Visit (INDEPENDENT_AMBULATORY_CARE_PROVIDER_SITE_OTHER): Payer: No Typology Code available for payment source | Admitting: Orthopaedic Surgery

## 2018-06-23 ENCOUNTER — Encounter (INDEPENDENT_AMBULATORY_CARE_PROVIDER_SITE_OTHER): Payer: Self-pay | Admitting: Orthopaedic Surgery

## 2018-06-23 DIAGNOSIS — Z6841 Body Mass Index (BMI) 40.0 and over, adult: Secondary | ICD-10-CM | POA: Diagnosis not present

## 2018-06-23 DIAGNOSIS — M545 Low back pain, unspecified: Secondary | ICD-10-CM

## 2018-06-23 DIAGNOSIS — M542 Cervicalgia: Secondary | ICD-10-CM | POA: Diagnosis not present

## 2018-06-23 MED ORDER — TRAMADOL HCL 50 MG PO TABS: 50.0000 mg | ORAL_TABLET | Freq: Three times a day (TID) | ORAL | 2 refills | Status: DC | PRN

## 2018-06-23 NOTE — Progress Notes (Signed)
Office Visit Note   Patient: Alexa West           Date of Birth: [birth year]           MRN: [id number] Visit Date: 06/23/2018              Requested by: Martinique, Betty G, MD [address] [place], [place] [address] PCP: Martinique, Betty G, MD   Assessment & Plan: Visit Diagnoses:  1. Lumbar pain   2. Cervicalgia   3. Body mass index 50.0-59.9, adult ([place])   4. Morbid obesity ([place])     Plan: Cervical and lumbar spine MRI reveal mild degenerative disc disease and areas of neuroforaminal stenosis.  These findings were reviewed with the patient.  I recommend referral to Dr. Ernestina Patches for epidural steroid injection.  Also discussed the importance of weight loss.  A prescription for tramadol was given today for breakthrough pain.  Follow-up as needed. The patient meets the AMA guidelines for Morbid (severe) obesity with a BMI > 40.0 and I have recommended weight loss.  Follow-Up Instructions: Return if symptoms worsen or fail to improve.   Orders:  Orders Placed This Encounter  Procedures  . Ambulatory referral to Physical Medicine Rehab  . Ambulatory referral to Physical Medicine Rehab   Meds ordered this encounter  Medications  . traMADol (ULTRAM) 50 MG tablet    Sig: Take 1-2 tablets (50-100 mg total) by mouth 3 (three) times daily as needed.    Dispense:  30 tablet    Refill:  2      Procedures: No procedures performed   Clinical Data: No additional findings.   Subjective: Chief Complaint  Patient presents with  . Lower Back - Pain, Follow-up  . Neck - Pain, Follow-up    Alexa West returns today for MRI review of her cervical and lumbar spine.   Review of Systems   Objective: Vital Signs: There were no vitals taken for this visit.  Physical Exam  Ortho Exam Musculoskeletal exam stable Specialty Comments:  No specialty comments available.  Imaging: No results found.   PMFS History: Patient Active Problem List   Diagnosis Date Noted  . Neck  pain 05/13/2018  . Body mass index 50.0-59.9, adult ([place]) 05/13/2018  . PCOS (polycystic ovarian syndrome) 12/05/2017  . History of IBS 12/05/2017  . S/P cholecystectomy 12/05/2017  . Gastric ulcer with hemorrhage 12/05/2017  . Eosinophilic esophagitis 16/07/930  . Morbid obesity ([place]) 10/13/2017  . Migraine headache without aura 10/13/2017  . GI bleed 08/20/2016  . Transaminitis 08/20/2016  . Prediabetes 08/20/2016  . Lumbar pain 05/24/2014  . Impaired fasting glucose 09/18/2010  . Depression 03/04/2008  . Esophageal reflux 03/04/2008   Past Medical History:  Diagnosis Date  . 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 Anxiety   . Asthma   . Chicken pox   . Depression   . Genital warts   . GERD (gastroesophageal reflux disease)   . History of diverticulitis   . History of genital warts   . Hx: UTI (urinary tract infection)   . Migraines   . Urine incontinence     Family History  Problem Relation Age of Onset  . Arthritis Mother   . Depression Mother   . Hyperlipidemia Mother   . Miscarriages / [place] Mother   . Skin cancer Mother   . Early death Father   . Alcohol abuse Maternal Grandmother   . Arthritis Maternal Grandmother   .  Depression Maternal Grandmother   . Diabetes Maternal Grandmother   . Hyperlipidemia Maternal Grandmother   . Hypertension Maternal Grandmother   . Kidney disease Maternal Grandmother   . Arthritis Maternal Grandfather   . Hypertension Maternal Grandfather   . Kidney disease Maternal Grandfather   . Heart attack Maternal Grandfather   . Leukemia Maternal Grandfather   . Bone cancer Maternal Grandfather   . Alcohol abuse Paternal Grandmother   . Cancer Paternal Grandmother   . Alcohol abuse Paternal Grandfather     Past Surgical History:  Procedure Laterality Date  . CERVICAL BIOPSY  W/ LOOP ELECTRODE EXCISION  1996  . CHOLECYSTECTOMY  1997  . HERNIA REPAIR  1976  . TONSILLECTOMY AND ADENOIDECTOMY  1996  . urethral  stretching  1980   Social History   Occupational History  . Not on file  Tobacco Use  . Smoking status: Never Smoker  . Smokeless tobacco: Never Used  Substance and Sexual Activity  . Alcohol use: No    Frequency: Never  . Drug use: No  . Sexual activity: Yes    Partners: Male

## 2018-07-10 MED FILL — GABAPENTIN 300 MG CAPSULE: 300 | 30 days supply | Qty: 90 | Fill #1

## 2018-07-10 MED FILL — DEXILANT DR 60 MG CAPSULE: 60 | 30 days supply | Qty: 30 | Fill #1

## 2018-07-10 MED FILL — RIZATRIPTAN 10 MG ODT: 10 | 30 days supply | Qty: 10 | Fill #1

## 2018-07-27 MED FILL — GLYCOPYRROLATE 2 MG TABS: 2 | 30 days supply | Qty: 30 | Fill #6

## 2018-08-12 ENCOUNTER — Ambulatory Visit: Payer: No Typology Code available for payment source | Admitting: Family Medicine

## 2018-08-12 MED FILL — RIZATRIPTAN 10 MG ODT: 10 | 30 days supply | Qty: 10 | Fill #2

## 2018-08-12 MED FILL — DEXILANT DR 60 MG CAPSULE: 60 | 30 days supply | Qty: 30 | Fill #2

## 2018-08-12 MED FILL — GABAPENTIN 300 MG CAPSULE: 300 | 30 days supply | Qty: 90 | Fill #2

## 2018-08-19 ENCOUNTER — Other Ambulatory Visit: Payer: Self-pay | Admitting: Gastroenterology

## 2018-08-19 DIAGNOSIS — R1310 Dysphagia, unspecified: Secondary | ICD-10-CM

## 2018-08-21 ENCOUNTER — Ambulatory Visit: Payer: No Typology Code available for payment source | Admitting: Family Medicine

## 2018-08-21 ENCOUNTER — Encounter: Payer: Self-pay | Admitting: Family Medicine

## 2018-08-21 ENCOUNTER — Ambulatory Visit (INDEPENDENT_AMBULATORY_CARE_PROVIDER_SITE_OTHER): Payer: No Typology Code available for payment source | Admitting: Family Medicine

## 2018-08-21 VITALS — BP 110/70 | HR 78 | Temp 98.7°F | Wt 311.0 lb

## 2018-08-21 DIAGNOSIS — M67431 Ganglion, right wrist: Secondary | ICD-10-CM

## 2018-08-21 DIAGNOSIS — D1722 Benign lipomatous neoplasm of skin and subcutaneous tissue of left arm: Secondary | ICD-10-CM | POA: Diagnosis not present

## 2018-08-21 DIAGNOSIS — R0982 Postnasal drip: Secondary | ICD-10-CM | POA: Diagnosis not present

## 2018-08-21 NOTE — Progress Notes (Signed)
Subjective:    Patient ID: Alexa West, female    DOB: [birth year], 48 y.o.   MRN: [id number]  No chief complaint on file.   HPI Patient was seen today for acute concern.  Pt is typically seen by Dr. Martinique.  Pt endorses cough, hoarse voice, rhinorrhea, intermittent ear pain, sore throat x2 weeks.  Pt denies fever, chills, headache, nausea, vomiting.  Pt tried Mucinex for symptoms.  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  Pt also notes a lump in left arm, nonpainful, nonpruritic x1 week.  Right wrist with ganglion cyst, currently nonpainful.  Pt had cyst drained in the past.  Past Medical History:  Diagnosis Date  . 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 Anxiety   . Asthma   . Chicken pox   . Depression   . Genital warts   . GERD (gastroesophageal reflux disease)   . History of diverticulitis   . History of genital warts   . Hx: UTI (urinary tract infection)   . Migraines   . Urine incontinence     Allergies  Allergen Reactions  . Cashew Nut Oil   . Milk-Related Compounds Diarrhea  . Wheat Bran   . Cephalosporins Hives and Rash  . Penicillins Hives and Rash    ROS General: Denies fever, chills, night sweats, changes in weight, changes in appetite HEENT: Denies headaches, ear pain, changes in vision, rhinorrhea, sore throat  + sore throat, hoarse voice, rhinorrhea, R ear pain CV: Denies CP, palpitations, SOB, orthopnea Pulm: Denies SOB, wheezing  + cough GI: Denies abdominal pain, nausea, vomiting, diarrhea, constipation GU: Denies dysuria, hematuria, frequency, vaginal discharge Msk: Denies muscle cramps, joint pains Neuro: Denies weakness, numbness, tingling Skin: Denies rashes, bruising  + lump in left arm Psych: Denies depression, anxiety, hallucinations   Objective:    Blood pressure 110/70, pulse 78, temperature 98.7 F (37.1 C), temperature source Oral, weight (!) 311 lb (141.1 kg), SpO2 98 %.  Gen.  Pleasant, well-nourished, in no distress, normal affect   HEENT: [place]/AT, face symmetric, no scleral icterus, PERRLA, nares patent without drainage, pharynx with postnasal drainage, no erythema or exudate.  TMs full bilaterally.  No cervical lymphadenopathy. Lungs: no accessory muscle use, CTAB, no wheezes or rales Cardiovascular: RRR, no m/r/g, no peripheral edema Neuro:  A&Ox3, CN II-XII intact, normal gait Skin:  Warm, no lesions/ rash.  Well-circumscribed deposition of fatty tissue in left forearm 3 cm x 4 cm.  Ganglion cyst dorsum of right wrist 1 cm.  Wt Readings from Last 3 Encounters:  08/21/18 (!) 311 lb (141.1 kg)  05/26/18 (!) 309 lb (140.2 kg)  05/09/18 (!) 309 lb 1.4 oz (140.2 kg)    Lab Results  Component Value Date   WBC 10.1 05/09/2018   HGB 13.4 05/09/2018   HCT 43.4 05/09/2018   PLT 294 05/09/2018   GLUCOSE 89 05/09/2018   CHOL 159 01/02/2018   TRIG 76.0 01/02/2018   HDL 46.70 01/02/2018   LDLCALC 97 01/02/2018   ALT 30 05/09/2018   AST 40 05/09/2018   NA 135 05/09/2018   K 4.1 05/09/2018   CL 104 05/09/2018   CREATININE 0.88 05/09/2018   BUN 9 05/09/2018   CO2 23 05/09/2018   TSH 2.840 05/05/2018   HGBA1C 5.6 10/13/2017    Assessment/Plan:  Post-nasal drainage -Likely causing cough, throat irritation, hoarseness in voice -Discussed treatment with OTC antihistamine allergy  medication such as Zyrtec -Discussed nasal spray, however patient declines at this time  Lipoma of left upper extremity -Given handout -Discussed removal if becomes painful, larger, or for cosmetic reasons  Ganglion cyst of dorsum of right wrist -Discussed treatment options including drainage versus surgical removal -Continue to monitor -Given handout  F/u prn   Grier Mitts, MD

## 2018-08-21 NOTE — Patient Instructions (Signed)
Postnasal Drip Postnasal drip is the feeling of mucus going down the back of your throat. Mucus is a slimy substance that moistens and cleans your nose and throat, as well as the air pockets in face bones near your forehead and cheeks (sinuses). Small amounts of mucus pass from your nose and sinuses down the back of your throat all the time. This is normal. When you produce too much mucus or the mucus gets too thick, you can feel it. Some common causes of postnasal drip include:  Having more mucus because of: ? A cold or the flu. ? Allergies. ? Cold air. ? Certain medicines.  Having more mucus that is thicker because of: ? A sinus or nasal infection. ? Dry air. ? A food allergy. Follow these instructions at home: Relieving discomfort   Gargle with a salt-water mixture 3-4 times a day or as needed. To make a salt-water mixture, completely dissolve -1 tsp of salt in 1 cup of warm water.  If the air in your home is dry, use a humidifier to add moisture to the air.  Use a saline spray or container (neti pot) to flush out the nose (nasal irrigation). These methods can help clear away mucus and keep the nasal passages moist. General instructions  Take over-the-counter and prescription medicines only as told by your health care provider.  Follow instructions from your health care provider about eating or drinking restrictions. You may need to avoid caffeine.  Avoid things that you know you are allergic to (allergens), like dust, mold, pollen, pets, or certain foods.  Drink enough fluid to keep your urine pale yellow.  Keep all follow-up visits as told by your health care provider. This is important. Contact a health care provider if:  You have a fever.  You have a sore throat.  You have difficulty swallowing.  You have headache.  You have sinus pain.  You have a cough that does not go away.  The mucus from your nose becomes thick and is green or yellow in color.  You have  cold or flu symptoms that last more than 10 days. Summary  Postnasal drip is the feeling of mucus going down the back of your throat.  If your health care provider approves, use nasal irrigation or a nasal spray 2?4 times a day.  Avoid things that you know you are allergic to (allergens), like dust, mold, pollen, pets, or certain foods. This information is not intended to replace advice given to you by your health care provider. Make sure you discuss any questions you have with your health care provider. Document Released: 10/28/2016 Document Revised: 10/28/2016 Document Reviewed: 10/28/2016 Elsevier Interactive Patient Education  2019 [employer]  Lipoma  A lipoma is a noncancerous (benign) tumor that is made up of fat cells. This is a very common type of soft-tissue growth. Lipomas are usually found under the skin (subcutaneous). They may occur in any tissue of the body that contains fat. Common areas for lipomas to appear include the back, shoulders, buttocks, and thighs.  Lipomas grow slowly, and they are usually painless. Most lipomas do not cause problems and do not require treatment. What are the causes? The cause of this condition is not known. What increases the risk? You are more likely to develop this condition if:  You are 44-14 years old.  You have a family history of lipomas. What are the signs or symptoms? A lipoma usually appears as a small, round bump under the skin.  In most cases, the lump will:  Feel soft or rubbery.  Not cause pain or other symptoms. However, if a lipoma is located in an area where it pushes on nerves, it can become painful or cause other symptoms. How is this diagnosed? A lipoma can usually be diagnosed with a physical exam. You may also have tests to confirm the diagnosis and to rule out other conditions. Tests may include:  Imaging tests, such as a CT scan or MRI.  Removal of a tissue sample to be looked at under a microscope  (biopsy). How is this treated? Treatment for this condition depends on the size of the lipoma and whether it is causing any symptoms.  For small lipomas that are not causing problems, no treatment is needed.  If a lipoma is bigger or it causes problems, surgery may be done to remove the lipoma. Lipomas can also be removed to improve appearance. Most often, the procedure is done after applying a medicine that numbs the area (local anesthetic). Follow these instructions at home:  Watch your lipoma for any changes.  Keep all follow-up visits as told by your health care provider. This is important. Contact a health care provider if:  Your lipoma becomes larger or hard.  Your lipoma becomes painful, red, or increasingly swollen. These could be signs of infection or a more serious condition. Get help right away if:  You develop tingling or numbness in an area near the lipoma. This could indicate that the lipoma is causing nerve damage. Summary  A lipoma is a noncancerous tumor that is made up of fat cells.  Most lipomas do not cause problems and do not require treatment.  If a lipoma is bigger or it causes problems, surgery may be done to remove the lipoma. This information is not intended to replace advice given to you by your health care provider. Make sure you discuss any questions you have with your health care provider. Document Released: 07/05/2002 Document Revised: 07/01/2017 Document Reviewed: 07/01/2017 Elsevier Interactive Patient Education  2019 [employer]  Ganglion Cyst  A ganglion cyst is a non-cancerous, fluid-filled lump that occurs near a joint or tendon. The cyst grows out of a joint or the lining of a tendon. Ganglion cysts most often develop in the hand or wrist, but they can also develop in the shoulder, elbow, hip, knee, ankle, or foot. Ganglion cysts are ball-shaped or egg-shaped. Their size can range from the size of a pea to larger than a grape. Increased  activity may cause the cyst to get bigger because more fluid starts to build up. What are the causes? The exact cause of this condition is not known, but it may be related to:  Inflammation or irritation around the joint.  An injury.  Repetitive movements or overuse.  Arthritis. What increases the risk? You are more likely to develop this condition if:  You are a woman.  You are 62-80 years old. What are the signs or symptoms? The main symptom of this condition is a lump. It most often appears on the hand or wrist. In many cases, there are no other symptoms, but a cyst can sometimes cause:  Tingling.  Pain.  Numbness.  Muscle weakness.  Weak grip.  Less range of motion in a joint. How is this diagnosed? Ganglion cysts are usually diagnosed based on a physical exam. Your health care provider will feel the lump and may shine a light next to it. If it is a ganglion cyst,  the light will likely shine through it. Your health care provider may order an X-ray, ultrasound, or MRI to rule out other conditions. How is this treated? Ganglion cysts often go away on their own without treatment. If you have pain or other symptoms, treatment may be needed. Treatment is also needed if the ganglion cyst limits your movement or if it gets infected. Treatment may include:  Wearing a brace or splint on your wrist or finger.  Taking anti-inflammatory medicine.  Having fluid drained from the lump with a needle (aspiration).  Getting a steroid injected into the joint.  Having surgery to remove the ganglion cyst.  Placing a pad on your shoe or wearing shoes that will not rub against the cyst if it is on your foot. Follow these instructions at home:  Do not press on the ganglion cyst, poke it with a needle, or hit it.  Take over-the-counter and prescription medicines only as told by your health care provider.  If you have a brace or splint: ? Wear it as told by your health care  provider. ? Remove it as told by your health care provider. Ask if you need to remove it when you take a shower or a bath.  Watch your ganglion cyst for any changes.  Keep all follow-up visits as told by your health care provider. This is important. Contact a health care provider if:  Your ganglion cyst becomes larger or more painful.  You have pus coming from the lump.  You have weakness or numbness in the affected area.  You have a fever or chills. Get help right away if:  You have a fever and have any of these in the cyst area: ? Increased redness. ? Red streaks. ? Swelling. Summary  A ganglion cyst is a non-cancerous, fluid-filled lump that occurs near a joint or tendon.  Ganglion cysts most often develop in the hand or wrist, but they can also develop in the shoulder, elbow, hip, knee, ankle, or foot.  Ganglion cysts often go away on their own without treatment. This information is not intended to replace advice given to you by your health care provider. Make sure you discuss any questions you have with your health care provider. Document Released: 07/12/2000 Document Revised: 03/14/2017 Document Reviewed: 03/14/2017 Elsevier Interactive Patient Education  2019 [employer].

## 2018-09-04 ENCOUNTER — Other Ambulatory Visit: Payer: Self-pay | Admitting: Physician Assistant

## 2018-09-04 MED FILL — GLYCOPYRROLATE 2 MG TABS: 2 | 30 days supply | Qty: 30 | Fill #0

## 2018-09-28 ENCOUNTER — Encounter: Payer: Self-pay | Admitting: Family Medicine

## 2018-09-29 ENCOUNTER — Other Ambulatory Visit: Payer: Self-pay | Admitting: *Deleted

## 2018-09-29 DIAGNOSIS — M25571 Pain in right ankle and joints of right foot: Secondary | ICD-10-CM

## 2018-09-29 DIAGNOSIS — M25572 Pain in left ankle and joints of left foot: Principal | ICD-10-CM

## 2018-09-29 DIAGNOSIS — M79671 Pain in right foot: Secondary | ICD-10-CM

## 2018-10-01 ENCOUNTER — Encounter: Payer: Self-pay | Admitting: Podiatry

## 2018-10-01 ENCOUNTER — Ambulatory Visit (INDEPENDENT_AMBULATORY_CARE_PROVIDER_SITE_OTHER): Payer: No Typology Code available for payment source

## 2018-10-01 ENCOUNTER — Other Ambulatory Visit: Payer: Self-pay | Admitting: Podiatry

## 2018-10-01 ENCOUNTER — Ambulatory Visit: Payer: No Typology Code available for payment source | Admitting: Podiatry

## 2018-10-01 DIAGNOSIS — M779 Enthesopathy, unspecified: Secondary | ICD-10-CM

## 2018-10-01 DIAGNOSIS — M25572 Pain in left ankle and joints of left foot: Secondary | ICD-10-CM

## 2018-10-01 DIAGNOSIS — M25571 Pain in right ankle and joints of right foot: Secondary | ICD-10-CM

## 2018-10-01 MED ORDER — TRIAMCINOLONE ACETONIDE 10 MG/ML IJ SUSP
10.0000 mg | Freq: Once | INTRAMUSCULAR | Status: AC
  Administered 2018-10-01: 10 mg

## 2018-10-04 NOTE — Progress Notes (Signed)
Subjective:   Patient ID: Alexa West, female   DOB: 48 y.o.   MRN: [id number]   HPI Patient states her ankles have been hurting her a lot and patient is obese which is a complicating factor.  Patient states that it is gotten worse over the last couple months   ROS      Objective:  Physical Exam  Neurovascular status intact with exquisite discomfort in the sinus tarsi bilateral with inflammation fluid buildup of the sinus tarsi bilateral     Assessment:  Acute sinus tarsitis capsulitis bilateral     Plan:  H&P condition reviewed and at this point I did sterile prep and injected the sinus tarsi bilateral 3 mg Kenalog 5 mg Xylocaine applied sterile dressing and instructed on reduced activity for several days and that other treatments may be necessary in future  X-rays indicate that there is depression of the arch but no indications of advancement of deformity or pathology with spur formation noted

## 2018-10-07 MED FILL — GLYCOPYRROLATE 2 MG TABS: 2 | 30 days supply | Qty: 30 | Fill #1 | Status: TO

## 2018-10-07 MED FILL — GABAPENTIN 300 MG CAPSULE: 300 | 30 days supply | Qty: 90 | Fill #3

## 2018-10-07 MED FILL — SERTRALINE HCL 100 MG TAB: 100 | 90 days supply | Qty: 90 | Fill #2

## 2018-10-07 MED FILL — SPIRONOLACTONE 50 MG TABLET: 50 | 90 days supply | Qty: 180 | Fill #1

## 2018-10-07 MED FILL — DEXILANT DR 60 MG CAPSULE: 60 | 30 days supply | Qty: 30 | Fill #3 | Status: TO

## 2018-10-09 ENCOUNTER — Other Ambulatory Visit: Payer: Self-pay

## 2018-10-09 ENCOUNTER — Ambulatory Visit (INDEPENDENT_AMBULATORY_CARE_PROVIDER_SITE_OTHER): Payer: No Typology Code available for payment source | Admitting: Internal Medicine

## 2018-10-09 ENCOUNTER — Encounter: Payer: Self-pay | Admitting: Internal Medicine

## 2018-10-09 VITALS — BP 120/80 | HR 56 | Temp 98.4°F | Wt 301.8 lb

## 2018-10-09 DIAGNOSIS — M25571 Pain in right ankle and joints of right foot: Secondary | ICD-10-CM

## 2018-10-09 DIAGNOSIS — W19XXXA Unspecified fall, initial encounter: Secondary | ICD-10-CM | POA: Diagnosis not present

## 2018-10-09 DIAGNOSIS — M25561 Pain in right knee: Secondary | ICD-10-CM

## 2018-10-09 NOTE — Progress Notes (Signed)
Established Patient Office Visit     CC/Reason for Visit: Mechanical fall last week with pain of right knee and ankle  HPI: Alexa West is a 48 y.o. female who is coming in today for the above mentioned reasons.  Last week she suffered a mechanical fall where she stepped off a curb with her left foot and ended up hitting the concrete on her right side.  She subsequently developed edema and bruising of her knee all the way down to her ankle (see below picture for details).  Prior to this visit she had a scheduled appointment with her podiatrist who did ankle x-rays without fracture.  She is able to walk without a pronounced limp, has pain with extreme ranges of motions of the knee.  She would like to have this evaluated and wonders whether she needs any x-rays of her knee.   Past Medical/Surgical History: Past Medical History:  Diagnosis Date  . 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 Anxiety   . Asthma   . Chicken pox   . Depression   . Genital warts   . GERD (gastroesophageal reflux disease)   . History of diverticulitis   . History of genital warts   . Hx: UTI (urinary tract infection)   . Migraines   . Urine incontinence     Past Surgical History:  Procedure Laterality Date  . CERVICAL BIOPSY  W/ LOOP ELECTRODE EXCISION  1996  . CHOLECYSTECTOMY  1997  . HERNIA REPAIR  1976  . TONSILLECTOMY AND ADENOIDECTOMY  1996  . urethral stretching  1980    Social History:  reports that she has never smoked. She has never used smokeless tobacco. She reports that she does not drink alcohol or use drugs.  Allergies: Allergies  Allergen Reactions  . Cashew Nut Oil   . Milk-Related Compounds Diarrhea  . Wheat Bran   . Cephalosporins Hives and Rash  . Penicillins Hives and Rash    Family History:  Family History  Problem Relation Age of Onset  . Arthritis Mother   . Depression Mother   . Hyperlipidemia Mother   . Miscarriages / [place] Mother   .  Skin cancer Mother   . Early death Father   . Alcohol abuse Maternal Grandmother   . Arthritis Maternal Grandmother   . Depression Maternal Grandmother   . Diabetes Maternal Grandmother   . Hyperlipidemia Maternal Grandmother   . Hypertension Maternal Grandmother   . Kidney disease Maternal Grandmother   . Arthritis Maternal Grandfather   . Hypertension Maternal Grandfather   . Kidney disease Maternal Grandfather   . Heart attack Maternal Grandfather   . Leukemia Maternal Grandfather   . Bone cancer Maternal Grandfather   . Alcohol abuse Paternal Grandmother   . Cancer Paternal Grandmother   . Alcohol abuse Paternal Grandfather      Current Outpatient Medications:  .  acetaminophen (TYLENOL) 500 MG tablet, Take 500 mg by mouth every 8 (eight) hours as needed., Disp: , Rfl:  .  albuterol (VENTOLIN HFA) 108 (90 Base) MCG/ACT inhaler, as needed., Disp: , Rfl:  .  AUVI-Q 0.3 MG/0.3ML SOAJ injection, , Disp: , Rfl:  .  DEXILANT 60 MG capsule, daily as needed., Disp: , Rfl: 12 .  diazepam (VALIUM) 10 MG tablet, TK 1 T PO TID PRF BACK SPASMS, Disp: , Rfl: 0 .  diclofenac sodium (VOLTAREN) 1 % GEL, Apply 4 g topically 4 (four) times daily.,  Disp: 4 Tube, Rfl: 3 .  diclofenac sodium (VOLTAREN) 1 % GEL, Apply 2 g topically 4 (four) times daily., Disp: 100 g, Rfl: 0 .  gabapentin (NEURONTIN) 300 MG capsule, Take 1 capsule (300 mg total) by mouth at bedtime. 3 times a day when necessary neuropathy pain, Disp: 90 capsule, Rfl: 3 .  glycopyrrolate (ROBINUL) 2 MG tablet, TAKE 1 TABLET BY MOUTH EVERY MORNING, Disp: 30 tablet, Rfl: 2 .  levocetirizine (XYZAL) 5 MG tablet, TAKE 1 TABLET BY MOUTH EVERY EVENING AS NEEDED, haven't started yet, Disp: , Rfl: 5 .  lidocaine (LIDODERM) 5 %, Place 1 patch onto the skin daily. Remove & Discard patch within 12 hours or as directed by MD, Disp: 30 patch, Rfl: 0 .  naproxen (NAPROSYN) 500 MG tablet, Take 500 mg by mouth daily as needed., Disp: , Rfl:  .   rizatriptan (MAXALT-MLT) 10 MG disintegrating tablet, Take 1 tablet (10 mg total) by mouth as needed for migraine. May repeat in 2 hours if needed, Disp: 10 tablet, Rfl: 3 .  sertraline (ZOLOFT) 100 MG tablet, Take 1 tablet (100 mg total) by mouth daily., Disp: 90 tablet, Rfl: 4 .  sucralfate (CARAFATE) 1 g tablet, Take 1 tablet (1 g total) by mouth 2 (two) times daily., Disp: 60 tablet, Rfl: 1 .  traMADol (ULTRAM) 50 MG tablet, Take 50 mg by mouth every 6 (six) hours as needed., Disp: , Rfl:  .  traMADol (ULTRAM) 50 MG tablet, Take 1-2 tablets (50-100 mg total) by mouth 3 (three) times daily as needed., Disp: 30 tablet, Rfl: 2 .  spironolactone (ALDACTONE) 50 MG tablet, Take 1 tablet (50 mg total) by mouth 2 (two) times daily., Disp: 180 tablet, Rfl: 2  Review of Systems:  Constitutional: Denies fever, chills, diaphoresis, appetite change and fatigue.  HEENT: Denies photophobia, eye pain, redness, hearing loss, ear pain, congestion, sore throat, rhinorrhea, sneezing, mouth sores, trouble swallowing, neck pain, neck stiffness and tinnitus.   Respiratory: Denies SOB, DOE, cough, chest tightness,  and wheezing.   Cardiovascular: Denies chest pain, palpitations and leg swelling.  Gastrointestinal: Denies nausea, vomiting, abdominal pain, diarrhea, constipation, blood in stool and abdominal distention.  Genitourinary: Denies dysuria, urgency, frequency, hematuria, flank pain and difficulty urinating.  Endocrine: Denies: hot or cold intolerance, sweats, changes in hair or nails, polyuria, polydipsia. Musculoskeletal: Denies myalgias, back pain, joint swelling, arthralgias and gait problem.  Skin: Denies pallor, rash and wound.  Neurological: Denies dizziness, seizures, syncope, weakness, light-headedness, numbness and headaches.  Hematological: Denies adenopathy. Easy bruising, personal or family bleeding history  Psychiatric/Behavioral: Denies suicidal ideation, mood changes, confusion, nervousness,  sleep disturbance and agitation    Physical Exam: Vitals:   10/09/18 0924  BP: 120/80  Pulse: (!) 56  Temp: 98.4 F (36.9 C)  TempSrc: Oral  SpO2: 99%  Weight: (!) 301 lb 12.8 oz (136.9 kg)    Body mass index is 50.22 kg/m.   Constitutional: NAD, calm, comfortable Eyes: PERRL, lids and conjunctivae normal ENMT: Mucous membranes are moist.  Musculoskeletal: see below pics of right lower leg:         I have observed her walking, she does not have a pronounced limp, is able to bend knee, altho extreme ranges of motion are painful.   Impression and Plan:  Acute pain of right knee Acute right ankle pain Accident due to mechanical fall without injury, initial encounter  -Ankle xrays were normal with podiatry 2 days ago. As she is able to bend  knee and walk with only minor difficulty, I think knee/tibial fractures are unlikely and pain is more likely due to severe edema and bruising. -Advised ice, ibuprofen. -She will RTC if no improvement in 10-14 days to consider imaging at that time.      Lelon Frohlich, MD [facility]

## 2018-10-13 ENCOUNTER — Encounter: Payer: Self-pay | Admitting: Internal Medicine

## 2018-10-14 MED FILL — CIPROFLOXACIN HCL 500 MG TA: 500 | 10 days supply | Qty: 20 | Fill #0

## 2018-10-16 ENCOUNTER — Ambulatory Visit: Payer: No Typology Code available for payment source | Admitting: Internal Medicine

## 2018-10-16 ENCOUNTER — Encounter (INDEPENDENT_AMBULATORY_CARE_PROVIDER_SITE_OTHER): Payer: Self-pay | Admitting: Orthopaedic Surgery

## 2018-10-16 ENCOUNTER — Ambulatory Visit: Payer: No Typology Code available for payment source | Admitting: Family Medicine

## 2018-10-23 ENCOUNTER — Encounter: Payer: Self-pay | Admitting: Family Medicine

## 2018-10-23 ENCOUNTER — Other Ambulatory Visit: Payer: Self-pay | Admitting: *Deleted

## 2018-10-23 DIAGNOSIS — M25571 Pain in right ankle and joints of right foot: Secondary | ICD-10-CM

## 2018-10-23 DIAGNOSIS — M25561 Pain in right knee: Secondary | ICD-10-CM

## 2018-10-26 ENCOUNTER — Telehealth (INDEPENDENT_AMBULATORY_CARE_PROVIDER_SITE_OTHER): Payer: Self-pay

## 2018-10-26 NOTE — Telephone Encounter (Signed)
Called patient and asked the screening questions.  Do you have now or have you had in the past 7 days a fever and/or chills? NO  Do you have now or have you had in the past 7 days a cough? NO  Do you have now or have you had in the last 7 days nausea, vomiting or abdominal pain? NO  Have you been exposed to anyone who has tested positive for COVID-19? NO  Have you or anyone who lives with you traveled within the last month? NO 

## 2018-10-27 ENCOUNTER — Ambulatory Visit (INDEPENDENT_AMBULATORY_CARE_PROVIDER_SITE_OTHER): Payer: No Typology Code available for payment source

## 2018-10-27 ENCOUNTER — Ambulatory Visit (INDEPENDENT_AMBULATORY_CARE_PROVIDER_SITE_OTHER): Payer: No Typology Code available for payment source | Admitting: Orthopaedic Surgery

## 2018-10-27 ENCOUNTER — Encounter (INDEPENDENT_AMBULATORY_CARE_PROVIDER_SITE_OTHER): Payer: Self-pay | Admitting: Orthopaedic Surgery

## 2018-10-27 DIAGNOSIS — M25561 Pain in right knee: Secondary | ICD-10-CM

## 2018-10-27 MED ORDER — BUPIVACAINE HCL 0.25 % IJ SOLN
2.0000 mL | INTRAMUSCULAR | Status: AC | PRN
  Administered 2018-10-27: 2 mL via INTRA_ARTICULAR

## 2018-10-27 MED ORDER — LIDOCAINE HCL 1 % IJ SOLN
2.0000 mL | INTRAMUSCULAR | Status: AC | PRN
  Administered 2018-10-27: 2 mL

## 2018-10-27 MED ORDER — METHYLPREDNISOLONE ACETATE 40 MG/ML IJ SUSP
40.0000 mg | INTRAMUSCULAR | Status: AC | PRN
  Administered 2018-10-27: 40 mg via INTRA_ARTICULAR

## 2018-10-27 NOTE — Progress Notes (Signed)
Office Visit Note   Patient: Alexa West           Date of Birth: 1970/12/18           MRN: [id number] Visit Date: 10/27/2018              Requested by: Martinique, Betty G, MD [address] [place], [place] [address] PCP: Martinique, Betty G, MD   Assessment & Plan: Visit Diagnoses:  1. Right knee pain, unspecified chronicity     Plan: Impression is right knee traumatic prepatellar bursitis and osteoarthritis flareup.  We injected the right knee joint with cortisone today.  We have discussed with the patient that this may or may not help.  I have also provided her with a sample of [place].  She does have Voltaren gel to use at home if needed.  Should she develop any fevers or chills or other signs of infection, she will call us.  Otherwise, follow-up with [place] as needed.  Follow-Up Instructions: Return if symptoms worsen or fail to improve.   Orders:  Orders Placed This Encounter  Procedures  . Large Joint Inj: R knee  . XR Knee Complete 4 Views Right   No orders of the defined types were placed in this encounter.     Procedures: Large Joint Inj: R knee on 10/27/2018 8:51 AM Indications: pain Details: 22 G needle, anterolateral approach Medications: 2 mL bupivacaine 0.25 %; 2 mL lidocaine 1 %; 40 mg methylPREDNISolone acetate 40 MG/ML      Clinical Data: No additional findings.   Subjective: Chief Complaint  Patient presents with  . Right Knee - Pain    HPI patient is a pleasant 48 year old female who presents our clinic today with right knee pain.  This began approximately 1 month ago when she sustained a mechanical fall onto the anterior aspect of the knee.  Since then she has had pain to the anterior and lateral aspects.  She notes a constant ache within the knee.  She does note tenderness to palpation to the anterior aspect.  Pain is worse with kneeling.  Nothing that seems to make this better to include over-the-counter medications.  No fevers or chills.   She does note a remote history of cortisone injection several years ago.  Review of Systems as detailed in HPI.  All others reviewed and are negative.   Objective: Vital Signs: There were no vitals taken for this visit.  Physical Exam well-developed well-nourished female no acute distress.  Alert and oriented x3.  Ortho Exam examination of her right knee reveals swelling to the prepatellar bursa.  She does have a small skin abrasion.  No erythema, drainage or signs of infection.  She does have tenderness to the lateral joint line.  She is stable to valgus and varus stress.  She is neurovascular intact distally.  Specialty Comments:  No specialty comments available.  Imaging: Xr Knee Complete 4 Views Right  Result Date: 10/27/2018 X-rays demonstrate moderate medial and patellofemoral degenerative changes.  No acute fracture.    PMFS History: Patient Active Problem List   Diagnosis Date Noted  . Right knee pain 10/27/2018  . Neck pain 05/13/2018  . Body mass index 50.0-59.9, adult ([place]) 05/13/2018  . PCOS (polycystic ovarian syndrome) 12/05/2017  . History of IBS 12/05/2017  . S/P cholecystectomy 12/05/2017  . Gastric ulcer with hemorrhage 12/05/2017  . Eosinophilic esophagitis 70/35/0093  . Morbid obesity ([place]) 10/13/2017  . Migraine headache without aura 10/13/2017  .  GI bleed 08/20/2016  . Transaminitis 08/20/2016  . Prediabetes 08/20/2016  . Lumbar pain 05/24/2014  . Impaired fasting glucose 09/18/2010  . Depression 03/04/2008  . Esophageal reflux 03/04/2008   Past Medical History:  Diagnosis Date  . 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 Anxiety   . Asthma   . Chicken pox   . Depression   . Genital warts   . GERD (gastroesophageal reflux disease)   . History of diverticulitis   . History of genital warts   . Hx: UTI (urinary tract infection)   . Migraines   . Urine incontinence     Family History  Problem Relation Age of Onset  . Arthritis Mother    . Depression Mother   . Hyperlipidemia Mother   . Miscarriages / [place] Mother   . Skin cancer Mother   . Early death Father   . Alcohol abuse Maternal Grandmother   . Arthritis Maternal Grandmother   . Depression Maternal Grandmother   . Diabetes Maternal Grandmother   . Hyperlipidemia Maternal Grandmother   . Hypertension Maternal Grandmother   . Kidney disease Maternal Grandmother   . Arthritis Maternal Grandfather   . Hypertension Maternal Grandfather   . Kidney disease Maternal Grandfather   . Heart attack Maternal Grandfather   . Leukemia Maternal Grandfather   . Bone cancer Maternal Grandfather   . Alcohol abuse Paternal Grandmother   . Cancer Paternal Grandmother   . Alcohol abuse Paternal Grandfather     Past Surgical History:  Procedure Laterality Date  . CERVICAL BIOPSY  W/ LOOP ELECTRODE EXCISION  1996  . CHOLECYSTECTOMY  1997  . HERNIA REPAIR  1976  . TONSILLECTOMY AND ADENOIDECTOMY  1996  . urethral stretching  1980   Social History   Occupational History  . Not on file  Tobacco Use  . Smoking status: Never Smoker  . Smokeless tobacco: Never Used  Substance and Sexual Activity  . Alcohol use: No    Frequency: Never  . Drug use: No  . Sexual activity: Yes    Partners: Male

## 2018-10-28 ENCOUNTER — Telehealth (INDEPENDENT_AMBULATORY_CARE_PROVIDER_SITE_OTHER): Payer: Self-pay | Admitting: Orthopaedic Surgery

## 2018-10-28 NOTE — Telephone Encounter (Signed)
Patient called asked for a call back concerning the [employer] papers. Patient said there is an error. Patient said it was suppose to be written for 8 hours a day /40 hours a week for 4 to 5 weeks. Patient asked if Ander Purpura can call her back? The number to contact patient is [phone]

## 2018-10-28 NOTE — Telephone Encounter (Signed)
Holding for you.  

## 2018-10-29 NOTE — Telephone Encounter (Signed)
FMLA papers are actually for her since she is caring for her husband who is the patient in need of care.  I called her back to discuss. Forms actually have her out of work until 05/04 not sure what confusion is. I have called her case mgr Neysa Hotter and LM for her to call me to discuss [phone] ext [phone]

## 2018-11-03 NOTE — Telephone Encounter (Signed)
I talked with patient advised still had not heard back from [place] with [employer].  I advised that if they still needed additional information to give me a call.

## 2018-11-13 MED FILL — DEXILANT DR 60 MG CAPSULE: 60 | 30 days supply | Qty: 30 | Fill #0

## 2018-11-20 ENCOUNTER — Ambulatory Visit
Admission: RE | Admit: 2018-11-20 | Discharge: 2018-11-20 | Disposition: A | Discharge status: Home / Self Care | Payer: No Typology Code available for payment source | Source: Ambulatory Visit | Attending: Gastroenterology | Admitting: Gastroenterology

## 2018-11-24 ENCOUNTER — Other Ambulatory Visit: Payer: Self-pay | Admitting: Family Medicine

## 2018-11-24 ENCOUNTER — Other Ambulatory Visit (INDEPENDENT_AMBULATORY_CARE_PROVIDER_SITE_OTHER): Payer: Self-pay | Admitting: Orthopedic Surgery

## 2018-11-24 MED FILL — RIZATRIPTAN 10 MG ODT: 10 | 30 days supply | Qty: 10 | Fill #0

## 2018-11-24 NOTE — Telephone Encounter (Signed)
This is a XU pt.  

## 2018-11-25 MED FILL — GABAPENTIN 300 MG CAPSULE: 300 | 30 days supply | Qty: 90 | Fill #0

## 2018-12-12 MED FILL — GLYCOPYRROLATE 2 MG TABS: 2 | 30 days supply | Qty: 30 | Fill #0

## 2018-12-14 ENCOUNTER — Other Ambulatory Visit: Payer: No Typology Code available for payment source

## 2018-12-29 ENCOUNTER — Encounter: Payer: Self-pay | Admitting: Family Medicine

## 2019-01-05 ENCOUNTER — Other Ambulatory Visit: Payer: Self-pay | Admitting: *Deleted

## 2019-01-05 ENCOUNTER — Encounter: Payer: Self-pay | Admitting: Internal Medicine

## 2019-01-05 MED ORDER — ALBUTEROL SULFATE HFA 108 (90 BASE) MCG/ACT IN AERS: 1.0000 | INHALATION_SPRAY | Freq: Four times a day (QID) | RESPIRATORY_TRACT | 1 refills | Status: DC | PRN

## 2019-01-05 MED FILL — ALBUTEROL SULFATE HFA 108 (: 108 (90 BAS | 25 days supply | Qty: 18 | Fill #0

## 2019-01-13 MED FILL — SERTRALINE HCL 100 MG TAB: 100 | 90 days supply | Qty: 90 | Fill #3

## 2019-01-13 MED FILL — DEXILANT DR 60 MG CAPSULE: 60 | 30 days supply | Qty: 30 | Fill #0

## 2019-01-18 ENCOUNTER — Other Ambulatory Visit: Payer: Self-pay | Admitting: Physician Assistant

## 2019-01-19 ENCOUNTER — Telehealth: Payer: Self-pay | Admitting: *Deleted

## 2019-01-19 NOTE — Telephone Encounter (Signed)
Per Dr [facility] office, patient has been seen multiple times at their office, last time being 08/17/18. Therefore, patient has transferred her GI care there.

## 2019-02-01 ENCOUNTER — Encounter: Payer: Self-pay | Admitting: Obstetrics & Gynecology

## 2019-02-01 ENCOUNTER — Other Ambulatory Visit: Payer: Self-pay

## 2019-02-01 ENCOUNTER — Ambulatory Visit (INDEPENDENT_AMBULATORY_CARE_PROVIDER_SITE_OTHER): Payer: No Typology Code available for payment source | Admitting: Obstetrics & Gynecology

## 2019-02-01 ENCOUNTER — Other Ambulatory Visit (HOSPITAL_COMMUNITY)
Admission: RE | Admit: 2019-02-01 | Discharge: 2019-02-01 | Disposition: A | Discharge status: Home / Self Care | Payer: No Typology Code available for payment source | Source: Ambulatory Visit | Attending: Obstetrics & Gynecology | Admitting: Obstetrics & Gynecology

## 2019-02-01 ENCOUNTER — Other Ambulatory Visit: Payer: Self-pay | Admitting: Physician Assistant

## 2019-02-01 VITALS — BP 128/72 | HR 74 | Temp 98.8°F | Resp 14 | Ht 66.0 in | Wt 312.0 lb

## 2019-02-01 DIAGNOSIS — Z1211 Encounter for screening for malignant neoplasm of colon: Secondary | ICD-10-CM | POA: Diagnosis not present

## 2019-02-01 DIAGNOSIS — B349 Viral infection, unspecified: Secondary | ICD-10-CM

## 2019-02-01 DIAGNOSIS — Z124 Encounter for screening for malignant neoplasm of cervix: Secondary | ICD-10-CM | POA: Diagnosis present

## 2019-02-01 DIAGNOSIS — Z01419 Encounter for gynecological examination (general) (routine) without abnormal findings: Secondary | ICD-10-CM | POA: Diagnosis not present

## 2019-02-01 DIAGNOSIS — L680 Hirsutism: Secondary | ICD-10-CM

## 2019-02-01 DIAGNOSIS — Z0000 Encounter for general adult medical examination without abnormal findings: Secondary | ICD-10-CM | POA: Diagnosis not present

## 2019-02-01 DIAGNOSIS — R3915 Urgency of urination: Secondary | ICD-10-CM

## 2019-02-01 DIAGNOSIS — Z0184 Encounter for antibody response examination: Secondary | ICD-10-CM

## 2019-02-01 LAB — POCT URINALYSIS DIPSTICK
Bilirubin, UA: NEGATIVE
Blood, UA: NEGATIVE
Glucose, UA: NEGATIVE
Ketones, UA: NEGATIVE
Leukocytes, UA: NEGATIVE
Nitrite, UA: NEGATIVE
Protein, UA: NEGATIVE
Urobilinogen, UA: NEGATIVE E.U./dL — AB
pH, UA: 6 (ref 5.0–8.0)

## 2019-02-01 MED ORDER — SERTRALINE HCL 100 MG PO TABS: 100.0000 mg | ORAL_TABLET | Freq: Every day | ORAL | 4 refills | Status: DC

## 2019-02-01 NOTE — Progress Notes (Signed)
48 y.o. G0P0000 Married White or Caucasian female here for annual exam.  She has urinary urgency but was unable to give a sample.  She reports her urine was cloudy as well.  This has been present over the past week.  She thinks it is better.    Denies vaginal bleeding.    Had significant URI in January.  She was sick for about six weeks.  She had SOB with walking which is atypical for her.  She wonders if this was Covid.  Would like antibody testing.       Sexually active: Yes.    The current method of family planning is abstinence.    Exercising: Yes.    The patient has a physically strenuous job, but has no regular exercise apart from work.  Smoker:  no  Health Maintenance: Pap: 03/09/18 normal Hr HPV Neg,  07/2015 Normal    History of abnormal Pap:  Yes 1998 MMG: 02/13/18 Density A  Bi-rads 1 neg Colonoscopy:  none BMD:   none TDaP:  Unsure of last one, declines today  Hep C testing: neg    Screening Labs: would like to talk to provider    reports that she has never smoked. She has never used smokeless tobacco. She reports that she does not drink alcohol or use drugs.  Past Medical History:  Diagnosis Date  . 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 Anxiety   . Asthma   . Chicken pox   . Depression   . Genital warts   . GERD (gastroesophageal reflux disease)   . History of diverticulitis   . History of genital warts   . Hx: UTI (urinary tract infection)   . Migraines   . Urine incontinence     Past Surgical History:  Procedure Laterality Date  . CERVICAL BIOPSY  W/ LOOP ELECTRODE EXCISION  1996  . CHOLECYSTECTOMY  1997  . HERNIA REPAIR  1976  . TONSILLECTOMY AND ADENOIDECTOMY  1996  . urethral stretching  1980    Current Outpatient Medications  Medication Sig Dispense Refill  . acetaminophen (TYLENOL) 500 MG tablet Take 500 mg by mouth every 8 (eight) hours as needed.    Marland Kitchen albuterol (VENTOLIN HFA) 108 (90 Base) MCG/ACT inhaler Inhale 1-2 puffs into the lungs  every 6 (six) hours as needed for wheezing or shortness of breath. 6.7 g 1  . AUVI-Q 0.3 MG/0.3ML SOAJ injection     . DEXILANT 60 MG capsule daily as needed.  12  . diazepam (VALIUM) 10 MG tablet TK 1 T PO TID PRF BACK SPASMS  0  . diclofenac sodium (VOLTAREN) 1 % GEL Apply 4 g topically 4 (four) times daily. 4 Tube 3  . diclofenac sodium (VOLTAREN) 1 % GEL Apply 2 g topically 4 (four) times daily. 100 g 0  . gabapentin (NEURONTIN) 300 MG capsule TAKE 1 CAPSULE BY MOUTH AT BEDTIME. 3 TIMES A DAY WHEN NECESSARY NEUROPATHY PAIN 90 capsule 3  . glycopyrrolate (ROBINUL) 2 MG tablet TAKE 1 TABLET BY MOUTH EVERY MORNING 30 tablet 2  . levocetirizine (XYZAL) 5 MG tablet TAKE 1 TABLET BY MOUTH EVERY EVENING AS NEEDED, haven't started yet  5  . lidocaine (LIDODERM) 5 % Place 1 patch onto the skin daily. Remove & Discard patch within 12 hours or as directed by MD 30 patch 0  . naproxen (NAPROSYN) 500 MG tablet Take 500 mg by mouth daily as needed.    . rizatriptan (  MAXALT-MLT) 10 MG disintegrating tablet TAKE 1 TABLET BY MOUTH AS NEEDED FOR MIGRAINE. MAY REPEAT IN 2 HOURS IF NEEDED 10 tablet 3  . sertraline (ZOLOFT) 100 MG tablet Take 1 tablet (100 mg total) by mouth daily. 90 tablet 4  . spironolactone (ALDACTONE) 50 MG tablet Take 1 tablet (50 mg total) by mouth 2 (two) times daily. 180 tablet 2  . sucralfate (CARAFATE) 1 g tablet Take 1 tablet (1 g total) by mouth 2 (two) times daily. 60 tablet 1  . traMADol (ULTRAM) 50 MG tablet Take 50 mg by mouth every 6 (six) hours as needed.    . traMADol (ULTRAM) 50 MG tablet Take 1-2 tablets (50-100 mg total) by mouth 3 (three) times daily as needed. 30 tablet 2   No current facility-administered medications for this visit.     Family History  Problem Relation Age of Onset  . Arthritis Mother   . Depression Mother   . Hyperlipidemia Mother   . Miscarriages / [place] Mother   . Skin cancer Mother   . Early death Father   . Alcohol abuse Maternal  Grandmother   . Arthritis Maternal Grandmother   . Depression Maternal Grandmother   . Diabetes Maternal Grandmother   . Hyperlipidemia Maternal Grandmother   . Hypertension Maternal Grandmother   . Kidney disease Maternal Grandmother   . Arthritis Maternal Grandfather   . Hypertension Maternal Grandfather   . Kidney disease Maternal Grandfather   . Heart attack Maternal Grandfather   . Leukemia Maternal Grandfather   . Bone cancer Maternal Grandfather   . Alcohol abuse Paternal Grandmother   . Cancer Paternal Grandmother   . Alcohol abuse Paternal Grandfather     Review of Systems  Genitourinary: Positive for urgency.    Exam:   Vitals:   02/01/19 0857  BP: 128/72  Pulse: 74  Resp: 14  Temp: 98.8 F (37.1 C)    General appearance: alert, cooperative and appears stated age Head: Normocephalic, without obvious abnormality, atraumatic Neck: no adenopathy, supple, symmetrical, trachea midline and thyroid normal to inspection and palpation Lungs: clear to auscultation bilaterally Breasts: normal appearance, no masses or tenderness Heart: regular rate and rhythm Abdomen: soft, non-tender; bowel sounds normal; no masses,  no organomegaly Extremities: extremities normal, atraumatic, no cyanosis or edema Skin: Skin color, texture, turgor normal. No rashes or lesions Lymph nodes: Cervical, supraclavicular, and axillary nodes normal. No abnormal inguinal nodes palpated Neurologic: Grossly normal   Pelvic: External genitalia:  no lesions              Urethra:  normal appearing urethra with no masses, tenderness or lesions              Bartholins and Skenes: normal                 Vagina: normal appearing vagina with normal color and discharge, no lesions              Cervix: no lesions              Pap taken: Yes.   Bimanual Exam:  Uterus:  normal size, contour, position, consistency, mobility, non-tender              Adnexa: normal adnexa and no mass, fullness, tenderness                Rectovaginal: Confirms               Anus:  normal sphincter tone, no  lesions  Chaperone was present for exam.  A:  Well Woman with normal exam OAB/SUI H/o depression Hirsutism esp on face with elevated free testosterone H/O PCOS  P:   Mammogram guidelines reviewed pap smear obtained today.  Will hold to see if will be covered as pt does desire to have this done today Testosterone, free and total, TSH, HBA1C and CMP Covid 19 IgG antibody testing today IFOB given to pt today Tdap recommended.  Declines today. Consider increasing spironolactone 50mg  bid to 100mg  bid.  Will await lab results. Sertraline 100mg  daily.  #90/4RF As urine dip was negative today, urine culture was not obtained. Return annually or prn

## 2019-02-03 LAB — CYTOLOGY - PAP
Adequacy: ABSENT
Diagnosis: NEGATIVE

## 2019-02-04 LAB — COMPREHENSIVE METABOLIC PANEL
ALT: 19 IU/L (ref 0–32)
AST: 21 IU/L (ref 0–40)
Albumin/Globulin Ratio: 1.3 (ref 1.2–2.2)
Albumin: 4 g/dL (ref 3.8–4.8)
Alkaline Phosphatase: 82 IU/L (ref 39–117)
BUN/Creatinine Ratio: 11 (ref 9–23)
BUN: 8 mg/dL (ref 6–24)
Bilirubin Total: 0.5 mg/dL (ref 0.0–1.2)
CO2: 21 mmol/L (ref 20–29)
Calcium: 8.9 mg/dL (ref 8.7–10.2)
Chloride: 100 mmol/L (ref 96–106)
Creatinine, Ser: 0.76 mg/dL (ref 0.57–1.00)
GFR calc Af Amer: 109 mL/min/{1.73_m2} (ref >59)
GFR calc non Af Amer: 94 mL/min/{1.73_m2} (ref >59)
Globulin, Total: 3 g/dL (ref 1.5–4.5)
Glucose: 91 mg/dL (ref 65–99)
Potassium: 4 mmol/L (ref 3.5–5.2)
Sodium: 136 mmol/L (ref 134–144)
Total Protein: 7 g/dL (ref 6.0–8.5)

## 2019-02-04 LAB — TESTT+TESTF+SHBG
Sex Hormone Binding: 82.8 nmol/L (ref 24.6–122.0)
Testosterone, Free: 4 pg/mL (ref 0.0–4.2)
Testosterone, Total, LC/MS: 23 ng/dL

## 2019-02-04 LAB — HEMOGLOBIN A1C
Est. average glucose Bld gHb Est-mCnc: 114 mg/dL
Hgb A1c MFr Bld: 5.6 % (ref 4.8–5.6)

## 2019-02-04 LAB — TSH: TSH: 3.17 u[IU]/mL (ref 0.450–4.500)

## 2019-02-09 ENCOUNTER — Telehealth: Payer: Self-pay | Admitting: *Deleted

## 2019-02-09 NOTE — Telephone Encounter (Signed)
-----   Message from Megan Salon, MD sent at 02/09/2019  9:18 AM EDT ----- Please let pt know her HbA1C was 5.6 which is right at prediabetes but ok to watch.  CMP was normal.  Testosterone levels are now normal so we do not need to make any changes with spironolactone.  TSH is normal.  Covid antibody testing is negative.  That means she is at risk for future infections.    Pap was negative.  02 recall.

## 2019-02-09 NOTE — Telephone Encounter (Signed)
LM for pt to call back.

## 2019-02-11 LAB — SPECIMEN STATUS REPORT

## 2019-02-11 LAB — FECAL OCCULT BLOOD, IMMUNOCHEMICAL: Fecal Occult Bld: POSITIVE — AB

## 2019-02-11 NOTE — Telephone Encounter (Signed)
LM for pt to call back.

## 2019-02-16 NOTE — Telephone Encounter (Signed)
Notes recorded by Polly Cobia, CMA on 02/16/2019 at 9:09 AM EDT  AEX scheduled 04/06/20.  Pt notified of results.  Patient states she has to check with her insurance for covered GI providers and will call back.  Emphasized that IFOB test is positive for blood and that it is important that she follows up. Patient verbalized understanding.   Dr. Lestine Box only  ------   Notes recorded by Megan Salon, MD on 02/15/2019 at 5:41 PM EDT  Please let pt know her stool test for blood was positive. She needs referral to GI for additional evaluation. I will make referral if she does not have any preference. Thanks.

## 2019-02-19 ENCOUNTER — Encounter: Payer: Self-pay | Admitting: Obstetrics & Gynecology

## 2019-02-19 LAB — SAR COV2 SEROLOGY (COVID19)AB(IGG),IA: SARS-CoV-2 Ab, IgG: NEGATIVE

## 2019-02-19 LAB — SPECIMEN STATUS REPORT

## 2019-02-22 ENCOUNTER — Other Ambulatory Visit: Payer: Self-pay | Admitting: Physician Assistant

## 2019-02-22 MED FILL — SPIRONOLACTONE 50 MG TABLET: 50 | 90 days supply | Qty: 180 | Fill #2

## 2019-02-22 MED FILL — DEXILANT DR 60 MG CAPSULE: 60 | 30 days supply | Qty: 30 | Fill #1

## 2019-03-02 MED FILL — GABAPENTIN 300 MG CAPSULE: 300 | 30 days supply | Qty: 90 | Fill #0

## 2019-03-03 MED FILL — SPIRONOLACTONE 50 MG TABLET: 50 | 90 days supply | Qty: 180 | Fill #2

## 2019-03-04 ENCOUNTER — Encounter

## 2019-03-04 ENCOUNTER — Encounter: Payer: Self-pay | Admitting: Podiatry

## 2019-03-04 ENCOUNTER — Other Ambulatory Visit: Payer: Self-pay

## 2019-03-04 ENCOUNTER — Ambulatory Visit (INDEPENDENT_AMBULATORY_CARE_PROVIDER_SITE_OTHER): Payer: No Typology Code available for payment source | Admitting: Podiatry

## 2019-03-04 VITALS — Temp 98.3°F

## 2019-03-04 DIAGNOSIS — M7752 Other enthesopathy of left foot: Secondary | ICD-10-CM | POA: Diagnosis not present

## 2019-03-04 DIAGNOSIS — L600 Ingrowing nail: Secondary | ICD-10-CM | POA: Diagnosis not present

## 2019-03-04 DIAGNOSIS — M7751 Other enthesopathy of right foot: Secondary | ICD-10-CM | POA: Diagnosis not present

## 2019-03-04 DIAGNOSIS — M779 Enthesopathy, unspecified: Secondary | ICD-10-CM

## 2019-03-04 NOTE — Progress Notes (Signed)
Subjective:   Patient ID: Alexa West, female   DOB: 48 y.o.   MRN: [id number]   HPI Patient presents stating that she is having a flareup in her ankles and she also wants to get her ingrown toenails fixed   ROS      Objective:  Physical Exam  Neurovascular status intact with patient found to have inflammation pain of the sinus tarsi bilateral with extensor tendinitis right and also was noted to have incurvated hallux nail borders hallux both feet medial lateral borders and third nail right is damaged     Assessment:  Inflammatory capsulitis bilateral sinus tarsitis with ingrown toenail deformity bilateral     Plan:  H&P conditions reviewed and today I went ahead and I injected the sinus tarsi bilateral 3 mg Kenalog 5 mg Xylocaine and I discussed ingrown toenail correction which she is going to do will be made a plan for when this will be performed and I educated her on the procedure

## 2019-03-09 MED FILL — DEXILANT DR 60 MG CAPSULE: 60 | 30 days supply | Qty: 30 | Fill #1

## 2019-03-10 ENCOUNTER — Ambulatory Visit: Payer: No Typology Code available for payment source | Admitting: Podiatry

## 2019-03-29 MED FILL — ALBUTEROL SULFATE HFA 108 (: 108 (90 BAS | 25 days supply | Qty: 18 | Fill #1

## 2019-04-01 ENCOUNTER — Other Ambulatory Visit: Payer: No Typology Code available for payment source | Admitting: Orthotics

## 2019-04-01 ENCOUNTER — Ambulatory Visit: Payer: No Typology Code available for payment source | Admitting: Podiatry

## 2019-04-06 ENCOUNTER — Ambulatory Visit: Payer: No Typology Code available for payment source | Admitting: Podiatry

## 2019-04-12 ENCOUNTER — Encounter: Payer: Self-pay | Admitting: Family Medicine

## 2019-04-13 ENCOUNTER — Other Ambulatory Visit: Payer: Self-pay | Admitting: Family Medicine

## 2019-04-13 ENCOUNTER — Encounter: Payer: Self-pay | Admitting: Family Medicine

## 2019-04-13 DIAGNOSIS — M25512 Pain in left shoulder: Secondary | ICD-10-CM

## 2019-04-13 DIAGNOSIS — M25571 Pain in right ankle and joints of right foot: Secondary | ICD-10-CM

## 2019-04-14 ENCOUNTER — Encounter: Payer: Self-pay | Admitting: Obstetrics & Gynecology

## 2019-04-16 ENCOUNTER — Other Ambulatory Visit: Payer: Self-pay

## 2019-04-16 ENCOUNTER — Ambulatory Visit (INDEPENDENT_AMBULATORY_CARE_PROVIDER_SITE_OTHER): Payer: No Typology Code available for payment source

## 2019-04-16 ENCOUNTER — Encounter: Payer: Self-pay | Admitting: Orthopaedic Surgery

## 2019-04-16 ENCOUNTER — Ambulatory Visit (INDEPENDENT_AMBULATORY_CARE_PROVIDER_SITE_OTHER): Payer: No Typology Code available for payment source | Admitting: Orthopaedic Surgery

## 2019-04-16 VITALS — Ht 66.5 in | Wt 307.0 lb

## 2019-04-16 DIAGNOSIS — G8929 Other chronic pain: Secondary | ICD-10-CM

## 2019-04-16 DIAGNOSIS — M5136 Other intervertebral disc degeneration, lumbar region: Secondary | ICD-10-CM

## 2019-04-16 DIAGNOSIS — M25512 Pain in left shoulder: Secondary | ICD-10-CM

## 2019-04-16 MED ORDER — METHYLPREDNISOLONE ACETATE 40 MG/ML IJ SUSP
40.0000 mg | INTRAMUSCULAR | Status: AC | PRN
  Administered 2019-04-16: 40 mg via INTRA_ARTICULAR

## 2019-04-16 MED ORDER — BUPIVACAINE HCL 0.25 % IJ SOLN
2.0000 mL | INTRAMUSCULAR | Status: AC | PRN
  Administered 2019-04-16: 09:00:00 2 mL via INTRA_ARTICULAR

## 2019-04-16 MED ORDER — LIDOCAINE HCL 2 % IJ SOLN
2.0000 mL | INTRAMUSCULAR | Status: AC | PRN
  Administered 2019-04-16: 09:00:00 2 mL

## 2019-04-16 NOTE — Progress Notes (Signed)
Office Visit Note   Patient: Alexa West           Date of Birth: 1970-09-02           MRN: [id number] Visit Date: 04/16/2019              Requested by: Martinique, Betty G, MD [address] [place],  [place] [address] PCP: Martinique, Betty G, MD   Assessment & Plan: Visit Diagnoses:  1. Chronic left shoulder pain   2. Degenerative disc disease, lumbar   3. Morbid obesity ([place])     Plan: Impression is left shoulder subacromial bursitis and chronic lower back pain.  We will inject the left shoulder with cortisone today.  We will start her in formal physical therapy for her shoulder, neck and lumbar spine.  She is to make all efforts at weight loss.  We may entertain referral for repeat ESI.  She would like to go to [facility] if possible.  She will call us and let us know. In the meantime, we will extend her current FMLA x 3 months.  Under this, she is able to leave work early if her back flares up. Total face to face encounter time was greater than 25 minutes and over half of this time was spent in counseling and/or coordination of care.  Follow-Up Instructions: Return if symptoms worsen or fail to improve.   Orders:  Orders Placed This Encounter  Procedures  . Large Joint Inj: L subacromial bursa  . XR Shoulder Left   No orders of the defined types were placed in this encounter.     Procedures: Large Joint Inj: L subacromial bursa on 04/16/2019 8:53 AM Indications: pain Details: 22 G needle Medications: 2 mL bupivacaine 0.25 %; 2 mL lidocaine 2 %; 40 mg methylPREDNISolone acetate 40 MG/ML Outcome: tolerated well, no immediate complications Patient was prepped and draped in the usual sterile fashion.       Clinical Data: No additional findings.   Subjective: Chief Complaint  Patient presents with  . Left Arm - Pain    HPI patient is a 48 year old female who comes in today with a multitude of problems.  The first 1 being her left arm.  Over the  past few months she has noticed increased pain to the deltoid and biceps.  No specific injury, but she does note that she has been helping her husband as he is recovering from bilateral quadriceps tendon repairs.  The pain is worse with external rotation and internal rotation of the shoulder.  She notes occasional numbness to the lateral aspect of her arm.  No previous shoulder pathology.  The other issue she brings up is numbness to the lateral right great toe.  This is been ongoing for the past few months.  She does have a history of lumbar pathology to include moderate bilateral L5-S1 neural foraminal stenosis and severe facet arthrosis L4-5 bilaterally.  She had an ESI by Dr. Ernestina Patches which gave her minimal improvement of symptoms.  She is unable to take NSAIDs due to GI ulcer.  She has not yet been to physical therapy.  No history of diabetes or peripheral neuropathy.  Review of Systems as detailed in HPI.  All others reviewed and are negative.   Objective: Vital Signs: Ht 5' 6.5" (1.689 m)   Wt (!) 307 lb (139.3 kg)   BMI 48.81 kg/m   Physical Exam well-developed and well-nourished female in no acute distress.  Alert and oriented x3.  Ortho Exam examination of her left shoulder reveals full active range of motion.  She does have pain with the extremes of external rotation and internal rotation.  Positive empty can and positive cross body adduction.  No focal weakness.  She is neurovascular intact distally.  Stable lumbar exam.  Specialty Comments:  No specialty comments available.  Imaging: Xr Shoulder Left  Result Date: 04/16/2019 Left shoulder x-rays reveal decreased joint space glenohumeral and AC joints    PMFS History: Patient Active Problem List   Diagnosis Date Noted  . Right knee pain 10/27/2018  . Neck pain 05/13/2018  . Body mass index 50.0-59.9, adult ([place]) 05/13/2018  . PCOS (polycystic ovarian syndrome) 12/05/2017  . History of IBS 12/05/2017  . S/P cholecystectomy  12/05/2017  . Gastric ulcer with hemorrhage 12/05/2017  . Eosinophilic esophagitis A999333  . Morbid obesity ([place]) 10/13/2017  . Migraine headache without aura 10/13/2017  . GI bleed 08/20/2016  . Transaminitis 08/20/2016  . Prediabetes 08/20/2016  . Lumbar pain 05/24/2014  . Impaired fasting glucose 09/18/2010  . Depression 03/04/2008  . Esophageal reflux 03/04/2008   Past Medical History:  Diagnosis Date  . 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 Anxiety   . Asthma   . Chicken pox   . Depression   . Genital warts   . GERD (gastroesophageal reflux disease)   . History of diverticulitis   . History of genital warts   . Hx: UTI (urinary tract infection)   . Migraines   . Urine incontinence     Family History  Problem Relation Age of Onset  . Arthritis Mother   . Depression Mother   . Hyperlipidemia Mother   . Miscarriages / [place] Mother   . Skin cancer Mother   . Early death Father   . Alcohol abuse Maternal Grandmother   . Arthritis Maternal Grandmother   . Depression Maternal Grandmother   . Diabetes Maternal Grandmother   . Hyperlipidemia Maternal Grandmother   . Hypertension Maternal Grandmother   . Kidney disease Maternal Grandmother   . Arthritis Maternal Grandfather   . Hypertension Maternal Grandfather   . Kidney disease Maternal Grandfather   . Heart attack Maternal Grandfather   . Leukemia Maternal Grandfather   . Bone cancer Maternal Grandfather   . Alcohol abuse Paternal Grandmother   . Cancer Paternal Grandmother   . Alcohol abuse Paternal Grandfather     Past Surgical History:  Procedure Laterality Date  . CERVICAL BIOPSY  W/ LOOP ELECTRODE EXCISION  1996  . CHOLECYSTECTOMY  1997  . HERNIA REPAIR  1976  . TONSILLECTOMY AND ADENOIDECTOMY  1996  . urethral stretching  1980   Social History   Occupational History  . Not on file  Tobacco Use  . Smoking status: Never Smoker  . Smokeless tobacco: Never Used  Substance and  Sexual Activity  . Alcohol use: No    Frequency: Never  . Drug use: No  . Sexual activity: Yes    Partners: Male

## 2019-05-04 ENCOUNTER — Other Ambulatory Visit: Payer: Self-pay | Admitting: Family Medicine

## 2019-05-05 MED FILL — ALBUTEROL SULFATE HFA 108 (: 108 (90 BAS | 25 days supply | Qty: 18 | Fill #0

## 2019-05-05 MED FILL — DEXILANT DR 60 MG CAPSULE: 60 | 30 days supply | Qty: 30 | Fill #0

## 2019-05-06 ENCOUNTER — Ambulatory Visit (INDEPENDENT_AMBULATORY_CARE_PROVIDER_SITE_OTHER): Payer: No Typology Code available for payment source

## 2019-05-06 ENCOUNTER — Ambulatory Visit (INDEPENDENT_AMBULATORY_CARE_PROVIDER_SITE_OTHER): Payer: No Typology Code available for payment source | Admitting: Orthopaedic Surgery

## 2019-05-06 ENCOUNTER — Encounter: Payer: Self-pay | Admitting: Orthopaedic Surgery

## 2019-05-06 DIAGNOSIS — G8929 Other chronic pain: Secondary | ICD-10-CM | POA: Diagnosis not present

## 2019-05-06 DIAGNOSIS — M25552 Pain in left hip: Secondary | ICD-10-CM

## 2019-05-06 DIAGNOSIS — M25571 Pain in right ankle and joints of right foot: Secondary | ICD-10-CM

## 2019-05-06 DIAGNOSIS — M25512 Pain in left shoulder: Secondary | ICD-10-CM | POA: Diagnosis not present

## 2019-05-06 MED ORDER — PREDNISONE 10 MG (21) PO TBPK: ORAL_TABLET | ORAL | 0 refills | Status: DC

## 2019-05-06 MED ORDER — DICLOFENAC SODIUM 1 % TD GEL: 2.0000 g | Freq: Four times a day (QID) | TRANSDERMAL | 1 refills | Status: DC

## 2019-05-06 MED FILL — DICLOFENAC SODIUM 1% GEL: 1 | 25 days supply | Qty: 200 | Fill #0

## 2019-05-06 MED FILL — predniSONE 10 MG TABS: 10 | 6 days supply | Qty: 21 | Fill #0

## 2019-05-06 NOTE — Progress Notes (Signed)
Office Visit Note   Patient: Alexa West           Date of Birth: 1970/08/21           MRN: [id number] Visit Date: 05/06/2019              Requested by: Martinique, Betty G, MD [address] [place],  [place] [address] PCP: Martinique, Betty G, MD   Assessment & Plan: Visit Diagnoses:  1. Pain in right ankle and joints of right foot   2. Pain in left hip     Plan: Impression is #1 right ankle peroneal tendinitis.  #2 left hip osteoarthritis and #3 chronic left lower back pain.  In regards to the right ankle, she thinks she has a cam walker at home and will check today.  If she does not have this she will let us know we will provide one for her.  I have also called in a Sterapred taper and Voltaren gel.  We will start her in physical therapy once her symptoms subside a little.  For the left hip, we will hold off on injection now to see if this is mostly from her back and to see if physical therapy will help.  She will let us know how she is doing when we see her in follow-up in a few weeks after her left shoulder MRI.  Total face to face encounter time was greater than 25 minutes and over half of this time was spent in counseling and/or coordination of care. Follow-Up Instructions: No follow-ups on file.   Orders:  Orders Placed This Encounter  Procedures  . XR Foot Complete Right  . XR HIP UNILAT W OR W/O PELVIS 2-3 VIEWS LEFT   Meds ordered this encounter  Medications  . predniSONE (STERAPRED UNI-PAK 21 TAB) 10 MG (21) TBPK tablet    Sig: Take as directed    Dispense:  21 tablet    Refill:  0  . diclofenac sodium (VOLTAREN) 1 % GEL    Sig: Apply 2 g topically 4 (four) times daily.    Dispense:  150 g    Refill:  1      Procedures: No procedures performed   Clinical Data: No additional findings.   Subjective: Chief Complaint  Patient presents with  . Right Foot - Pain    HPI patient is a 48 year old female who presents our clinic today with right foot  pain amongst other issues.  She has had years of pain to both feet with recently the right being worse.  She has been seen by Dr. Paulla Dolly in the past where she has had multiple cortisone injections to the ankles.  Her last injections were in August of this year without significant relief of symptoms.  The pain she has is primarily to the lateral ankle and into the lateral dorsum of the foot.  Worse when applying pressure to the foot as well as with plantarflexion of the ankle.  She has not been taking medications for this.  She denies any radicular symptoms to the right side.  She is also complaining of left thigh pain which goes into the knee.  She notes that she also has an occasional pain to the groin as well.  Pain is worse with ambulation.  No previous hip pathology.  She does have a history of chronic lower back pain for which she was seen by [place] about 3 weeks ago and sent to physical therapy.  She notes  that she lost her prescription and has not been to any physical therapy sessions.  Review of Systems as detailed in HPI.  All others reviewed and are negative.   Objective: Vital Signs: There were no vitals taken for this visit.  Physical Exam well-developed and well-nourished female in no acute distress.  Alert and oriented x3.  Ortho Exam examination of her right ankle reveals moderate tenderness along the peroneal tendon.  She has increased pain with resisted eversion and with plantar flexion.  Examination of her left hip reveals a positive logroll.  She has a positive straight leg raise.  No focal weakness.  She is neurovascularly intact distally.  Specialty Comments:  No specialty comments available.  Imaging: Xr Hip Unilat W Or W/o Pelvis 2-3 Views Left  Result Date: 05/06/2019 Mild joint space narrowing  Xr Foot Complete Right  Result Date: 05/06/2019 No acute or structural abnormalities    PMFS History: Patient Active Problem List   Diagnosis Date Noted  . Right knee pain  10/27/2018  . Neck pain 05/13/2018  . Body mass index 50.0-59.9, adult ([place]) 05/13/2018  . PCOS (polycystic ovarian syndrome) 12/05/2017  . History of IBS 12/05/2017  . S/P cholecystectomy 12/05/2017  . Gastric ulcer with hemorrhage 12/05/2017  . Eosinophilic esophagitis [id number]  . Morbid obesity ([place]) 10/13/2017  . Migraine headache without aura 10/13/2017  . GI bleed 08/20/2016  . Transaminitis 08/20/2016  . Prediabetes 08/20/2016  . Lumbar pain 05/24/2014  . Impaired fasting glucose 09/18/2010  . Depression 03/04/2008  . Esophageal reflux 03/04/2008   Past Medical History:  Diagnosis Date  . 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 Anxiety   . Asthma   . Chicken pox   . Depression   . Genital warts   . GERD (gastroesophageal reflux disease)   . History of diverticulitis   . History of genital warts   . Hx: UTI (urinary tract infection)   . Migraines   . Urine incontinence     Family History  Problem Relation Age of Onset  . Arthritis Mother   . Depression Mother   . Hyperlipidemia Mother   . Miscarriages / [place] Mother   . Skin cancer Mother   . Early death Father   . Alcohol abuse Maternal Grandmother   . Arthritis Maternal Grandmother   . Depression Maternal Grandmother   . Diabetes Maternal Grandmother   . Hyperlipidemia Maternal Grandmother   . Hypertension Maternal Grandmother   . Kidney disease Maternal Grandmother   . Arthritis Maternal Grandfather   . Hypertension Maternal Grandfather   . Kidney disease Maternal Grandfather   . Heart attack Maternal Grandfather   . Leukemia Maternal Grandfather   . Bone cancer Maternal Grandfather   . Alcohol abuse Paternal Grandmother   . Cancer Paternal Grandmother   . Alcohol abuse Paternal Grandfather     Past Surgical History:  Procedure Laterality Date  . CERVICAL BIOPSY  W/ LOOP ELECTRODE EXCISION  1996  . CHOLECYSTECTOMY  1997  . HERNIA REPAIR  1976  . TONSILLECTOMY AND ADENOIDECTOMY   1996  . urethral stretching  1980   Social History   Occupational History  . Not on file  Tobacco Use  . Smoking status: Never Smoker  . Smokeless tobacco: Never Used  Substance and Sexual Activity  . Alcohol use: No    Frequency: Never  . Drug use: No  . Sexual activity: Yes    Partners: Male

## 2019-05-07 ENCOUNTER — Encounter: Payer: Self-pay | Admitting: Orthopaedic Surgery

## 2019-05-07 ENCOUNTER — Telehealth: Payer: Self-pay | Admitting: Orthopaedic Surgery

## 2019-05-07 NOTE — Telephone Encounter (Signed)
Patient called stating that she thought she had a boot to wear on her right foot, but she does not, she wanting to come pick one up before we close at 5.  CB#[phone].  Thank you.

## 2019-05-07 NOTE — Telephone Encounter (Signed)
yes

## 2019-05-07 NOTE — Telephone Encounter (Signed)
Is it okay for patient to come and pick up a CAM boot?  See message below.  Please advise.  Thank you.

## 2019-05-10 ENCOUNTER — Ambulatory Visit: Payer: No Typology Code available for payment source | Admitting: Podiatry

## 2019-05-10 NOTE — Telephone Encounter (Signed)
Noted! Thank you

## 2019-05-18 ENCOUNTER — Encounter: Payer: Self-pay | Admitting: Orthopaedic Surgery

## 2019-05-20 ENCOUNTER — Ambulatory Visit: Payer: No Typology Code available for payment source

## 2019-05-20 ENCOUNTER — Other Ambulatory Visit: Payer: Self-pay | Admitting: Physician Assistant

## 2019-05-20 MED ORDER — TRAMADOL HCL 50 MG PO TABS: 50.0000 mg | ORAL_TABLET | Freq: Three times a day (TID) | ORAL | 0 refills | Status: DC | PRN

## 2019-05-20 MED FILL — traMADol HCL 50 MG TABS: 50 | 5 days supply | Qty: 30 | Fill #0

## 2019-05-29 ENCOUNTER — Other Ambulatory Visit: Payer: No Typology Code available for payment source

## 2019-06-08 ENCOUNTER — Encounter: Payer: Self-pay | Admitting: Orthopaedic Surgery

## 2019-06-09 ENCOUNTER — Other Ambulatory Visit: Payer: Self-pay

## 2019-06-09 DIAGNOSIS — M79671 Pain in right foot: Secondary | ICD-10-CM

## 2019-06-09 MED FILL — GABAPENTIN 300 MG CAPSULE: 300 | 30 days supply | Qty: 90 | Fill #1

## 2019-06-09 MED FILL — RIZATRIPTAN 10 MG ODT: 10 | 30 days supply | Qty: 9 | Fill #1

## 2019-06-09 MED FILL — DEXILANT DR 60 MG CAPSULE: 60 | 30 days supply | Qty: 30 | Fill #1

## 2019-06-09 MED FILL — SERTRALINE HCL 100 MG TAB: 100 | 90 days supply | Qty: 90 | Fill #0

## 2019-06-30 ENCOUNTER — Ambulatory Visit
Admission: RE | Admit: 2019-06-30 | Discharge: 2019-06-30 | Disposition: A | Discharge status: Home / Self Care | Payer: No Typology Code available for payment source | Source: Ambulatory Visit | Attending: Orthopaedic Surgery | Admitting: Orthopaedic Surgery

## 2019-06-30 ENCOUNTER — Inpatient Hospital Stay: Admission: RE | Admit: 2019-06-30 | Payer: No Typology Code available for payment source | Source: Ambulatory Visit

## 2019-06-30 ENCOUNTER — Other Ambulatory Visit: Payer: Self-pay | Admitting: Orthopaedic Surgery

## 2019-06-30 ENCOUNTER — Other Ambulatory Visit: Payer: Self-pay | Admitting: Physician Assistant

## 2019-06-30 ENCOUNTER — Other Ambulatory Visit: Payer: No Typology Code available for payment source

## 2019-06-30 DIAGNOSIS — M25512 Pain in left shoulder: Secondary | ICD-10-CM

## 2019-06-30 DIAGNOSIS — M79671 Pain in right foot: Secondary | ICD-10-CM

## 2019-06-30 DIAGNOSIS — G8929 Other chronic pain: Secondary | ICD-10-CM

## 2019-07-01 NOTE — Progress Notes (Signed)
Needs f/u appt 

## 2019-07-02 NOTE — Progress Notes (Signed)
With either of [place].  That's fine.  Thanks.

## 2019-07-08 ENCOUNTER — Ambulatory Visit (INDEPENDENT_AMBULATORY_CARE_PROVIDER_SITE_OTHER): Payer: No Typology Code available for payment source | Admitting: Orthopaedic Surgery

## 2019-07-08 ENCOUNTER — Other Ambulatory Visit: Payer: Self-pay

## 2019-07-08 ENCOUNTER — Encounter: Payer: Self-pay | Admitting: Orthopaedic Surgery

## 2019-07-08 DIAGNOSIS — M79671 Pain in right foot: Secondary | ICD-10-CM

## 2019-07-08 NOTE — Progress Notes (Signed)
Office Visit Note   Patient: Alexa West           Date of Birth: 1971-04-30           MRN: [id number] Visit Date: 07/08/2019              Requested by: Martinique, Betty G, MD [address] [place],  [place] [address] PCP: Martinique, Betty G, MD   Assessment & Plan: Visit Diagnoses:  1. Pain in right foot     Plan: MRI of the right foot is consistent with midfoot arthrosis and a high-grade sprain of the calcaneocuboid ligament.  She has a small ganglion cyst emanating from the talonavicular joint.  These findings were reviewed with the patient in detail.  Based on discussion patient agrees to try custom arch support.  She has tried Voltaren gel which does not help.  She will occasionally take p.o. NSAIDs but she does have a history of ulcers.  She also understands the importance of weight loss.  Questions encouraged and answered.  Follow-up as needed.  This note is not being shared with the patient for the following reason: To respect privacy (The patient or proxy has requested that the information not be shared).  Follow-Up Instructions: Return if symptoms worsen or fail to improve.   Orders:  No orders of the defined types were placed in this encounter.  No orders of the defined types were placed in this encounter.     Procedures: No procedures performed   Clinical Data: No additional findings.   Subjective: Chief Complaint  Patient presents with  . Right Foot - Pain    Barba is here for MRI review of the right foot.  She mainly complains of pain across the dorsal aspect of the midfoot that is worse with standing at the end of the day.   Review of Systems   Objective: Vital Signs: There were no vitals taken for this visit.  Physical Exam  Ortho Exam Right foot exam shows no significant tenderness to the midfoot.  There is no swelling.  There is no palpable masses. Specialty Comments:  No specialty comments available.  Imaging: No results found.    PMFS History: Patient Active Problem List   Diagnosis Date Noted  . Right knee pain 10/27/2018  . Neck pain 05/13/2018  . Body mass index 50.0-59.9, adult ([place]) 05/13/2018  . PCOS (polycystic ovarian syndrome) 12/05/2017  . History of IBS 12/05/2017  . S/P cholecystectomy 12/05/2017  . Gastric ulcer with hemorrhage 12/05/2017  . Eosinophilic esophagitis [id number]  . Morbid obesity ([place]) 10/13/2017  . Migraine headache without aura 10/13/2017  . GI bleed 08/20/2016  . Transaminitis 08/20/2016  . Prediabetes 08/20/2016  . Lumbar pain 05/24/2014  . Impaired fasting glucose 09/18/2010  . Depression 03/04/2008  . Esophageal reflux 03/04/2008   Past Medical History:  Diagnosis Date  . 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 Anxiety   . Asthma   . Chicken pox   . Depression   . Genital warts   . GERD (gastroesophageal reflux disease)   . History of diverticulitis   . History of genital warts   . Hx: UTI (urinary tract infection)   . Migraines   . Urine incontinence     Family History  Problem Relation Age of Onset  . Arthritis Mother   . Depression Mother   . Hyperlipidemia Mother   . Miscarriages / [place] Mother   . Skin cancer  Mother   . Early death Father   . Alcohol abuse Maternal Grandmother   . Arthritis Maternal Grandmother   . Depression Maternal Grandmother   . Diabetes Maternal Grandmother   . Hyperlipidemia Maternal Grandmother   . Hypertension Maternal Grandmother   . Kidney disease Maternal Grandmother   . Arthritis Maternal Grandfather   . Hypertension Maternal Grandfather   . Kidney disease Maternal Grandfather   . Heart attack Maternal Grandfather   . Leukemia Maternal Grandfather   . Bone cancer Maternal Grandfather   . Alcohol abuse Paternal Grandmother   . Cancer Paternal Grandmother   . Alcohol abuse Paternal Grandfather     Past Surgical History:  Procedure Laterality Date  . CERVICAL BIOPSY  W/ LOOP ELECTRODE EXCISION   1996  . CHOLECYSTECTOMY  1997  . HERNIA REPAIR  1976  . TONSILLECTOMY AND ADENOIDECTOMY  1996  . urethral stretching  1980   Social History   Occupational History  . Not on file  Tobacco Use  . Smoking status: Never Smoker  . Smokeless tobacco: Never Used  Substance and Sexual Activity  . Alcohol use: No  . Drug use: No  . Sexual activity: Yes    Partners: Male

## 2019-07-14 ENCOUNTER — Ambulatory Visit
Admission: RE | Admit: 2019-07-14 | Discharge: 2019-07-14 | Disposition: A | Discharge status: Home / Self Care | Payer: No Typology Code available for payment source | Source: Ambulatory Visit | Attending: Physician Assistant | Admitting: Physician Assistant

## 2019-07-14 ENCOUNTER — Other Ambulatory Visit: Payer: No Typology Code available for payment source

## 2019-07-14 DIAGNOSIS — G8929 Other chronic pain: Secondary | ICD-10-CM

## 2019-07-14 DIAGNOSIS — M25512 Pain in left shoulder: Secondary | ICD-10-CM

## 2019-07-15 NOTE — Progress Notes (Signed)
F/u appt ?

## 2019-08-02 ENCOUNTER — Other Ambulatory Visit: Payer: Self-pay | Admitting: Obstetrics & Gynecology

## 2019-08-02 MED FILL — GABAPENTIN 300 MG CAPSULE: 300 | 30 days supply | Qty: 90 | Fill #2

## 2019-08-02 MED FILL — ALBUTEROL SULFATE HFA 108 (: 108 (90 BAS | 25 days supply | Qty: 18 | Fill #1

## 2019-08-02 MED FILL — DEXILANT DR 60 MG CAPSULE: 60 | 30 days supply | Qty: 30 | Fill #2

## 2019-08-02 NOTE — Telephone Encounter (Signed)
Medication refill request: aldactone  Last AEX:  02/01/19  Next AEX: 04/06/20 Last MMG (if hormonal medication request): 02/16/18 Bi-rads 1 neg  Refill authorized: #[id number] with 1 RF please advise

## 2019-08-04 MED FILL — SPIRONOLACTONE 50 MG TABLET: 50 | 90 days supply | Qty: 180 | Fill #0

## 2019-08-08 ENCOUNTER — Telehealth: Payer: No Typology Code available for payment source | Admitting: Family

## 2019-08-08 DIAGNOSIS — Z20822 Contact with and (suspected) exposure to covid-19: Secondary | ICD-10-CM

## 2019-08-08 DIAGNOSIS — J4541 Moderate persistent asthma with (acute) exacerbation: Secondary | ICD-10-CM

## 2019-08-08 MED ORDER — ALBUTEROL SULFATE HFA 108 (90 BASE) MCG/ACT IN AERS: 2.0000 | INHALATION_SPRAY | Freq: Four times a day (QID) | RESPIRATORY_TRACT | 0 refills | Status: DC | PRN

## 2019-08-08 MED ORDER — BENZONATATE 100 MG PO CAPS: 100.0000 mg | ORAL_CAPSULE | Freq: Three times a day (TID) | ORAL | 0 refills | Status: DC | PRN

## 2019-08-08 MED ORDER — DEXAMETHASONE 6 MG PO TABS: 6.0000 mg | ORAL_TABLET | Freq: Every day | ORAL | 0 refills | Status: DC

## 2019-08-08 NOTE — Progress Notes (Signed)
E-Visit for Corona Virus Screening   Your current symptoms could be consistent with the coronavirus.  Many health care providers can now test patients at their office but not all are.  [place] has multiple testing sites. For information on our [place] testing locations and hours go to [email or web address]  We are enrolling you in our [place] for [place] . Daily you will receive a questionnaire within the [place] website. Our COVID 19 response team will be monitoring your responses daily.  Testing Information: The COVID-19 Community Testing sites will begin testing BY APPOINTMENT ONLY.  You can schedule online at [email or web address]  If you do not have access to a smart phone or computer you may call [phone] for an appointment.  Testing Locations: Appointment schedule is 8 am to 3:30 pm at all sites  [facility] indoors at [address] [place] [address] [facility]  indoors at [place]. [address] [place], [place] [address] [place] indoors at [address] [place] [address]  Additional testing sites in the Community:  . For [facility] Testing sites in [facility]  FaceUpdate.uy  . For Pop-up testing sites in [place]  BowlDirectory.co.uk  . For Testing sites with regular hours [email or web address]  . For [place] [email or web address]  . For [facility] [email or web address]  . For [facility] testing in [place] and [employer] [email or web address]  . For [facility] testing in [facility]   [email or web address].[email or web address]  For  more  information about community testing call [phone]   We are enrolling you in our [place] for [place] . Daily you will receive a questionnaire within the [place] website. Our COVID 19 response team will be monitoring your responses daily.  Please quarantine yourself while awaiting your test results. If you develop fever/cough/breathlessness, please stay home for 10 days with improving symptoms and until you have had 24 hours of no fever (without taking a fever reducer).  You should wear a mask or cloth face covering over your nose and mouth if you must be around other people or animals, including pets (even at home). Try to stay at least 6 feet away from other people. This will protect the people around you.  Please continue good preventive care measures, including:  frequent hand-washing, avoid touching your face, cover coughs/sneezes, stay out of crowds and keep a 6 foot distance from others.  COVID-19 is a respiratory illness with symptoms that are similar to the flu. Symptoms are typically mild to moderate, but there have been cases of severe illness and death due to the virus.   The following symptoms may appear 2-14 days after exposure: . Fever . Cough . Shortness of breath or difficulty breathing . Chills . Repeated shaking with chills . Muscle pain . Headache . Sore throat . New loss of taste or smell . Fatigue . Congestion or runny nose . Nausea or vomiting . Diarrhea  Go to the nearest hospital ED for assessment if fever/cough/breathlessness are severe or illness seems like a threat to life.  It is vitally important that if you feel that you have an infection such as this virus or any other virus that you stay home and away from places where you may spread it to others.  You should avoid contact with people age 49 and older.   You can use medication such as A prescription cough medication called Tessalon Perles 100 mg. You may take 1-2 capsules every 8 hours as  needed for cough and A prescription inhaler called Albuterol MDI 90 mcg /actuation 2 puffs every 4 hours as needed for shortness of breath, wheezing, cough and I have also sent in dexamethasone 6 mg daily that you will take for 5 days.   You may also take acetaminophen (Tylenol) as needed for fever.  Reduce your risk of any infection by using the same precautions used for avoiding the common cold or flu:  Marland Kitchen Wash your hands often with soap and warm water for at least 20 seconds.  If soap and water are not readily available, use an alcohol-based hand sanitizer with at least 60% alcohol.  . If coughing or sneezing, cover your mouth and nose by coughing or sneezing into the elbow areas of your shirt or coat, into a tissue or into your sleeve (not your hands). . Avoid shaking hands with others and consider head nods or verbal greetings only. . Avoid touching your eyes, nose, or mouth with unwashed hands.  . Avoid close contact with people who are sick. . Avoid places or events with large numbers of people in one location, like concerts or sporting events. . Carefully consider travel plans you have or are making. . If you are planning any travel outside or inside the [place], visit the CDC's Travelers' Health webpage for the latest health notices. . If you have some symptoms but not all symptoms, continue to monitor at home and seek medical attention if your symptoms worsen. . If you are having a medical emergency, call 911.  HOME CARE . Only take medications as instructed by your medical team. . Drink plenty of fluids and get plenty of rest. . A steam or ultrasonic humidifier can help if you have congestion.   GET HELP RIGHT AWAY IF YOU HAVE EMERGENCY WARNING SIGNS** FOR COVID-19. If you or someone is showing any of these signs seek emergency medical care immediately. Call 911 or proceed to your closest emergency facility if: . You develop worsening high fever. . Trouble breathing . Bluish lips or  face . Persistent pain or pressure in the chest . New confusion . Inability to wake or stay awake . You cough up blood. . Your symptoms become more severe  **This list is not all possible symptoms. Contact your medical provider for any symptoms that are sever or concerning to you.  MAKE SURE YOU   Understand these instructions.  Will watch your condition.  Will get help right away if you are not doing well or get worse.  Your e-visit answers were reviewed by a board certified advanced clinical practitioner to complete your personal care plan.  Depending on the condition, your plan could have included both over the counter or prescription medications.  If there is a problem please reply once you have received a response from your provider.  Your safety is important to [place].  If you have drug allergies check your prescription carefully.    You can use MyChart to ask questions about today's visit, request a non-urgent call back, or ask for a work or school excuse for 24 hours related to this e-Visit. If it has been greater than 24 hours you will need to follow up with your provider, or enter a new e-Visit to address those concerns. You will get an e-mail in the next two days asking about your experience.  I hope that your e-visit has been valuable and will speed your recovery. Thank you for using e-visits.   Approximately 5 minutes was  spent documenting and reviewing patient's chart.

## 2019-08-09 MED FILL — DEXAMETHASONE 2 MG TABLET: 2 | 5 days supply | Qty: 15 | Fill #0

## 2019-08-09 MED FILL — BENZONATATE 100 MG CAPS: 100 | 7 days supply | Qty: 20 | Fill #0

## 2019-09-03 ENCOUNTER — Ambulatory Visit (INDEPENDENT_AMBULATORY_CARE_PROVIDER_SITE_OTHER): Payer: No Typology Code available for payment source | Admitting: Family Medicine

## 2019-09-03 ENCOUNTER — Telehealth (INDEPENDENT_AMBULATORY_CARE_PROVIDER_SITE_OTHER): Payer: No Typology Code available for payment source | Admitting: Family Medicine

## 2019-09-03 VITALS — BP 120/80 | HR 74 | Temp 98.6°F | Resp 12 | Ht 67.0 in | Wt 306.4 lb

## 2019-09-03 DIAGNOSIS — J069 Acute upper respiratory infection, unspecified: Secondary | ICD-10-CM | POA: Diagnosis not present

## 2019-09-03 DIAGNOSIS — J45901 Unspecified asthma with (acute) exacerbation: Secondary | ICD-10-CM

## 2019-09-03 DIAGNOSIS — R0602 Shortness of breath: Secondary | ICD-10-CM | POA: Diagnosis not present

## 2019-09-03 DIAGNOSIS — J45909 Unspecified asthma, uncomplicated: Secondary | ICD-10-CM | POA: Diagnosis not present

## 2019-09-03 MED ORDER — PREDNISONE 20 MG PO TABS: ORAL_TABLET | ORAL | 0 refills | Status: AC

## 2019-09-03 MED ORDER — AZITHROMYCIN 250 MG PO TABS: ORAL_TABLET | ORAL | 0 refills | Status: AC

## 2019-09-03 NOTE — Patient Instructions (Addendum)
A few things to remember from today's visit:   Mild asthma with exacerbation, unspecified whether persistent - Plan: predniSONE (DELTASONE) 20 MG tablet  Shortness of breath  Albuterol inh 2 puff every 6 hours for a week then as needed for wheezing or shortness of breath.  If symptoms persist we will need chest X ray. Monitor for fever.  Please be sure medication list is accurate. If a new problem present, please set up appointment sooner than planned today.

## 2019-09-03 NOTE — Progress Notes (Signed)
Virtual Visit via Telephone Note  I connected with [place] on 09/03/19 at  2:00 PM EST by telephone and verified that I am speaking with the correct person using two identifiers.   I discussed the limitations, risks, security and privacy concerns of performing an evaluation and management service by telephone and the availability of in person appointments. I also discussed with the patient that there may be a patient responsible charge related to this service. The patient expressed understanding and agreed to proceed.  Location patient: home Location provider: work or home office Participants present for the call: patient, provider Patient did not have a visit in the prior 7 days to address this/these issue(s).   History of Present Illness: Pt is a 49 yo female with pmh sig for asthma, migraines, GERD, h/o GIB 2/2 gastric ulcers, PCOS seen by Dr. Martinique.  Pt with productive cough, wheezing, chest congestion, nausea, rhinorrhea started on Friday. Migraine and loose stools on Tuesday likely from starting menses.  Denies fever, sore throat, ear pain/pressure.  Using albuterol inhaler BID.  Took severe cough and flu medicine which has helped cough.  Endorses having similar symptoms at the end of January.  Was given an inhaler and prednisone.  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  Observations/Objective: Patient sounds cheerful and well on the phone. I do not appreciate any SOB. Speech and thought processing are grossly intact. Patient reported vitals:  Assessment and Plan: Upper respiratory tract infection, unspecified type  Uncomplicated asthma, unspecified asthma severity, unspecified whether persistent  -symptoms likely viral in nature, however pt would benefit from in person exam given h/o asthma and productive cough. -pt scheduled this evening in respiratory clinic. -continue albuterol inhaler prn  Follow Up Instructions: F/u with respiratory clinic this  evening.   I did not refer this patient for an OV in the next 24 hours for this/these issue(s).  I discussed the assessment and treatment plan with the patient. The patient was provided an opportunity to ask questions and all were answered. The patient agreed with the plan and demonstrated an understanding of the instructions.   The patient was advised to call back or seek an in-person evaluation if the symptoms worsen or if the condition fails to improve as anticipated.  I provided 9 minutes of non-face-to-face time during this encounter.   Billie Ruddy, MD

## 2019-09-03 NOTE — Progress Notes (Signed)
ACUTE VISIT   HPI:  Chief Complaint  Patient presents with  . Shortness of Breath    [name] is a 49 y.o. female with hx of IFG migraine,and allergies who is here today complaining of respiratory symptoms since first week of 07/2019,worse on 08/07/19 ans slowly getting better since then.  She completed treatment with Dexamethasone,which helped.  SOB and wheezing worse when lying down. She has not noted PND,CP, or edema. Negative for heartburn or burping.  She has not had fever, chill, or sore throat. Mild right earache,intermittent. Nasal congestion and rhinorrhea. No changes in smell or taste.  Productive cough, clear sputum, negative for hemoptysis. Hx of asthma. She has albuterol at home,last used 2 days ago.   Review of Systems  Constitutional: Positive for activity change and fatigue. Negative for appetite change.  HENT: Negative for ear discharge, mouth sores, sinus pressure and trouble swallowing.   Eyes: Negative for discharge and redness.  Cardiovascular: Negative for palpitations and leg swelling.  Gastrointestinal: Negative for abdominal pain, diarrhea and vomiting.       Nausea,attributed to menses.  Musculoskeletal: Negative for gait problem and myalgias.  Skin: Negative for rash.  Allergic/Immunologic: Positive for environmental allergies.  Neurological: Negative for weakness, numbness and headaches (No today but recently she had a migraine.).  Hematological: Negative for adenopathy.  Rest see pertinent positives and negatives per HPI.   Current Outpatient Medications on File Prior to Visit  Medication Sig Dispense Refill  . acetaminophen (TYLENOL) 500 MG tablet Take 500 mg by mouth every 8 (eight) hours as needed.    Marland Kitchen albuterol (VENTOLIN HFA) 108 (90 Base) MCG/ACT inhaler Inhale 2 puffs into the lungs every 6 (six) hours as needed for wheezing or shortness of breath. 8 g 0  . AUVI-Q 0.3 MG/0.3ML SOAJ injection     . benzonatate  (TESSALON PERLES) 100 MG capsule Take 1 capsule (100 mg total) by mouth 3 (three) times daily as needed. 20 capsule 0  . dexamethasone (DECADRON) 6 MG tablet Take 1 tablet (6 mg total) by mouth daily. 5 tablet 0  . DEXILANT 60 MG capsule daily as needed.  12  . diazepam (VALIUM) 10 MG tablet TK 1 T PO TID PRF BACK SPASMS  0  . diclofenac sodium (VOLTAREN) 1 % GEL Apply 2 g topically 4 (four) times daily. 150 g 1  . gabapentin (NEURONTIN) 300 MG capsule TAKE 1 CAPSULE BY MOUTH AT BEDTIME. 3 TIMES A DAY WHEN NECESSARY NEUROPATHY PAIN 90 capsule 3  . glycopyrrolate (ROBINUL) 2 MG tablet TAKE 1 TABLET BY MOUTH EVERY MORNING 30 tablet 2  . levocetirizine (XYZAL) 5 MG tablet TAKE 1 TABLET BY MOUTH EVERY EVENING AS NEEDED, haven't started yet  5  . lidocaine (LIDODERM) 5 % Place 1 patch onto the skin daily. Remove & Discard patch within 12 hours or as directed by MD 30 patch 0  . rizatriptan (MAXALT-MLT) 10 MG disintegrating tablet TAKE 1 TABLET BY MOUTH AS NEEDED FOR MIGRAINE. MAY REPEAT IN 2 HOURS IF NEEDED 10 tablet 3  . sertraline (ZOLOFT) 100 MG tablet Take 1 tablet (100 mg total) by mouth daily. 90 tablet 4  . spironolactone (ALDACTONE) 50 MG tablet TAKE 1 TABLET BY MOUTH 2 TIMES DAILY. 180 tablet 1  . traMADol (ULTRAM) 50 MG tablet Take 1-2 tablets (50-100 mg total) by mouth 3 (three) times daily as needed. 30 tablet 0   No current facility-administered medications on file prior to visit.  Past Medical History:  Diagnosis Date  . 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 Anxiety   . Asthma   . Chicken pox   . Depression   . Genital warts   . GERD (gastroesophageal reflux disease)   . History of diverticulitis   . History of genital warts   . Hx: UTI (urinary tract infection)   . Migraines   . Urine incontinence    Allergies  Allergen Reactions  . Cashew Nut Oil   . Milk-Related Compounds Diarrhea  . Wheat Bran   . Cephalosporins Hives and Rash  . Penicillins Hives and  Rash    Social History   Socioeconomic History  . Marital status: Married    Spouse name: Not on file  . Number of children: 0  . Years of education: Not on file  . Highest education level: Not on file  Occupational History  . Not on file  Tobacco Use  . Smoking status: Never Smoker  . Smokeless tobacco: Never Used  Substance and Sexual Activity  . Alcohol use: No  . Drug use: No  . Sexual activity: Yes    Partners: Male  Other Topics Concern  . Not on file  Social History Narrative  . Not on file   Social Determinants of Health   Financial Resource Strain:   . Difficulty of Paying Living Expenses: Not on file  Food Insecurity:   . Worried About Charity fundraiser in the Last Year: Not on file  . Ran Out of Food in the Last Year: Not on file  Transportation Needs:   . Lack of Transportation (Medical): Not on file  . Lack of Transportation (Non-Medical): Not on file  Physical Activity:   . Days of Exercise per Week: Not on file  . Minutes of Exercise per Session: Not on file  Stress:   . Feeling of Stress : Not on file  Social Connections:   . Frequency of Communication with Friends and Family: Not on file  . Frequency of Social Gatherings with Friends and Family: Not on file  . Attends Religious Services: Not on file  . Active Member of Clubs or Organizations: Not on file  . Attends Archivist Meetings: Not on file  . Marital Status: Not on file    Vitals:   09/03/19 1745  BP: 120/80  Pulse: 74  Resp: 12  Temp: 98.6 F (37 C)  SpO2: 97%   Body mass index is 47.99 kg/m.   Physical Exam  Nursing note and vitals reviewed. Constitutional: She is oriented to person, place, and time. She appears well-developed. She does not appear ill. No distress.  HENT:  Head: Normocephalic and atraumatic.  Right Ear: Tympanic membrane, external ear and ear canal normal.  Left Ear: Tympanic membrane, external ear and ear canal normal.  Mouth/Throat:  Oropharynx is clear and moist and mucous membranes are normal.  Postnasal drainage.  Eyes: Conjunctivae are normal.  Cardiovascular: Normal rate and regular rhythm.  No murmur heard. Respiratory: Effort normal. No stridor. No tachypnea. No respiratory distress. She has wheezes (Mild,bilateral.). She has no rhonchi. She has no rales.  Lymphadenopathy:       Head (right side): No submandibular adenopathy present.       Head (left side): No submandibular adenopathy present.    She has no cervical adenopathy.  Neurological: She is alert and oriented to person, place, and time. She has normal strength. Gait normal.  Skin: Skin is warm. No rash noted. No erythema.  Psychiatric: She has a normal mood and affect.  Well groomed, good eye contact.   ASSESSMENT AND PLAN:  Ms. Curtrina was seen today for shortness of breath.  Diagnoses and all orders for this visit:  Mild asthma with exacerbation, unspecified whether persistent Albuterol inh 2 puff every 6 hours for a week then as needed for wheezing or shortness of breath.  Because persistent symptoms she agrees with empiric abx treatment,side effects discussed. Prednisone taper started. Clearly instructed about warning signs.  -     predniSONE (DELTASONE) 20 MG tablet; 2 tabs x 3 days then 1 tab x 3 days, and 1/2 tab for 3 days.  Shortness of breath Not in respiratory distress.  I do not have X ray service today, we can arrange it if problem does not resolved in a few days or gets worse. Clearly instructed about warning signs. F/U in 2 weeks.  Other orders -     azithromycin (ZITHROMAX) 250 MG tablet; 2 tabs day one then 1 tab daily for 4 days.   Return in about 2 weeks (around 09/17/2019) for Virtual visit.Marland Kitchen    Jaken Fregia G. Martinique, MD  [facility]. [place] office.

## 2019-09-07 NOTE — Progress Notes (Signed)
Patient is scheduled for a telephone visit on  09/17/2019 at 2 PM

## 2019-09-09 MED FILL — DEXILANT DR 60 MG CAPSULE: 60 | 30 days supply | Qty: 30 | Fill #3

## 2019-09-09 MED FILL — SERTRALINE HCL 100 MG TAB: 100 | 90 days supply | Qty: 90 | Fill #1

## 2019-09-14 ENCOUNTER — Other Ambulatory Visit: Payer: Self-pay | Admitting: Family Medicine

## 2019-09-14 DIAGNOSIS — R05 Cough: Secondary | ICD-10-CM

## 2019-09-14 DIAGNOSIS — R059 Cough, unspecified: Secondary | ICD-10-CM

## 2019-09-17 ENCOUNTER — Telehealth (INDEPENDENT_AMBULATORY_CARE_PROVIDER_SITE_OTHER): Payer: No Typology Code available for payment source | Admitting: Family Medicine

## 2019-09-17 ENCOUNTER — Encounter: Payer: Self-pay | Admitting: Orthopaedic Surgery

## 2019-09-17 ENCOUNTER — Encounter: Payer: Self-pay | Admitting: Family Medicine

## 2019-09-17 VITALS — Ht 67.0 in

## 2019-09-17 DIAGNOSIS — R05 Cough: Secondary | ICD-10-CM | POA: Diagnosis not present

## 2019-09-17 DIAGNOSIS — J4541 Moderate persistent asthma with (acute) exacerbation: Secondary | ICD-10-CM | POA: Diagnosis not present

## 2019-09-17 DIAGNOSIS — R059 Cough, unspecified: Secondary | ICD-10-CM

## 2019-09-17 MED ORDER — AEROCHAMBER PLUS MISC: 1 refills | Status: AC

## 2019-09-17 MED ORDER — FLOVENT HFA 110 MCG/ACT IN AERO: 2.0000 | INHALATION_SPRAY | Freq: Two times a day (BID) | RESPIRATORY_TRACT | 1 refills | Status: DC

## 2019-09-17 MED ORDER — BENZONATATE 100 MG PO CAPS: 200.0000 mg | ORAL_CAPSULE | Freq: Two times a day (BID) | ORAL | 0 refills | Status: AC | PRN

## 2019-09-17 MED FILL — FLOVENT HFA 110 MCG INHALER: 110 | 30 days supply | Qty: 12 | Fill #0

## 2019-09-17 MED FILL — BENZONATATE 100 MG CAPS: 100 | 7 days supply | Qty: 28 | Fill #0

## 2019-09-17 MED FILL — AEROCHAMBER: 1 days supply | Qty: 1 | Fill #0

## 2019-09-17 NOTE — Progress Notes (Signed)
Virtual Visit via Telephone Note  I connected with Alexa West on 09/17/19 at  2:00 PM EST by telephone and verified that I am speaking with the correct person using two identifiers.   I discussed the limitations, risks, security and privacy concerns of performing an evaluation and management service by telephone and the availability of in person appointments. I also discussed with the patient that there may be a patient responsible charge related to this service. The patient expressed understanding and agreed to proceed.  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  History of Present Illness: Alexa West is a 49 yo female with hx of of GERD, eosinophilic esophagitis, and asthma who is concerned about persistent cough after recovering from COVID-19 infection. Seen on 09/03/19 at the respiratory clinic because SOB,wheezing,and cough. She had already completed oral steroids, recommended another round of Prednisone taper + oral antibiotic. In general symptoms have improved. Still having productive cough with greenish/creamy sputum, negative for hemoptysis. Negative for fever, chills, sore throat, CP,SOB, abdominal pain, N/V, or changes in bowel habits. Intermittent episodes of wheezing,also improved.   Placed on albuterol inhaler 2 puffs every 4-6 hours as needed.  Observations/Objective: Patient sounds cheerful and well on the phone. I do not appreciate any SOB.Non productive cough x 1, no wheezing. Speech and thought processing are grossly intact. Patient reported vitals:Ht 5\' 7"  (1.702 m)   LMP 08/30/2019   BMI 47.99 kg/m    Assessment and Plan:  1. Moderate persistent asthma with acute exacerbation Symptoms still not well controlled. Recommend adding Flovent 110 mcg 2 puffs twice daily to use for about 2 months. Some side effect  discussed, recommend swishing after each use. Continue albuterol 2 puffs every 4-6 hours as needed. She voices understanding and agrees with plan.  - fluticasone (FLOVENT HFA) 110 MCG/ACT inhaler; Inhale 2 puffs into the lungs 2 (two) times daily.  Dispense: 1 Inhaler; Refill: 1 - Spacer/Aero-Holding Chambers (AEROCHAMBER PLUS) inhaler; Use as instructed to use with inahaler.  Dispense: 1 each; Refill: 1  2. Cough Explained that it is not unusual to have persistent cough after respiratory infection, most so when there is a history of allergies. CXR order was placed early this week, recommended having it done.  - benzonatate (TESSALON) 100 MG capsule; Take 2 capsules (200 mg total) by mouth 2 (two) times daily as needed for up to 7 days.  Dispense: 28 capsule; Refill: 0  Follow Up Instructions:  Return if symptoms worsen or fail to improve, for As far as symptoms continue improving within the next 2 months..  I did not refer this patient for an OV in the next 24 hours for this/these issue(s).  I discussed the assessment and treatment plan with the patient. The patient was provided an opportunity to ask questions and all were answered. The patient agreed with the plan and demonstrated an understanding of the instructions.   I provided 7-8 minutes of non-face-to-face time during this encounter.   Hiroshi Krummel Martinique, MD

## 2019-09-20 NOTE — Telephone Encounter (Signed)
I'm comfortable with doing it for another 6 months.

## 2019-10-01 ENCOUNTER — Ambulatory Visit (INDEPENDENT_AMBULATORY_CARE_PROVIDER_SITE_OTHER)
Admission: RE | Admit: 2019-10-01 | Discharge: 2019-10-01 | Disposition: A | Discharge status: Home / Self Care | Payer: No Typology Code available for payment source | Source: Ambulatory Visit | Attending: Family Medicine | Admitting: Family Medicine

## 2019-10-01 ENCOUNTER — Other Ambulatory Visit: Payer: Self-pay

## 2019-10-01 DIAGNOSIS — R05 Cough: Secondary | ICD-10-CM

## 2019-10-01 DIAGNOSIS — R059 Cough, unspecified: Secondary | ICD-10-CM

## 2019-10-03 ENCOUNTER — Encounter: Payer: Self-pay | Admitting: Family Medicine

## 2019-10-06 ENCOUNTER — Other Ambulatory Visit (INDEPENDENT_AMBULATORY_CARE_PROVIDER_SITE_OTHER): Payer: Self-pay | Admitting: Physician Assistant

## 2019-10-06 MED FILL — ALBUTEROL SULFATE HFA 108 (: 108 (90 BAS | 25 days supply | Qty: 9 | Fill #0

## 2019-10-06 MED FILL — RIZATRIPTAN 10 MG ODT: 10 | 30 days supply | Qty: 9 | Fill #2

## 2019-10-11 ENCOUNTER — Encounter: Payer: Self-pay | Admitting: Orthopaedic Surgery

## 2019-10-13 ENCOUNTER — Other Ambulatory Visit: Payer: Self-pay | Admitting: Physician Assistant

## 2019-10-13 MED ORDER — TRAMADOL HCL 50 MG PO TABS: 50.0000 mg | ORAL_TABLET | Freq: Two times a day (BID) | ORAL | 0 refills | Status: DC | PRN

## 2019-10-13 MED FILL — traMADol HCL 50 MG TABS: 50 | 30 days supply | Qty: 60 | Fill #0

## 2019-10-13 NOTE — Telephone Encounter (Signed)
Is she taking nsaids?  I can do a rx for tramadol, but if she needs something long term, she needs to contact pcp, or we can refer to pain management

## 2019-10-13 NOTE — Telephone Encounter (Signed)
If gabapentin is helping, she can certainly continue.  I will call in a rx for tramadol.  Just let me know if she would like me to refer to pain management

## 2019-10-15 ENCOUNTER — Other Ambulatory Visit: Payer: Self-pay

## 2019-10-15 DIAGNOSIS — M79671 Pain in right foot: Secondary | ICD-10-CM

## 2019-10-15 DIAGNOSIS — M25512 Pain in left shoulder: Secondary | ICD-10-CM

## 2019-10-15 DIAGNOSIS — G8929 Other chronic pain: Secondary | ICD-10-CM

## 2019-10-15 MED ORDER — GABAPENTIN 300 MG PO CAPS: ORAL_CAPSULE | ORAL | 0 refills | Status: DC

## 2019-10-15 NOTE — Telephone Encounter (Signed)
Ok to do

## 2019-10-19 MED FILL — GABAPENTIN 300 MG CAPSULE: 300 | 30 days supply | Qty: 90 | Fill #0

## 2019-11-16 ENCOUNTER — Encounter: Payer: Self-pay | Admitting: Physical Medicine and Rehabilitation

## 2019-12-09 ENCOUNTER — Ambulatory Visit: Payer: No Typology Code available for payment source | Admitting: Physical Medicine and Rehabilitation

## 2019-12-24 ENCOUNTER — Other Ambulatory Visit: Payer: Self-pay | Admitting: Family

## 2019-12-24 DIAGNOSIS — Z20822 Contact with and (suspected) exposure to covid-19: Secondary | ICD-10-CM

## 2019-12-24 DIAGNOSIS — J4541 Moderate persistent asthma with (acute) exacerbation: Secondary | ICD-10-CM

## 2019-12-24 MED FILL — SPIRONOLACTONE 50 MG TABLET: 50 | 90 days supply | Qty: 180 | Fill #1

## 2019-12-24 MED FILL — SERTRALINE HCL 100 MG TAB: 100 | 90 days supply | Qty: 90 | Fill #2

## 2019-12-31 ENCOUNTER — Encounter: Payer: Self-pay | Admitting: Physical Medicine & Rehabilitation

## 2019-12-31 ENCOUNTER — Other Ambulatory Visit: Payer: Self-pay

## 2019-12-31 ENCOUNTER — Encounter
Payer: No Typology Code available for payment source | Attending: Physical Medicine & Rehabilitation | Admitting: Physical Medicine & Rehabilitation

## 2019-12-31 VITALS — BP 124/79 | HR 61 | Temp 97.2°F | Ht 66.0 in | Wt 302.6 lb

## 2019-12-31 DIAGNOSIS — M797 Fibromyalgia: Secondary | ICD-10-CM | POA: Insufficient documentation

## 2019-12-31 MED ORDER — GABAPENTIN 100 MG PO CAPS: ORAL_CAPSULE | ORAL | 1 refills | Status: DC

## 2019-12-31 MED FILL — GABAPENTIN 100 MG CAPSULE: 100 | 30 days supply | Qty: 90 | Fill #0

## 2019-12-31 NOTE — Progress Notes (Signed)
Subjective:    Patient ID: Alexa West, female    DOB: [birth year], 49 y.o.   MRN: [id number]  HPI  CC:  Left shoulder pain and Right foot pain   Left shoulder pain occurs with laying down and with overhead activity Unable to lay on left side  No history of shoulder injections on the left shoulder.  Went once to PT but had financial issues that limited visits.  RIght foot pain top of lateral foot intermittent , made worse with first few steps after prolonged sitting Started last year no trauma.  No change in she wear.  Now has orthopedic inserts that help a little.  Saw podiatry used to get shots in ankles but these were not very effective Occ numbess and tingling which is distinct from usual pain symptoms.  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  The patient also complains of alternating diarrhea and constipation.  Has heard of irritable bowel syndrome but not been diagnosed.  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 In addition patient feels like she has frequent urination and sometimes burning.  She has not been treated for urinary tract infections recently but has had them in the past.  She has had no fever or chills.  Pain Inventory Average Pain 6 Pain Right Now 6 My pain is sharp, dull and aching  In the last 24 hours, has pain interfered with the following? General activity 2 Relation with others 0 Enjoyment of life 5 What TIME of day is your pain at its worst? morning daytime Sleep (in general) Good  Pain is worse with: walking, bending, sitting, inactivity and standing Pain improves with: rest, heat/ice, therapy/exercise, medication, TENS and injections Relief from Meds: 5  Mobility walk without assistance walk with assistance ability to climb steps?   yes do you drive?  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  no  Physicians involved in your care Any changes since last visit?  no Orthopedist Xu   Family History  Problem Relation Age of Onset  . Arthritis Mother   . Depression Mother   . Hyperlipidemia Mother   . Miscarriages / [place] Mother   . Skin cancer Mother   . Early death Father   . Alcohol abuse Maternal Grandmother   . Arthritis Maternal Grandmother   . Depression Maternal Grandmother   . Diabetes Maternal Grandmother   . Hyperlipidemia Maternal Grandmother   . Hypertension Maternal Grandmother   . Kidney disease Maternal Grandmother   . Arthritis Maternal Grandfather   . Hypertension Maternal Grandfather   . Kidney disease Maternal Grandfather   . Heart attack Maternal Grandfather   . Leukemia Maternal Grandfather   . Bone cancer Maternal Grandfather   . Alcohol abuse Paternal Grandmother   . Cancer Paternal Grandmother   . Alcohol abuse Paternal Grandfather    Social History   Socioeconomic History  . Marital status: Married    Spouse name: Not on file  . Number of children: 0  . Years of education: Not on file  . Highest education level: Not  on file  Occupational History  . Not on file  Tobacco Use  . Smoking status: Never Smoker  . Smokeless tobacco: Never Used  Substance and Sexual Activity  . Alcohol use: No  . Drug use: No  . Sexual activity: Yes    Partners: Male  Other Topics Concern  . Not on file  Social History Narrative  . Not on file   Social Determinants of Health   Financial Resource Strain:   . Difficulty of Paying Living Expenses:   Food Insecurity:   . Worried About Charity fundraiser in the Last Year:   . Arboriculturist in the Last Year:   Transportation Needs:   . Consulting civil engineer (Medical):   Marland Kitchen Lack of Transportation (Non-Medical):   Physical Activity:   . Days of Exercise per Week:   . Minutes of Exercise per Session:   Stress:   . Feeling of Stress :   Social Connections:   . Frequency of Communication with Friends and Family:   . Frequency of Social Gatherings with Friends and Family:   . Attends Religious Services:   . Active Member of Clubs or Organizations:   . Attends Archivist Meetings:   Marland Kitchen Marital Status:    Past Surgical History:  Procedure Laterality Date  . CERVICAL BIOPSY  W/ LOOP ELECTRODE EXCISION  1996  . CHOLECYSTECTOMY  1997  . HERNIA REPAIR  1976  . TONSILLECTOMY AND ADENOIDECTOMY  1996  . urethral stretching  1980   Past Medical History:  Diagnosis Date  . 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 Anxiety   . Asthma   . Chicken pox   . Depression   . Genital warts   . GERD (gastroesophageal reflux disease)   . History of diverticulitis   . History of genital warts   . Hx: UTI (urinary tract infection)   . Migraines   . Urine incontinence    BP 124/79   Pulse 61   Temp (!) 97.2 F (36.2 C)   Ht 5\' 6"  (1.676 m)   Wt (!) 302 lb 9.6 oz (137.3 kg)   SpO2 94%   BMI 48.84 kg/m   Opioid Risk Score:   Fall Risk Score:  `1  Depression screen PHQ 2/9  Depression screen [facility] 2/9 12/31/2019 01/02/2018  Decreased Interest 2 0  Down, Depressed, Hopeless 1 1  PHQ - 2 Score 3 1  Altered sleeping 1 -  Tired, decreased energy 3 -  Change in appetite 3 -  Feeling bad or failure about yourself  3 -  Trouble concentrating 1 -  Moving slowly or fidgety/restless 2 -  Suicidal thoughts 0 -  PHQ-9 Score 16 -  Difficult doing work/chores Somewhat difficult -   Review of Systems  Constitutional: Negative.   HENT: Negative.   Eyes: Negative.   Respiratory: Positive for wheezing.   Cardiovascular: Negative.   Gastrointestinal: Positive for constipation, diarrhea, nausea and vomiting.  Endocrine:  Negative.   Genitourinary: Negative.   Musculoskeletal: Negative.   Skin: Negative.   Allergic/Immunologic: Negative.   Neurological: Negative.   Hematological: Negative.   Psychiatric/Behavioral: Positive for dysphoric mood.  All other systems reviewed and are negative.      Objective:   Physical Exam Vitals and nursing note reviewed.  Constitutional:      Appearance: She is obese.  HENT:     Head: Normocephalic and atraumatic.  Eyes:  Extraocular Movements: Extraocular movements intact.     Conjunctiva/sclera: Conjunctivae normal.     Pupils: Pupils are equal, round, and reactive to light.  Neck:     Comments: Reduced cervical range of motion 50% rotation to the right and to the left.  No pain with cervical flexion or extension. Cardiovascular:     Rate and Rhythm: Normal rate and regular rhythm.     Heart sounds: Normal heart sounds. No murmur.  Pulmonary:     Effort: Pulmonary effort is normal. No respiratory distress.     Breath sounds: Normal breath sounds. No stridor.  Abdominal:     General: Abdomen is flat. Bowel sounds are normal. There is no distension.     Palpations: Abdomen is soft.  Musculoskeletal:        General: No tenderness.     Cervical back: No rigidity or tenderness.     Comments: No pain to palpation along the trapezius upper back thoracic paraspinal or hip area.  There is tenderness palpation at the lumbosacral junction bilaterally. Full range of motion in bilateral upper and lower limbs Negative drop arm test There is pain with end range external and internal rotation of both shoulders There is no tenderness palpation or swelling noted in right and left foot or ankle area Right ankle no pain with range of motion except with ankle dorsiflexion No tenderness over the plantar arch, no tenderness over the calcaneus or Achilles insertion, no tenderness over the medic tarsals, no metatarsal swelling or skin lesions noted. Left foot and ankle also  normal  Skin:    General: Skin is warm and dry.  Neurological:     Mental Status: She is alert and oriented to person, place, and time.     Sensory: Sensation is intact.     Motor: Motor function is intact.     Coordination: Coordination is intact.     Gait: Gait is intact.     Comments: Strength is 5/5 bilateral deltoid bicep tricep grip hip flexor knee extensor ankle dorsiflexor Patient is able to toe walk and heel walk. Normal gait without assistive device  Sensation normal to light touch bilateral upper and lower limbs            Assessment & Plan:  #1.  Chronic widespread pain worse in the left shoulder.  She does have a very small rotator cuff tear as well as moderate acromioclavicular joint osteoarthritis.  We discussed that her pain is out of proportion to the underlying pathology.  She has not had any physical therapy and has complained that co-pays are excessive for her. The patient has alternating diarrhea constipation suggestive of irritable bowel, food allergies may have a role.  GI consult may be helpful Patient does have frequent urination and symptoms of burning but no recent history of urinary tract infections.  this also may suggest interstitial cystitis GU consult may be helpful 1 the patient does not have classic tender points, this constellation symptoms suggest fibromyalgia syndrome.  I do think treatment with other duloxetine, gabapentin or pregabalin may be helpful.  She has tried gabapentin which was helpful but caused excessive sedation.  Will reduce dose to 100 mg 3 times daily We discussed duloxetine however she is on a fairly high dose of sertraline which would need to be weaned and discontinued prior to starting duloxetine. We also discussed the importance of exercise.  Given her recent lack of exercise other than work-related activities that do think aquatic therapy may be  the best way to go.  She has a pool at home but it requires repairs.  We discussed  [facility] for [facility] I will see patient back in 6 weeks May consider acromioclavicular joint injection or subacromial bursa injection at that time 2.  Right foot pain x-rays unremarkable does not have any focal neuro deficits just radiculopathy.  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  She did not respond to epidural injection performed by Dr. Ernestina Patches in 2019.  Has history of pes planus, seen by podiatry in the past partial relief with foot orthoses.  MRI of the foot demonstrating midfoot osteoarthritic changes.  This may be a biomechanical issue related to her pes planus , obesity, amplified by fibromyalgia syndrome  Breaking fibromyalgia syndrome.  Reassess after trial of gabapentin

## 2019-12-31 NOTE — Patient Instructions (Addendum)
Myofascial Pain Syndrome and Fibromyalgia Myofascial pain syndrome and fibromyalgia are both pain disorders. This pain may be felt mainly in your muscles.  Myofascial pain syndrome: ? Always has tender points in the muscle that will cause pain when pressed (trigger points). The pain may come and go. ? Usually affects your neck, upper back, and shoulder areas. The pain often radiates into your arms and hands.  Fibromyalgia: ? Has muscle pains and tenderness that come and go. ? Is often associated with fatigue and sleep problems. ? Has trigger points. ? Tends to be long-lasting (chronic), but is not life-threatening. Fibromyalgia and myofascial pain syndrome are not the same. However, they often occur together. If you have both conditions, each can make the other worse. Both are common and can cause enough pain and fatigue to make day-to-day activities difficult. Both can be hard to diagnose because their symptoms are common in many other conditions. What are the causes? The exact causes of these conditions are not known. What increases the risk? You are more likely to develop this condition if:  You have a family history of the condition.  You have certain triggers, such as: ? Spine disorders. ? An injury (trauma) or other physical stressors. ? Being under a lot of stress. ? Medical conditions such as osteoarthritis, rheumatoid arthritis, or lupus. What are the signs or symptoms? Fibromyalgia The main symptom of fibromyalgia is widespread pain and tenderness in your muscles. Pain is sometimes described as stabbing, shooting, or burning. You may also have:  Tingling or numbness.  Sleep problems and fatigue.  Problems with attention and concentration (fibro fog). Other symptoms may include:  Bowel and bladder problems.  Headaches.  Visual problems.  Problems with odors and noises.  Depression or mood changes.  Painful menstrual periods (dysmenorrhea).  Dry skin or  eyes. These symptoms can vary over time. Myofascial pain syndrome Symptoms of myofascial pain syndrome include:  Tight, ropy bands of muscle.  Uncomfortable sensations in muscle areas. These may include aching, cramping, burning, numbness, tingling, and weakness.  Difficulty moving certain parts of the body freely (poor range of motion). How is this diagnosed? This condition may be diagnosed by your symptoms and medical history. You will also have a physical exam. In general:  Fibromyalgia is diagnosed if you have pain, fatigue, and other symptoms for more than 3 months, and symptoms cannot be explained by another condition.  Myofascial pain syndrome is diagnosed if you have trigger points in your muscles, and those trigger points are tender and cause pain elsewhere in your body (referred pain). How is this treated? Treatment for these conditions depends on the type that you have.  For fibromyalgia: ? Pain medicines, such as NSAIDs. ? Medicines for treating depression. ? Medicines for treating seizures. ? Medicines that relax the muscles.  For myofascial pain: ? Pain medicines, such as NSAIDs. ? Cooling and stretching of muscles. ? Trigger point injections. ? Sound wave (ultrasound) treatments to stimulate muscles. Treating these conditions often requires a team of health care providers. These may include:  Your primary care provider.  Physical therapist.  Complementary health care providers, such as massage therapists or acupuncturists.  Psychiatrist for cognitive behavioral therapy. Follow these instructions at home: Medicines  Take over-the-counter and prescription medicines only as told by your health care provider.  Do not drive or use heavy machinery while taking prescription pain medicine.  If you are taking prescription pain medicine, take actions to prevent or treat constipation. Your health care  provider may recommend that you: ? Drink enough fluid to keep  your urine pale yellow. ? Eat foods that are high in fiber, such as fresh fruits and vegetables, whole grains, and beans. ? Limit foods that are high in fat and processed sugars, such as fried or sweet foods. ? Take an over-the-counter or prescription medicine for constipation. Lifestyle   Exercise as directed by your health care provider or physical therapist.  Practice relaxation techniques to control your stress. You may want to try: ? Biofeedback. ? Visual imagery. ? Hypnosis. ? Muscle relaxation. ? Yoga. ? Meditation.  Maintain a healthy lifestyle. This includes eating a healthy diet and getting enough sleep.  Do not use any products that contain nicotine or tobacco, such as cigarettes and e-cigarettes. If you need help quitting, ask your health care provider. General instructions  Talk to your health care provider about complementary treatments, such as acupuncture or massage.  Consider joining a support group with others who are diagnosed with this condition.  Do not do activities that stress or strain your muscles. This includes repetitive motions and heavy lifting.  Keep all follow-up visits as told by your health care provider. This is important. Where to find more information  National Fibromyalgia Association: www.fmaware.[place]: www.arthritis.org  American Chronic Pain Association: www.theacpa.org Contact a health care provider if:  You have new symptoms.  Your symptoms get worse or your pain is severe.  You have side effects from your medicines.  You have trouble sleeping.  Your condition is causing depression or anxiety. Summary  Myofascial pain syndrome and fibromyalgia are pain disorders.  Myofascial pain syndrome has tender points in the muscle that will cause pain when pressed (trigger points). Fibromyalgia also has muscle pains and tenderness that come and go, but this condition is often associated with fatigue and sleep  disturbances.  Fibromyalgia and myofascial pain syndrome are not the same but often occur together, causing pain and fatigue that make day-to-day activities difficult.  Treatment for fibromyalgia includes taking medicines to relax the muscles and medicines for pain, depression, or seizures. Treatment for myofascial pain syndrome includes taking medicines for pain, cooling and stretching of muscles, and injecting medicines into trigger points.  Follow your health care provider's instructions for taking medicines and maintaining a healthy lifestyle. This information is not intended to replace advice given to you by your health care provider. Make sure you discuss any questions you have with your health care provider. Document Revised: 11/06/2018 Document Reviewed: 07/30/2017 Elsevier Patient Education  2020 [employer].  Please call [employer] for pool info, water aerobics

## 2020-01-11 ENCOUNTER — Telehealth: Payer: Self-pay | Admitting: *Deleted

## 2020-01-11 NOTE — Telephone Encounter (Signed)
Left message to call Sharee Pimple, RN at [facility].   Last AEX 02/01/19

## 2020-01-11 NOTE — Telephone Encounter (Signed)
Message  Appointment Request From: Sherril Croon    With Provider: Megan Salon, MD [facility]]    Preferred Date Range: Any    Preferred Times: Monday Afternoon, Tuesday Afternoon, Wednesday Afternoon, Thursday Afternoon, Friday Afternoon    Reason for visit: Office Visit    Comments:  I am having severe pain in my unginual/ ovary area on the right side. Some lower back/ hip/ side pain the pain has been present off and on for over a week.   I work everyday this week and get off work at 3:30pm.

## 2020-01-13 NOTE — Telephone Encounter (Signed)
Left message to call Makaio Mach, RN at [facility] [phone].   

## 2020-01-18 NOTE — Telephone Encounter (Signed)
Patient has not returned call x2.   Routing to Dr. Lestine Box.   Encounter closed.

## 2020-02-10 ENCOUNTER — Encounter: Payer: No Typology Code available for payment source | Admitting: Physical Medicine & Rehabilitation

## 2020-02-15 ENCOUNTER — Other Ambulatory Visit: Payer: Self-pay

## 2020-02-15 ENCOUNTER — Encounter
Payer: No Typology Code available for payment source | Attending: Physical Medicine & Rehabilitation | Admitting: Physical Medicine & Rehabilitation

## 2020-02-15 ENCOUNTER — Encounter: Payer: Self-pay | Admitting: Physical Medicine & Rehabilitation

## 2020-02-15 VITALS — BP 114/76 | HR 72 | Temp 98.8°F | Ht 66.0 in | Wt 314.2 lb

## 2020-02-15 DIAGNOSIS — M1612 Unilateral primary osteoarthritis, left hip: Secondary | ICD-10-CM | POA: Diagnosis not present

## 2020-02-15 DIAGNOSIS — M797 Fibromyalgia: Secondary | ICD-10-CM | POA: Diagnosis not present

## 2020-02-15 DIAGNOSIS — Z6841 Body Mass Index (BMI) 40.0 and over, adult: Secondary | ICD-10-CM

## 2020-02-15 DIAGNOSIS — Z8719 Personal history of other diseases of the digestive system: Secondary | ICD-10-CM

## 2020-02-15 MED ORDER — GABAPENTIN 100 MG PO CAPS: ORAL_CAPSULE | ORAL | 1 refills | Status: DC

## 2020-02-15 NOTE — Progress Notes (Addendum)
Subjective:    Patient ID: Alexa West, female    DOB: 02-24-1971, 49 y.o.   MRN: [id number]  HPI Right hip , left hip/groin and knee pain  Overall body aches have improved since resuming gabapentin.  She is tolerating the 100 mg dose better than the 300 mg dose.  She had to stop 300 mg dose because it was causing excessive sedation.  Discused bowel and bladder symptoms, the patient has alternating diarrhea and constipation.  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  We discussed elevated BMI.  She has considered weight reduction surgery in the past but put this on hold after her husband suffered a motor vehicle accident last year Patient has complaints of left hip pain that occurs when she is trying to get dressed.  She feels this is mainly in the groin area.  It limits her bending forward No falls no new injuries Pain Inventory Average Pain 6 Pain Right Now 6 My pain is intermittent, sharp and aching  In the last 24 hours, has pain interfered with the following? General activity 2 Relation with others 2 Enjoyment of life 2 What TIME of day is your pain at its worst? Morning, daytime, evening & night. Sleep (in general) Fair  Pain is worse with: walking, bending, inactivity, standing and some activites Pain improves with: rest, medication and TENS Relief from Meds: 4  Mobility how many minutes can you walk? No problem with walking. ability to climb steps?  yes do you drive?  yes  Function employed # of hrs/week 40 hour or more per week. 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 I need assistance with the following:  dressing and Husband sometimes has to help me get socks off & on. Do you have any goals in this area?   yes  Neuro/Psych bladder control problems bowel control problems weakness  Prior Studies Any changes since last visit?  no  Physicians involved in your care Any changes since last visit?  no   Family History  Problem Relation Age of Onset  . Arthritis Mother   . Depression Mother   . Hyperlipidemia Mother   . Miscarriages / [place] Mother   . Skin cancer Mother   . Early death Father   . Alcohol abuse Maternal Grandmother   . Arthritis Maternal Grandmother   . Depression Maternal Grandmother   . Diabetes Maternal Grandmother   . Hyperlipidemia Maternal Grandmother   . Hypertension Maternal Grandmother   . Kidney disease Maternal Grandmother   . Arthritis Maternal Grandfather   . Hypertension Maternal Grandfather   . Kidney disease Maternal Grandfather   . Heart attack Maternal Grandfather   . Leukemia Maternal Grandfather   . Bone cancer Maternal Grandfather   . Alcohol abuse Paternal Grandmother   . Cancer Paternal Grandmother   . Alcohol abuse Paternal Grandfather    Social History   Socioeconomic History  . Marital status: Married    Spouse name: Not on file  . Number of children: 0  . Years of education: Not on file  . Highest education level: Not on file  Occupational History  . Not on file  Tobacco Use  . Smoking status: Never Smoker  .  Smokeless tobacco: Never Used  Vaping Use  . Vaping Use: Never used  Substance and Sexual Activity  . Alcohol use: No  . Drug use: No  . Sexual activity: Yes    Partners: Male  Other Topics Concern  . Not on file  Social History Narrative  . Not on file   Social Determinants of Health   Financial Resource Strain:   . Difficulty of Paying Living Expenses:   Food Insecurity:   . Worried About Charity fundraiser in the Last Year:   . Arboriculturist in the Last Year:   Transportation Needs:   . Film/video editor (Medical):   Marland Kitchen Lack of Transportation (Non-Medical):   Physical Activity:   . Days of  Exercise per Week:   . Minutes of Exercise per Session:   Stress:   . Feeling of Stress :   Social Connections:   . Frequency of Communication with Friends and Family:   . Frequency of Social Gatherings with Friends and Family:   . Attends Religious Services:   . Active Member of Clubs or Organizations:   . Attends Archivist Meetings:   Marland Kitchen Marital Status:    Past Surgical History:  Procedure Laterality Date  . CERVICAL BIOPSY  W/ LOOP ELECTRODE EXCISION  1996  . CHOLECYSTECTOMY  1997  . HERNIA REPAIR  1976  . TONSILLECTOMY AND ADENOIDECTOMY  1996  . urethral stretching  1980   Past Medical History:  Diagnosis Date  . 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 Anxiety   . Asthma   . Chicken pox   . Depression   . Genital warts   . GERD (gastroesophageal reflux disease)   . History of diverticulitis   . History of genital warts   . Hx: UTI (urinary tract infection)   . Migraines   . Urine incontinence    BP 114/76   Pulse 72   Temp 98.8 F (37.1 C)   Ht 5\' 6"  (1.676 m)   Wt (!) 314 lb 3.2 oz (142.5 kg)   LMP 01/11/2020   SpO2 96%   BMI 50.71 kg/m   Opioid Risk Score:   Fall Risk Score:  `1  Depression screen PHQ 2/9  Depression screen [facility] 2/9 12/31/2019 01/02/2018  Decreased Interest 2 0  Down, Depressed, Hopeless 1 1  PHQ - 2 Score 3 1  Altered sleeping 1 -  Tired, decreased energy 3 -  Change in appetite 3 -  Feeling bad or failure about yourself  3 -  Trouble concentrating 1 -  Moving slowly or fidgety/restless 2 -  Suicidal thoughts 0 -  PHQ-9 Score 16 -  Difficult doing work/chores Somewhat difficult -   Review of Systems  Constitutional: Negative.   HENT: Negative.   Eyes: Negative.   Respiratory: Negative.   Cardiovascular: Negative.   Gastrointestinal: Positive for constipation.  Endocrine: Negative.   Genitourinary:       Urine leakage  Musculoskeletal: Positive for back pain.       Pain both knees Hip pain  Skin: Negative.    Allergic/Immunologic: Negative.   Neurological: Positive for weakness.       Weak knees  Hematological: Negative.   Psychiatric/Behavioral: Negative.        Objective:   Physical Exam Vitals and nursing note reviewed.  Constitutional:      Appearance: She is obese. She is not ill-appearing.  HENT:  Head: Normocephalic and atraumatic.  Eyes:     General:        Right eye: No discharge.        Left eye: No discharge.     Extraocular Movements: Extraocular movements intact.     Conjunctiva/sclera: Conjunctivae normal.     Pupils: Pupils are equal, round, and reactive to light.  Musculoskeletal:     Cervical back: Normal range of motion.     Right lower leg: No edema.     Left lower leg: No edema.     Comments: Pain with left hip internal rotation no pain with right hip internal rotation no pain to palpation over the right knee joint line the right patellar tendon or the right quadriceps tendon no pain in the hamstring area or in the popliteal space.  Skin:    General: Skin is warm and dry.  Neurological:     General: No focal deficit present.     Mental Status: She is alert and oriented to person, place, and time.     Comments: Motor strength is 5/5 bilateral deltoid by stress of grip hip flexion extensor ankle dorsiflexion. Gait without evidence of toe drag or knee instability  Psychiatric:        Mood and Affect: Mood normal.        Behavior: Behavior normal.        Thought Content: Thought content normal.        Judgment: Judgment normal.           Assessment & Plan:  #1.  Fibromyalgia syndrome she feels like her widespread body pain has improved since being on gabapentin 100 mg 3 times daily.  She does indicate that morning of the worst time so we will increase her gabapentin to 200 mg every morning 100 mg every afternoon and 100 mg nightly  2.  Left hip pain, Osteoarthritis we reviewed x-rays demonstrating osteophytes as well as some mild joint space narrowing.   We discussed that since this is limiting some of her activities of daily living, may benefit from intra-articular injection under fluoroscopic guidance.  We will schedule. 3.  Right knee pain no evidence of effusion no evidence of tenderness to palpation.  This could potentially be radiating from the right hip area.   4.  Right hip pain no pain with range of motion.  Does not appear to be causing functional issues this may be also related to osteoarthritis although she has more lateral hip pain rather than groin pain.

## 2020-02-17 ENCOUNTER — Other Ambulatory Visit: Payer: Self-pay | Admitting: Family Medicine

## 2020-02-17 MED FILL — GABAPENTIN 100 MG CAPSULE: 100 | 30 days supply | Qty: 120 | Fill #0

## 2020-02-18 ENCOUNTER — Other Ambulatory Visit: Payer: Self-pay | Admitting: Family Medicine

## 2020-02-18 MED FILL — RIZATRIPTAN 10 MG ODT: 10 | 30 days supply | Qty: 9 | Fill #0

## 2020-03-03 ENCOUNTER — Encounter: Payer: Self-pay | Admitting: Family Medicine

## 2020-03-03 ENCOUNTER — Telehealth (INDEPENDENT_AMBULATORY_CARE_PROVIDER_SITE_OTHER): Payer: No Typology Code available for payment source | Admitting: Family Medicine

## 2020-03-03 ENCOUNTER — Other Ambulatory Visit: Payer: Self-pay

## 2020-03-03 VITALS — BP 134/74 | Ht 66.0 in

## 2020-03-03 DIAGNOSIS — R32 Unspecified urinary incontinence: Secondary | ICD-10-CM

## 2020-03-03 DIAGNOSIS — M1612 Unilateral primary osteoarthritis, left hip: Secondary | ICD-10-CM | POA: Insufficient documentation

## 2020-03-03 DIAGNOSIS — R109 Unspecified abdominal pain: Secondary | ICD-10-CM | POA: Diagnosis not present

## 2020-03-03 DIAGNOSIS — K219 Gastro-esophageal reflux disease without esophagitis: Secondary | ICD-10-CM

## 2020-03-03 DIAGNOSIS — R42 Dizziness and giddiness: Secondary | ICD-10-CM

## 2020-03-03 DIAGNOSIS — N926 Irregular menstruation, unspecified: Secondary | ICD-10-CM

## 2020-03-03 DIAGNOSIS — R7301 Impaired fasting glucose: Secondary | ICD-10-CM

## 2020-03-03 DIAGNOSIS — M79673 Pain in unspecified foot: Secondary | ICD-10-CM

## 2020-03-03 MED ORDER — DEXILANT 60 MG PO CPDR: 60.0000 mg | DELAYED_RELEASE_CAPSULE | Freq: Every day | ORAL | 1 refills | Status: DC | PRN

## 2020-03-03 NOTE — Progress Notes (Signed)
Virtual Visit via Video Note   I connected with Alexa West on 03/03/20 by a video enabled telemedicine application and verified that I am speaking with the correct person using two identifiers.  Location patient: home Location provider:work office Persons participating in the virtual visit: patient, provider  I discussed the limitations of evaluation and management by telemedicine and the availability of in person appointments. The patient expressed understanding and agreed to proceed.  Chief Complaint  Patient presents with  . Dizziness    HPI: Alexa West is a 49 yo female with hx of anxiety,asthma,GERD,chronic back pain,IFG,and IBS c/o 10 days of dizziness, not spinning sensation,usually in the morning when she gets up. Lasts about 10-15 min. No daily and stable.  No associated visual changes,sore throat,headache,earache,hearing changes,CP,palpitations,vomiting,or focal deficit. + Nasal congestion. Nausea for about 2 weeks. + Fatigue. "Gasy" diffuse abdominal pain that sometimes improves with defecation. + Back pain, different to her usual. Negative for saddle anesthesia.  She was taking Ibuprofen after dental work.  Today a "little" diarrhea. She has not noted blood in stool or melena.  + Heartburn. Not sure about exacerbating or alleviating factors.  She has not seen her GI in a while. She was on Dexialant 25 mg,which helped.  Hx of urinary frequency. Urine incontinence for the past 2 days. No dysuria or hematuria. Negative for fever,chills,changes in appetite,abnormal wt loss,or skin rash.  LMP 03/02/20, states that it was not normal. Negative for vaginal discharge. No sexually active.  She has gained some wt, no more than 5 Lb.Would like HgA1C added to next labs.  Lab Results  Component Value Date   HGBA1C 5.6 02/01/2019   Negative for polydipsia,polyuria, or polyphagia.  ROS: See pertinent positives and negatives per HPI.  Past Medical History:  Diagnosis  Date  . 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 Anxiety   . Asthma   . Chicken pox   . Depression   . Genital warts   . GERD (gastroesophageal reflux disease)   . History of diverticulitis   . History of genital warts   . Hx: UTI (urinary tract infection)   . Migraines   . Urine incontinence     Past Surgical History:  Procedure Laterality Date  . CERVICAL BIOPSY  W/ LOOP ELECTRODE EXCISION  1996  . CHOLECYSTECTOMY  1997  . HERNIA REPAIR  1976  . TONSILLECTOMY AND ADENOIDECTOMY  1996  . urethral stretching  1980    Family History  Problem Relation Age of Onset  . Arthritis Mother   . Depression Mother   . Hyperlipidemia Mother   . Miscarriages / [place] Mother   . Skin cancer Mother   . Early death Father   . Alcohol abuse Maternal Grandmother   . Arthritis Maternal Grandmother   . Depression Maternal Grandmother   . Diabetes Maternal Grandmother   . Hyperlipidemia Maternal Grandmother   . Hypertension Maternal Grandmother   . Kidney disease Maternal Grandmother   . Arthritis Maternal Grandfather   . Hypertension Maternal Grandfather   . Kidney disease Maternal Grandfather   . Heart attack Maternal Grandfather   . Leukemia Maternal Grandfather   . Bone cancer Maternal Grandfather   . Alcohol abuse Paternal Grandmother   . Cancer Paternal Grandmother   . Alcohol abuse Paternal Grandfather     Social History   Socioeconomic History  . Marital status: Married    Spouse name: Not on file  . Number of children: 0  .  Years of education: Not on file  . Highest education level: Not on file  Occupational History  . Not on file  Tobacco Use  . Smoking status: Never Smoker  . Smokeless tobacco: Never Used  Vaping Use  . Vaping Use: Never used  Substance and Sexual Activity  . Alcohol use: No  . Drug use: No  . Sexual activity: Yes    Partners: Male  Other Topics Concern  . Not on file  Social History Narrative  . Not on file   Social  Determinants of Health   Financial Resource Strain:   . Difficulty of Paying Living Expenses:   Food Insecurity:   . Worried About Charity fundraiser in the Last Year:   . Arboriculturist in the Last Year:   Transportation Needs:   . Film/video editor (Medical):   Marland Kitchen Lack of Transportation (Non-Medical):   Physical Activity:   . Days of Exercise per Week:   . Minutes of Exercise per Session:   Stress:   . Feeling of Stress :   Social Connections:   . Frequency of Communication with Friends and Family:   . Frequency of Social Gatherings with Friends and Family:   . Attends Religious Services:   . Active Member of Clubs or Organizations:   . Attends Archivist Meetings:   Marland Kitchen Marital Status:   Intimate Partner Violence:   . Fear of Current or Ex-Partner:   . Emotionally Abused:   Marland Kitchen Physically Abused:   . Sexually Abused:     Current Outpatient Medications:  .  acetaminophen (TYLENOL) 500 MG tablet, Take 500 mg by mouth every 8 (eight) hours as needed., Disp: , Rfl:  .  albuterol (VENTOLIN HFA) 108 (90 Base) MCG/ACT inhaler, Inhale 2 puffs into the lungs every 6 (six) hours as needed for wheezing or shortness of breath., Disp: 8 g, Rfl: 0 .  AUVI-Q 0.3 MG/0.3ML SOAJ injection, , Disp: , Rfl:  .  DEXILANT 60 MG capsule, Take 1 capsule (60 mg total) by mouth daily as needed., Disp: 90 capsule, Rfl: 1 .  diclofenac sodium (VOLTAREN) 1 % GEL, Apply 2 g topically 4 (four) times daily., Disp: 150 g, Rfl: 1 .  fluticasone (FLOVENT HFA) 110 MCG/ACT inhaler, Inhale 2 puffs into the lungs 2 (two) times daily., Disp: 1 Inhaler, Rfl: 1 .  gabapentin (NEURONTIN) 100 MG capsule, 2 cap in am 1 po in pm and 1 po qhs, Disp: 120 capsule, Rfl: 1 .  levocetirizine (XYZAL) 5 MG tablet, TAKE 1 TABLET BY MOUTH EVERY EVENING AS NEEDED, haven't started yet, Disp: , Rfl: 5 .  lidocaine (LIDODERM) 5 %, Place 1 patch onto the skin daily. Remove & Discard patch within 12 hours or as directed by  MD, Disp: 30 patch, Rfl: 0 .  rizatriptan (MAXALT-MLT) 10 MG disintegrating tablet, DISSOLVE 1 TABLET BY MOUTH AS NEEDED FOR MIGRAINE. MAY REPEAT IN 2 HOURS IF NEEDED, Disp: 9 tablet, Rfl: 2 .  sertraline (ZOLOFT) 100 MG tablet, Take 1 tablet (100 mg total) by mouth daily., Disp: 90 tablet, Rfl: 4 .  Spacer/Aero-Holding Chambers (AEROCHAMBER PLUS) inhaler, Use as instructed to use with inahaler., Disp: 1 each, Rfl: 1 .  spironolactone (ALDACTONE) 50 MG tablet, TAKE 1 TABLET BY MOUTH 2 TIMES DAILY., Disp: 180 tablet, Rfl: 1 .  traMADol (ULTRAM) 50 MG tablet, Take 1 tablet (50 mg total) by mouth 2 (two) times daily as needed., Disp: 60 tablet, Rfl: 0  EXAM:  VITALS per patient if applicable:BP 101/75   Ht 5\' 6"  (1.676 m)   LMP 03/02/2020   BMI 50.71 kg/m   GENERAL: alert, oriented, appears well and in no acute distress  HEENT: atraumatic, conjunctiva clear, no obvious abnormalities on inspection.  NECK: normal movements of the head and neck  LUNGS: on inspection no signs of respiratory distress, breathing rate appears normal, no obvious gross SOB, gasping or wheezing  CV: no obvious cyanosis  PSYCH/NEURO: pleasant and cooperative, no obvious depression or anxiety, speech and thought processing grossly intact  ASSESSMENT AND PLAN:  Discussed the following assessment and plan: Orders Placed This Encounter  Procedures  . BASIC METABOLIC PANEL WITH GFR  . TSH  . CBC with Differential/Platelet  . Hemoglobin A1c  . Urinalysis, Routine w reflex microscopic    Abdominal pain, unspecified abdominal location  We discussed possible etiologies. Could be related to her hx of IBS. Small portions and avoid food that could aggravate problem.  Gastroesophageal reflux disease, unspecified whether esophagitis present It is not well controlled. Resume Dexilant 60 mg daily. GERD precautions.  Dizziness We discussed possible etiologies. Hx does not suggest vertigo. Fall  precautions. Adequate hydration. She will come for blood work next week.  Urinary incontinence, unspecified type  No other acute urinary symptom. ?Overactive bladder.  Impaired fasting glucose - Plan: Hemoglobin A1c Healthy life style for primary prevention of diabetes.  Abnormal menstrual cycle  Most likely related to perimenopause. Further recommendations according to lab results.  Some of above complaints could be related to some of her chronic medical problems, We will obtain some labs next week.  I discussed the assessment and treatment plan with the patient. Alexa West was provided an opportunity to ask questions and all were answered. She  agreed with the plan and demonstrated an understanding of the instructions.    Return if symptoms worsen or fail to improve, for Labs next week.    Rayshawn Maney [place], MD

## 2020-03-05 ENCOUNTER — Encounter: Payer: Self-pay | Admitting: Family Medicine

## 2020-03-06 ENCOUNTER — Ambulatory Visit: Payer: No Typology Code available for payment source | Admitting: Podiatry

## 2020-03-06 ENCOUNTER — Other Ambulatory Visit: Payer: Self-pay

## 2020-03-06 DIAGNOSIS — M19172 Post-traumatic osteoarthritis, left ankle and foot: Secondary | ICD-10-CM

## 2020-03-06 DIAGNOSIS — M21861 Other specified acquired deformities of right lower leg: Secondary | ICD-10-CM

## 2020-03-06 DIAGNOSIS — M19071 Primary osteoarthritis, right ankle and foot: Secondary | ICD-10-CM

## 2020-03-06 DIAGNOSIS — M2141 Flat foot [pes planus] (acquired), right foot: Secondary | ICD-10-CM

## 2020-03-06 DIAGNOSIS — M216X1 Other acquired deformities of right foot: Secondary | ICD-10-CM | POA: Diagnosis not present

## 2020-03-06 DIAGNOSIS — M2142 Flat foot [pes planus] (acquired), left foot: Secondary | ICD-10-CM

## 2020-03-06 DIAGNOSIS — M722 Plantar fascial fibromatosis: Secondary | ICD-10-CM | POA: Diagnosis not present

## 2020-03-06 MED ORDER — METHYLPREDNISOLONE 4 MG PO TBPK
ORAL_TABLET | ORAL | 0 refills | Status: DC
Start: 2020-03-06 — End: 2020-03-27

## 2020-03-06 MED FILL — METHYLPREDNISOLONE 4 MG TAB: 4 | 6 days supply | Qty: 21 | Fill #0

## 2020-03-06 NOTE — Progress Notes (Signed)
Subjective:  Patient ID: Alexa West, female    DOB: Apr 07, 1971,  MRN: [id number]  Chief Complaint  Patient presents with  . Ankle Pain    Recurrent ankle pain - L lateral. Pt stated, "Pain = 5-6/10 most of the time. I can't remember if the injections helped".  . Foot Pain    R midfoot to lateral ankle + R heel. x1 yr. Pt was diagnosed with plantar fasciitis. Pt stated, "Pain = 9/10. It feels somewhat better when I walk around. I can't remember if the injections helped or not. I went to ortho, who diagnosed me with arthritis and said that I needed to lose weight. They referred me to pain management. Pain management gave me Tramadol, but that didn't help. They've scheduled a hip injection for tomorrow".    49 y.o. female presents with the above complaint. History confirmed with patient.  The right heel pain is the worst pain for her, followed by the right midfoot dorsal pain that the left ankle pain.  She has fractured her left ankle twice.  She states she received orthotics from our office previously but she does wear them because they are not very comfortable.  She was supposed to bring them back for adjustments but did not due to Covid.  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  Objective:  Physical Exam: warm, good capillary refill, no trophic changes or ulcerative lesions, normal DP and PT pulses and normal sensory exam. Left Ankle: limited ROM of DF of ankle, mild tenderness at end ROM, minimal edema Right Foot: Pain on palpation to medial calcaneal tubercle and plantar fascia insertion, along the dorsal and plantar midtarsal joint  Previous x-rays from 10/01/2018 showed inferior calcaneal enthesophyte  Narrative & Impression  CLINICAL DATA:  Dorsolateral foot pain  EXAM: MRI OF THE RIGHT FOREFOOT WITHOUT CONTRAST  TECHNIQUE: Multiplanar, multisequence MR imaging of the ankle was performed. No intravenous contrast was administered.  COMPARISON:  X-ray  05/06/2019  FINDINGS: Bones/Joint/Cartilage  No acute fracture or dislocation. Mild degenerative changes most pronounced at the calcaneocuboid and third tarsometatarsal joints. No joint effusion.  Ligaments  The distal plantar calcaneocuboid ligament is thickened and heterogeneous with high signal suggesting high-grade sprain and partial tearing (series 6, images 44-46; series 9, images 9-10). The remaining intrinsic foot ligaments including the Lisfranc ligament are intact.  Muscles and Tendons  Normal muscle bulk and signal. Flexor and extensor tendons of the foot are intact. No tenosynovitis.  Soft tissues  Soft tissue edema at the dorsal lateral aspect of the midfoot seen only at the edge of the field of view (series 6, image 46). Lobulated fluid intensity structure emanating from the dorsal lateral aspect of the midfoot at the level of the talonavicular joint measuring approximately 2.3 x 1.0 cm, which may represent a small ganglion (series 9, image 12).  IMPRESSION: 1. High-grade sprain of the distal plantar calcaneocuboid ligament. 2. Soft tissue edema at the dorsolateral aspect of the midfoot. 3. Mild degenerative changes most pronounced at the calcaneocuboid and third tarsometatarsal joints. 4. Lobulated fluid intensity structure emanating from the dorsolateral aspect of the midfoot at the level of the talonavicular joint, which may represent a small ganglion cyst.   Electronically Signed   By: Davina Poke M.D.   On: 07/01/2019 09:31    Assessment:   1. Plantar fasciitis   2. Gastrocnemius equinus of right lower extremity   3. 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 Post-traumatic osteoarthritis of  left ankle   5. Pes planus of both feet      Plan:  Patient was evaluated and treated and all questions answered.  -For the right midfoot pain she has early degenerative changes in the midtarsal joint, and MRI previously shown  sprain and/or tearing of the plantar calcaneocuboid ligament.  I believe this is worsened due to her pes planus and equinus.  I recommend stretching for the equinus, supporting the pes planus with orthotics.  She will bring these to her next visit for adjustments. -Recommend to try using Voltaren gel 3-4 times daily in the midfoot  -Educated patient on stretching and icing of the affected limb -Night splint dispensed -Plantar fascial brace dispensed -Injection delivered to the plantar fascia of the right foot. -Rx for medrol pack. Educated on use, risks, and benefits of the medication  After sterile prep with povidone-iodine solution and alcohol, the right foot was injected with 0.5cc 2% xylocaine plain, 0.5cc 0.5% marcaine plain, 5mg  triamcinolone acetonide, and 2mg  dexamethasone was injected along the medial plantar fascia at the insertion on the plantar calcaneus. The patient tolerated the procedure well without complication.  -For the ankle pain on her left side, this is only minimally bothersome for her.  I discussed with her this likely secondary posttraumatic arthritis from her ankle fractures.  If becomes worse we will evaluate with an MRI.  Return in about 1 month (around 04/06/2020) for recheck plantar fasciitis.

## 2020-03-07 ENCOUNTER — Other Ambulatory Visit: Payer: Self-pay

## 2020-03-07 ENCOUNTER — Other Ambulatory Visit: Payer: No Typology Code available for payment source

## 2020-03-07 ENCOUNTER — Ambulatory Visit: Payer: Self-pay | Admitting: Podiatry

## 2020-03-07 ENCOUNTER — Encounter
Payer: No Typology Code available for payment source | Attending: Physical Medicine & Rehabilitation | Admitting: Physical Medicine & Rehabilitation

## 2020-03-07 ENCOUNTER — Encounter: Payer: Self-pay | Admitting: Physical Medicine & Rehabilitation

## 2020-03-07 VITALS — BP 118/69 | HR 74 | Temp 98.6°F | Ht 66.0 in | Wt 307.2 lb

## 2020-03-07 DIAGNOSIS — M1612 Unilateral primary osteoarthritis, left hip: Secondary | ICD-10-CM

## 2020-03-07 DIAGNOSIS — R7301 Impaired fasting glucose: Secondary | ICD-10-CM

## 2020-03-07 DIAGNOSIS — N926 Irregular menstruation, unspecified: Secondary | ICD-10-CM

## 2020-03-07 DIAGNOSIS — R32 Unspecified urinary incontinence: Secondary | ICD-10-CM

## 2020-03-07 DIAGNOSIS — M797 Fibromyalgia: Secondary | ICD-10-CM | POA: Diagnosis present

## 2020-03-07 DIAGNOSIS — R109 Unspecified abdominal pain: Secondary | ICD-10-CM

## 2020-03-07 NOTE — Progress Notes (Signed)
LEFT hip intra-articular injection under fluoro guidance  Severe left hip osteoarthritis with pain that limits mobility,, unresponsive to medication management.  Informed consent was obtained after describing risks and benefits of the procedure with patient . Patient has given written consent  Patient placed supine on exam table. The femoral artery was scanned with Doppler and marked The femoral head was identified as well as this femoral neck. Area was marked then prepped with Betadine, sterile drape  A 25-gauge 1.5 inch needle was used to anesthetize the skin and subcutaneous tissue with 3 cc of 1% lidocaine under direct ultrasound visualization. Then a 22-gauge 5in quinke spinal needle needle was inserted underfluoro targeting the junction of the femoral neck and the femoral head. Once bone contact was made a solution containing 1.5 cc of 6 mg/cc betamethasone and 4 cc of 1% lidocaine were injected under ultrasound visualization. Patient tolerated procedure well Images saved

## 2020-03-07 NOTE — Patient Instructions (Signed)
Left intra articular hip injection    Do not take oral steroid (prescribed for heel pain )  for 2-3 days post injection

## 2020-03-07 NOTE — Progress Notes (Signed)
  [facility]   Name: Alexa West DOB:November 08, 1970 MRN: [id number]  Date:03/07/2020  Physician: Alysia Penna, MD    Nurse/CMA: Laurissa Cowper RN  Allergies:  Allergies  Allergen Reactions  . Cashew Nut Oil   . Milk-Related Compounds Diarrhea  . Wheat Bran   . Cephalosporins Hives and Rash  . Penicillins Hives and Rash    Consent Signed: Yes.    Is patient diabetic? No.  CBG today?   Pregnant: No. LMP: Patient's last menstrual period was 03/02/2020. (age 43-56)  Anticoagulants: no Anti-inflammatory: no Antibiotics: no  Procedure: left hip injection of steroid  Position: Supine Start Time: 12:08 End Time: 12:16 Fluoro Time:49 sec  RN/CMA Biomedical engineer    Time 11:37 12:22    BP 118/69 118/71    Pulse 74 61    Respirations 14 14    O2 Sat 97 97    S/S 6 6    Pain Level 4/10 6/10     D/C home with no driver, patient A & O X 3, D/C instructions reviewed, and sits independently.

## 2020-03-08 LAB — BASIC METABOLIC PANEL WITH GFR
BUN: 8 mg/dL (ref 7–25)
CO2: 25 mmol/L (ref 20–32)
Calcium: 9.1 mg/dL (ref 8.6–10.2)
Chloride: 103 mmol/L (ref 98–110)
Creat: 0.67 mg/dL (ref 0.50–1.10)
GFR, Est African American: 120 mL/min/{1.73_m2} (ref >=60)
GFR, Est Non African American: 104 mL/min/{1.73_m2} (ref >=60)
Glucose, Bld: 103 mg/dL — ABNORMAL HIGH (ref 65–99)
Potassium: 4.1 mmol/L (ref 3.5–5.3)
Sodium: 136 mmol/L (ref 135–146)

## 2020-03-08 LAB — CBC WITH DIFFERENTIAL/PLATELET
Absolute Monocytes: 399 cells/uL (ref 200–950)
Basophils Absolute: 38 cells/uL (ref 0–200)
Basophils Relative: 0.4 %
Eosinophils Absolute: 57 cells/uL (ref 15–500)
Eosinophils Relative: 0.6 %
HCT: 43.8 % (ref 35.0–45.0)
Hemoglobin: 14.4 g/dL (ref 11.7–15.5)
Lymphs Abs: 1710 cells/uL (ref 850–3900)
MCH: 28.3 pg (ref 27.0–33.0)
MCHC: 32.9 g/dL (ref 32.0–36.0)
MCV: 86.1 fL (ref 80.0–100.0)
MPV: 10.1 fL (ref 7.5–12.5)
Monocytes Relative: 4.2 %
Neutro Abs: 7296 cells/uL (ref 1500–7800)
Neutrophils Relative %: 76.8 %
Platelets: 363 10*3/uL (ref 140–400)
RBC: 5.09 10*6/uL (ref 3.80–5.10)
RDW: 13.4 % (ref 11.0–15.0)
Total Lymphocyte: 18 %
WBC: 9.5 10*3/uL (ref 3.8–10.8)

## 2020-03-08 LAB — URINALYSIS, ROUTINE W REFLEX MICROSCOPIC
Bilirubin Urine: NEGATIVE
Glucose, UA: NEGATIVE
Hgb urine dipstick: NEGATIVE
Ketones, ur: NEGATIVE
Leukocytes,Ua: NEGATIVE
Nitrite: NEGATIVE
Protein, ur: NEGATIVE
Specific Gravity, Urine: 1.01 (ref 1.001–1.03)
pH: 6.5 (ref 5.0–8.0)

## 2020-03-08 LAB — TSH: TSH: 1.72 mIU/L

## 2020-03-08 LAB — HEMOGLOBIN A1C
Hgb A1c MFr Bld: 5.7 % of total Hgb — ABNORMAL HIGH (ref <5.7)
Mean Plasma Glucose: 117 (calc)
eAG (mmol/L): 6.5 (calc)

## 2020-03-13 ENCOUNTER — Other Ambulatory Visit: Payer: Self-pay

## 2020-03-13 ENCOUNTER — Ambulatory Visit: Payer: No Typology Code available for payment source | Admitting: Orthotics

## 2020-03-13 DIAGNOSIS — M2141 Flat foot [pes planus] (acquired), right foot: Secondary | ICD-10-CM

## 2020-03-13 DIAGNOSIS — M2142 Flat foot [pes planus] (acquired), left foot: Secondary | ICD-10-CM

## 2020-03-13 DIAGNOSIS — M216X1 Other acquired deformities of right foot: Secondary | ICD-10-CM

## 2020-03-13 DIAGNOSIS — M19071 Primary osteoarthritis, right ankle and foot: Secondary | ICD-10-CM

## 2020-03-13 DIAGNOSIS — M722 Plantar fascial fibromatosis: Secondary | ICD-10-CM

## 2020-03-13 DIAGNOSIS — M21861 Other specified acquired deformities of right lower leg: Secondary | ICD-10-CM

## 2020-03-13 NOTE — Progress Notes (Signed)
Excavated material from arch to make more flexible;also from heel to make it more softe.

## 2020-03-15 MED FILL — DEXILANT DR 60 MG CAPSULE: 60 | 30 days supply | Qty: 30 | Fill #0

## 2020-03-17 ENCOUNTER — Ambulatory Visit: Payer: No Typology Code available for payment source | Admitting: Physical Medicine & Rehabilitation

## 2020-03-22 DIAGNOSIS — M25561 Pain in right knee: Secondary | ICD-10-CM

## 2020-03-24 ENCOUNTER — Ambulatory Visit
Admission: RE | Admit: 2020-03-24 | Discharge: 2020-03-24 | Disposition: A | Discharge status: Home / Self Care | Payer: No Typology Code available for payment source | Source: Ambulatory Visit | Attending: Physical Medicine & Rehabilitation | Admitting: Physical Medicine & Rehabilitation

## 2020-03-24 ENCOUNTER — Other Ambulatory Visit: Payer: Self-pay

## 2020-03-27 ENCOUNTER — Encounter: Payer: Self-pay | Admitting: Family Medicine

## 2020-03-27 ENCOUNTER — Ambulatory Visit (INDEPENDENT_AMBULATORY_CARE_PROVIDER_SITE_OTHER): Payer: No Typology Code available for payment source | Admitting: Family Medicine

## 2020-03-27 ENCOUNTER — Other Ambulatory Visit: Payer: Self-pay

## 2020-03-27 VITALS — BP 110/72 | HR 72 | Resp 16 | Ht 66.0 in | Wt 307.0 lb

## 2020-03-27 DIAGNOSIS — F3341 Major depressive disorder, recurrent, in partial remission: Secondary | ICD-10-CM

## 2020-03-27 DIAGNOSIS — Z789 Other specified health status: Secondary | ICD-10-CM

## 2020-03-27 DIAGNOSIS — E559 Vitamin D deficiency, unspecified: Secondary | ICD-10-CM

## 2020-03-27 DIAGNOSIS — Z13220 Encounter for screening for lipoid disorders: Secondary | ICD-10-CM

## 2020-03-27 DIAGNOSIS — I8393 Asymptomatic varicose veins of bilateral lower extremities: Secondary | ICD-10-CM

## 2020-03-27 DIAGNOSIS — Z0189 Encounter for other specified special examinations: Secondary | ICD-10-CM

## 2020-03-27 DIAGNOSIS — Z0000 Encounter for general adult medical examination without abnormal findings: Secondary | ICD-10-CM | POA: Diagnosis not present

## 2020-03-27 NOTE — Patient Instructions (Signed)
Today you have you routine preventive visit. A few things to remember from today's visit:   Recurrent major depressive disorder, in partial remission ([place]) - Plan: VITAMIN D 25 Hydroxy (Vit-D Deficiency, Fractures)  Varicose veins of both lower extremities, unspecified whether complicated - Plan: Ambulatory referral to Vascular Surgery  Unknown status of immunity to COVID-19 virus - Plan: SARS-COV-2 IgG  Screening for lipid disorders - Plan: Lipid panel  Routine general medical examination at a health care facility  If you need refills please call your pharmacy. Do not use My Chart to request refills or for acute issues that need immediate attention.    Please be sure medication list is accurate. If a new problem present, please set up appointment sooner than planned today.   At least 150 minutes of moderate exercise per week, daily brisk walking for 15-30 min is a good exercise option. Healthy diet low in saturated (animal) fats and sweets and consisting of fresh fruits and vegetables, lean meats such as fish and white chicken and whole grains.  These are some of recommendations for screening depending of age and risk factors:  - Vaccines:  Tdap vaccine every 10 years.  Shingles vaccine recommended at age 83, could be given after 48 years of age but not sure about insurance coverage.   Pneumonia vaccines: Pneumovax at 57. Sometimes Pneumovax is giving earlier if history of smoking, lung disease,diabetes,kidney disease among some.  Screening for diabetes at age 30 and every 3 years.  Cervical cancer prevention:  Pap smear starts at 48 years of age and continues periodically until 48 years old in low risk women. Pap smear every 3 years between 53 and 75 years old. Pap smear every 3-5 years between women 73 and older if pap smear negative and HPV screening negative.   -Breast cancer: Mammogram: There is disagreement between experts about when to start screening in low risk  asymptomatic female but recent recommendations are to start screening at 67 and not later than 48 years old , every 1-2 years and after 48 yo q 2 years. Screening is recommended until 48 years old but some women can continue screening depending of healthy issues.  Colon cancer screening: Has been recently changed to 48 yo. Insurance may not cover until you are 48 years old. Screening is recommended until 48 years old.Please call your insurance and ask about coverage.   Cholesterol disorder screening at age 81 and every 3 years.  Also recommended:  1. Dental visit- Brush and floss your teeth twice daily; visit your dentist twice a year. 2. Eye doctor- Get an eye exam at least every 2 years. 3. Helmet use- Always wear a helmet when riding a bicycle, motorcycle, rollerblading or skateboarding. 4. Safe sex- If you may be exposed to sexually transmitted infections, use a condom. 5. Seat belts- Seat belts can save your live; always wear one. 6. Smoke/Carbon Monoxide detectors- These detectors need to be installed on the appropriate level of your home. Replace batteries at least once a year. 7. Skin cancer- When out in the sun please cover up and use sunscreen 15 SPF or higher. 8. Violence- If anyone is threatening or hurting you, please tell your healthcare provider.  9. Drink alcohol in moderation- Limit alcohol intake to one drink or less per day. Never drink and drive. 10. Calcium supplementation 1000 to 1200 mg daily, ideally through your diet.  Vitamin D supplementation 800 units daily.

## 2020-03-27 NOTE — Progress Notes (Signed)
HPI: Alexa West is a 49 y.o. female, who is here today for her routine physical.  Last CPE: 01/02/18.  Regular exercise 3 or more time per week: Walking at work, 8000 steps. Following a healthy diet: Not consistently. She lives with her husband.  Chronic medical problems: Chronic back pain,fibromyalgia,OA,migraine headaches,  Pap smear 02/03/19. She follows with gyn , Dr Sabra Heck.  There is no immunization history on file for this patient.  Mammogram: 02/13/18, planning on arranging appt. Colonoscopy: Never. DEXA: N/A  Hep C screening: 10/05/08 NR  Concerns today:  Husband's co-worker was exposed, she did not but would like to have Ab check. She does not want to take COVID 19 vaccine and vit D. No hx of vit D deficiency.  Concerns about some mole on her back, lesions have been there for years. She is not sure about changes.  Varicose veins in LE's. She would like vein specialist evaluation. Problem seems to be getting worse.  Depression: She is on Sertraline 100 mg,prescribed by her gyn. Medication is helping. No side effects reported.  Review of Systems  Constitutional: Positive for fatigue. Negative for appetite change and fever.  HENT: Negative for hearing loss, mouth sores, sore throat and trouble swallowing.   Eyes: Negative for redness and visual disturbance.  Respiratory: Negative for cough, shortness of breath and wheezing.   Cardiovascular: Negative for chest pain and leg swelling.  Gastrointestinal: Negative for abdominal pain, nausea and vomiting.       No changes in bowel habits.  Endocrine: Negative for cold intolerance, heat intolerance, polydipsia, polyphagia and polyuria.  Genitourinary: Negative for decreased urine volume, dysuria, hematuria, vaginal bleeding and vaginal discharge.  Musculoskeletal: Positive for arthralgias and back pain. Negative for gait problem.  Skin: Negative for color change and rash.  Allergic/Immunologic: Positive  for environmental allergies.  Neurological: Negative for syncope, weakness and headaches.  Hematological: Negative for adenopathy. Does not bruise/bleed easily.  Psychiatric/Behavioral: Negative for behavioral problems and confusion.  All other systems reviewed and are negative.  Current Outpatient Medications on File Prior to Visit  Medication Sig Dispense Refill  . acetaminophen (TYLENOL) 500 MG tablet Take 500 mg by mouth every 8 (eight) hours as needed.    Marland Kitchen albuterol (VENTOLIN HFA) 108 (90 Base) MCG/ACT inhaler Inhale 2 puffs into the lungs every 6 (six) hours as needed for wheezing or shortness of breath. 8 g 0  . AUVI-Q 0.3 MG/0.3ML SOAJ injection     . DEXILANT 60 MG capsule Take 1 capsule (60 mg total) by mouth daily as needed. 90 capsule 1  . fluticasone (FLOVENT HFA) 110 MCG/ACT inhaler Inhale 2 puffs into the lungs 2 (two) times daily. 1 Inhaler 1  . gabapentin (NEURONTIN) 100 MG capsule 2 cap in am 1 po in pm and 1 po qhs 120 capsule 1  . levocetirizine (XYZAL) 5 MG tablet TAKE 1 TABLET BY MOUTH EVERY EVENING AS NEEDED, haven't started yet  5  . lidocaine (LIDODERM) 5 % Place 1 patch onto the skin daily. Remove & Discard patch within 12 hours or as directed by MD 30 patch 0  . rizatriptan (MAXALT-MLT) 10 MG disintegrating tablet DISSOLVE 1 TABLET BY MOUTH AS NEEDED FOR MIGRAINE. MAY REPEAT IN 2 HOURS IF NEEDED 9 tablet 2  . sertraline (ZOLOFT) 100 MG tablet Take 1 tablet (100 mg total) by mouth daily. 90 tablet 4  . Spacer/Aero-Holding Chambers (AEROCHAMBER PLUS) inhaler Use as instructed to use with inahaler. 1 each 1  .  spironolactone (ALDACTONE) 50 MG tablet TAKE 1 TABLET BY MOUTH 2 TIMES DAILY. 180 tablet 1   No current facility-administered medications on file prior to visit.   Past Medical History:  Diagnosis Date  . 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 Anxiety   . Asthma   . Chicken pox   . Depression   . Genital warts   . GERD (gastroesophageal reflux  disease)   . History of diverticulitis   . History of genital warts   . Hx: UTI (urinary tract infection)   . Migraines   . Urine incontinence     Past Surgical History:  Procedure Laterality Date  . CERVICAL BIOPSY  W/ LOOP ELECTRODE EXCISION  1996  . CHOLECYSTECTOMY  1997  . HERNIA REPAIR  1976  . TONSILLECTOMY AND ADENOIDECTOMY  1996  . urethral stretching  1980    Allergies  Allergen Reactions  . Cashew Nut Oil   . Milk-Related Compounds Diarrhea  . Wheat Bran   . Cephalosporins Hives and Rash  . Penicillins Hives and Rash    Family History  Problem Relation Age of Onset  . Arthritis Mother   . Depression Mother   . Hyperlipidemia Mother   . Miscarriages / [place] Mother   . Skin cancer Mother   . Early death Father   . Alcohol abuse Maternal Grandmother   . Arthritis Maternal Grandmother   . Depression Maternal Grandmother   . Diabetes Maternal Grandmother   . Hyperlipidemia Maternal Grandmother   . Hypertension Maternal Grandmother   . Kidney disease Maternal Grandmother   . Arthritis Maternal Grandfather   . Hypertension Maternal Grandfather   . Kidney disease Maternal Grandfather   . Heart attack Maternal Grandfather   . Leukemia Maternal Grandfather   . Bone cancer Maternal Grandfather   . Alcohol abuse Paternal Grandmother   . Cancer Paternal Grandmother   . Alcohol abuse Paternal Grandfather     Social History   Socioeconomic History  . Marital status: Married    Spouse name: Not on file  . Number of children: 0  . Years of education: Not on file  . Highest education level: Not on file  Occupational History  . Not on file  Tobacco Use  . Smoking status: Never Smoker  . Smokeless tobacco: Never Used  Vaping Use  . Vaping Use: Never used  Substance and Sexual Activity  . Alcohol use: No  . Drug use: No  . Sexual activity: Yes    Partners: Male  Other Topics Concern  . Not on file  Social History Narrative  . Not on file    Social Determinants of Health   Financial Resource Strain:   . Difficulty of Paying Living Expenses: Not on file  Food Insecurity:   . Worried About Charity fundraiser in the Last Year: Not on file  . Ran Out of Food in the Last Year: Not on file  Transportation Needs:   . Lack of Transportation (Medical): Not on file  . Lack of Transportation (Non-Medical): Not on file  Physical Activity:   . Days of Exercise per Week: Not on file  . Minutes of Exercise per Session: Not on file  Stress:   . Feeling of Stress : Not on file  Social Connections:   . Frequency of Communication with Friends and Family: Not on file  . Frequency of Social Gatherings with Friends and Family: Not on file  . Attends Religious  Services: Not on file  . Active Member of Clubs or Organizations: Not on file  . Attends Archivist Meetings: Not on file  . Marital Status: Not on file    Vitals:   03/27/20 0853  BP: 110/72  Pulse: 72  Resp: 16  SpO2: 97%   Body mass index is 49.55 kg/m.   Wt Readings from Last 3 Encounters:  03/27/20 (!) 307 lb (139.3 kg)  03/07/20 (!) 307 lb 3.2 oz (139.3 kg)  02/15/20 (!) 314 lb 3.2 oz (142.5 kg)   Physical Exam Vitals and nursing note reviewed.  Constitutional:      General: She is not in acute distress.    Appearance: She is well-developed.  HENT:     Head: Normocephalic and atraumatic.     Right Ear: Hearing, tympanic membrane, ear canal and external ear normal.     Left Ear: Hearing, tympanic membrane, ear canal and external ear normal.     Mouth/Throat:     Mouth: Mucous membranes are moist.     Pharynx: Oropharynx is clear. Uvula midline.  Eyes:     Extraocular Movements: Extraocular movements intact.     Conjunctiva/sclera: Conjunctivae normal.     Pupils: Pupils are equal, round, and reactive to light.  Neck:     Thyroid: No thyromegaly.     Trachea: No tracheal deviation.  Cardiovascular:     Rate and Rhythm: Normal rate and regular  rhythm.     Pulses:          Dorsalis pedis pulses are 2+ on the right side and 2+ on the left side.     Heart sounds: No murmur heard.      Comments: RLE tortuous varicose veins lateral thigh and telangiectasis pretibial.  LLE moderate size varices lateral thigh. Pulmonary:     Effort: Pulmonary effort is normal. No respiratory distress.     Breath sounds: Normal breath sounds.  Abdominal:     Palpations: Abdomen is soft. There is no hepatomegaly or mass.     Tenderness: There is no abdominal tenderness.  Genitourinary:    Comments: Deferred to gyn. Musculoskeletal:     Comments: No signs of synovitis appreciated.  Lymphadenopathy:     Cervical: No cervical adenopathy.     Upper Body:     Right upper body: No supraclavicular adenopathy.     Left upper body: No supraclavicular adenopathy.  Skin:    General: Skin is warm.     Findings: No erythema or rash.          Comments: No suspicious lesions, 2-3 mm on upper back.  Neurological:     Mental Status: She is alert and oriented to person, place, and time.     Cranial Nerves: No cranial nerve deficit.     Coordination: Coordination normal.     Gait: Gait normal.     Deep Tendon Reflexes:     Reflex Scores:      Bicep reflexes are 2+ on the right side and 2+ on the left side.      Patellar reflexes are 2+ on the right side and 2+ on the left side. Psychiatric:        Mood and Affect: Mood and affect normal.        Speech: Speech normal.     Comments: Well groomed, good eye contact.   ASSESSMENT AND PLAN:  Alexa West was here today annual physical examination.  Orders Placed This Encounter  Procedures  . VITAMIN D 25 Hydroxy (Vit-D Deficiency, Fractures)  . Lipid panel  . SAR CoV2 Serology (COVID 19)AB(IGG)IA  . SARS-CoV-2 Antibody(IgG)Spike,Semi-Quantitative  . Ambulatory referral to Vascular Surgery   Lab Results  Component Value Date   CHOL 184 03/27/2020   HDL 41 (L) 03/27/2020   LDLCALC 116 (H)  03/27/2020   TRIG 154 (H) 03/27/2020   CHOLHDL 4.5 03/27/2020   Routine general medical examination at a health care facility We discussed the importance of regular physical activity and healthy diet for prevention of chronic illness and/or complications. Preventive guidelines reviewed. Vaccination up to date. Continue following with gyn for her female preventive care. Reassured about skin lesions, no suspicious. Recommend continuing monitoring for changes. Next CPE in a year.  The 10-year ASCVD risk score Mikey Bussing DC Brooke Bonito., et al., 2013) is: 2%   Values used to calculate the score:     Age: 89 years     Sex: Female     Is Non-Hispanic African American: No     Diabetic: Yes     Tobacco smoker: No     Systolic Blood Pressure: 621 mmHg     Is BP treated: No     HDL Cholesterol: 41 mg/dL     Total Cholesterol: 184 mg/dL  Recurrent major depressive disorder, in partial remission ([facility]) No changes in current management. Continue following with gyn.  Varicose veins of both lower extremities, unspecified whether complicated Appt with vascular will be arranged.  Unknown status of immunity to COVID-19 virus -     SAR CoV2 Serology (COVID 19)AB(IGG)IA  Screening for lipid disorders -     Lipid panel   Patient request for diagnostic testing -     SAR CoV2 Serology (COVID 19)AB(IGG)IA   Return in 1 year (on 03/27/2021).   Marsi Turvey G. [place], MD  [facility]. [place] office.   Today you have you routine preventive visit. A few things to remember from today's visit:   Recurrent major depressive disorder, in partial remission ([place]) - Plan: VITAMIN D 25 Hydroxy (Vit-D Deficiency, Fractures)  Varicose veins of both lower extremities, unspecified whether complicated - Plan: Ambulatory referral to Vascular Surgery  Unknown status of immunity to COVID-19 virus - Plan: SARS-COV-2 IgG  Screening for lipid disorders - Plan: Lipid panel  Routine general medical examination at  a health care facility  If you need refills please call your pharmacy. Do not use My Chart to request refills or for acute issues that need immediate attention.    Please be sure medication list is accurate. If a new problem present, please set up appointment sooner than planned today.   At least 150 minutes of moderate exercise per week, daily brisk walking for 15-30 min is a good exercise option. Healthy diet low in saturated (animal) fats and sweets and consisting of fresh fruits and vegetables, lean meats such as fish and white chicken and whole grains.  These are some of recommendations for screening depending of age and risk factors:  - Vaccines:  Tdap vaccine every 10 years.  Shingles vaccine recommended at age 29, could be given after 48 years of age but not sure about insurance coverage.   Pneumonia vaccines: Pneumovax at 68. Sometimes Pneumovax is giving earlier if history of smoking, lung disease,diabetes,kidney disease among some.  Screening for diabetes at age 15 and every 3 years.  Cervical cancer prevention:  Pap smear starts at 48 years of age and continues periodically until 48 years old in  low risk women. Pap smear every 3 years between 30 and 24 years old. Pap smear every 3-5 years between women 67 and older if pap smear negative and HPV screening negative.   -Breast cancer: Mammogram: There is disagreement between experts about when to start screening in low risk asymptomatic female but recent recommendations are to start screening at 47 and not later than 48 years old , every 1-2 years and after 48 yo q 2 years. Screening is recommended until 48 years old but some women can continue screening depending of healthy issues.  Colon cancer screening: Has been recently changed to 48 yo. Insurance may not cover until you are 48 years old. Screening is recommended until 48 years old.Please call your insurance and ask about coverage.   Cholesterol disorder screening at  age 49 and every 3 years.  Also recommended:  1. Dental visit- Brush and floss your teeth twice daily; visit your dentist twice a year. 2. Eye doctor- Get an eye exam at least every 2 years. 3. Helmet use- Always wear a helmet when riding a bicycle, motorcycle, rollerblading or skateboarding. 4. Safe sex- If you may be exposed to sexually transmitted infections, use a condom. 5. Seat belts- Seat belts can save your live; always wear one. 6. Smoke/Carbon Monoxide detectors- These detectors need to be installed on the appropriate level of your home. Replace batteries at least once a year. 7. Skin cancer- When out in the sun please cover up and use sunscreen 15 SPF or higher. 8. Violence- If anyone is threatening or hurting you, please tell your healthcare provider.  9. Drink alcohol in moderation- Limit alcohol intake to one drink or less per day. Never drink and drive. 10. Calcium supplementation 1000 to 1200 mg daily, ideally through your diet.  Vitamin D supplementation 800 units daily.

## 2020-03-28 ENCOUNTER — Encounter: Payer: No Typology Code available for payment source | Admitting: Physical Medicine & Rehabilitation

## 2020-03-28 ENCOUNTER — Other Ambulatory Visit: Payer: Self-pay | Admitting: *Deleted

## 2020-03-28 LAB — LIPID PANEL
Cholesterol: 184 mg/dL (ref <200)
HDL: 41 mg/dL — ABNORMAL LOW (ref >=50)
LDL Cholesterol (Calc): 116 mg/dL (calc) — ABNORMAL HIGH
Non-HDL Cholesterol (Calc): 143 mg/dL (calc) — ABNORMAL HIGH (ref <130)
Total CHOL/HDL Ratio: 4.5 (calc) (ref <5.0)
Triglycerides: 154 mg/dL — ABNORMAL HIGH (ref <150)

## 2020-03-28 LAB — SARS-COV-2 ANTIBODY(IGG)SPIKE,SEMI-QUANTITATIVE: SARS COV1 AB(IGG)SPIKE,SEMI QN: <1 index (ref <1.00)

## 2020-03-28 LAB — VITAMIN D 25 HYDROXY (VIT D DEFICIENCY, FRACTURES): Vit D, 25-Hydroxy: 18 ng/mL — ABNORMAL LOW (ref 30–100)

## 2020-03-28 MED ORDER — DICLOFENAC SODIUM 1 % EX GEL: 2.0000 g | Freq: Four times a day (QID) | CUTANEOUS | 0 refills | Status: DC

## 2020-03-28 MED FILL — DICLOFENAC SODIUM 1 % GEL: 1 | 37 days supply | Qty: 300 | Fill #0

## 2020-03-28 NOTE — Telephone Encounter (Signed)
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

## 2020-03-30 MED ORDER — VITAMIN D (ERGOCALCIFEROL) 1.25 MG (50000 UNIT) PO CAPS: 50000.0000 [IU] | ORAL_CAPSULE | ORAL | 0 refills | Status: AC

## 2020-03-31 MED FILL — VIT D2 1.25 MG (50,000 UNIT: 1.25 MG | 56 days supply | Qty: 8 | Fill #0

## 2020-04-04 NOTE — Progress Notes (Deleted)
49 y.o. G0P0000 Married White or Caucasian female here for annual exam.    No LMP recorded.          Sexually active: {yes no:314532}  The current method of family planning is {contraception:315051}.    Exercising: {yes no:314532}  {types:19826} Smoker:  {YES NO:22349}  Health Maintenance: Pap:  03-09-18 normal HPV HR neg, 02-01-2019 neg History of abnormal Pap:  yes MMG:  *** Colonoscopy:  none BMD:   none TDaP:  unsure Pneumonia vaccine(s):  no Shingrix:   no Hep C testing: neg Screening Labs: ***   reports that she has never smoked. She has never used smokeless tobacco. She reports that she does not drink alcohol and does not use drugs.  Past Medical History:  Diagnosis Date  . 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 Anxiety   . Asthma   . Chicken pox   . Depression   . Genital warts   . GERD (gastroesophageal reflux disease)   . History of diverticulitis   . History of genital warts   . Hx: UTI (urinary tract infection)   . Migraines   . Urine incontinence     Past Surgical History:  Procedure Laterality Date  . CERVICAL BIOPSY  W/ LOOP ELECTRODE EXCISION  1996  . CHOLECYSTECTOMY  1997  . HERNIA REPAIR  1976  . TONSILLECTOMY AND ADENOIDECTOMY  1996  . urethral stretching  1980    Current Outpatient Medications  Medication Sig Dispense Refill  . acetaminophen (TYLENOL) 500 MG tablet Take 500 mg by mouth every 8 (eight) hours as needed.    Marland Kitchen albuterol (VENTOLIN HFA) 108 (90 Base) MCG/ACT inhaler Inhale 2 puffs into the lungs every 6 (six) hours as needed for wheezing or shortness of breath. 8 g 0  . AUVI-Q 0.3 MG/0.3ML SOAJ injection     . DEXILANT 60 MG capsule Take 1 capsule (60 mg total) by mouth daily as needed. 90 capsule 1  . diclofenac Sodium (VOLTAREN) 1 % GEL Apply 2 g topically 4 (four) times daily. 300 g 0  . fluticasone (FLOVENT HFA) 110 MCG/ACT inhaler Inhale 2 puffs into the lungs 2 (two) times daily. 1 Inhaler 1  . gabapentin (NEURONTIN) 100  MG capsule 2 cap in am 1 po in pm and 1 po qhs 120 capsule 1  . levocetirizine (XYZAL) 5 MG tablet TAKE 1 TABLET BY MOUTH EVERY EVENING AS NEEDED, haven't started yet  5  . lidocaine (LIDODERM) 5 % Place 1 patch onto the skin daily. Remove & Discard patch within 12 hours or as directed by MD 30 patch 0  . rizatriptan (MAXALT-MLT) 10 MG disintegrating tablet DISSOLVE 1 TABLET BY MOUTH AS NEEDED FOR MIGRAINE. MAY REPEAT IN 2 HOURS IF NEEDED 9 tablet 2  . sertraline (ZOLOFT) 100 MG tablet Take 1 tablet (100 mg total) by mouth daily. 90 tablet 4  . Spacer/Aero-Holding Chambers (AEROCHAMBER PLUS) inhaler Use as instructed to use with inahaler. 1 each 1  . spironolactone (ALDACTONE) 50 MG tablet TAKE 1 TABLET BY MOUTH 2 TIMES DAILY. 180 tablet 1  . Vitamin D, Ergocalciferol, (DRISDOL) 1.25 MG (50000 UNIT) CAPS capsule Take 1 capsule (50,000 Units total) by mouth every 7 (seven) days for 8 doses. 8 capsule 0   No current facility-administered medications for this visit.    Family History  Problem Relation Age of Onset  . Arthritis Mother   . Depression Mother   . Hyperlipidemia Mother   .  Miscarriages / [place] Mother   . Skin cancer Mother   . Early death Father   . Alcohol abuse Maternal Grandmother   . Arthritis Maternal Grandmother   . Depression Maternal Grandmother   . Diabetes Maternal Grandmother   . Hyperlipidemia Maternal Grandmother   . Hypertension Maternal Grandmother   . Kidney disease Maternal Grandmother   . Arthritis Maternal Grandfather   . Hypertension Maternal Grandfather   . Kidney disease Maternal Grandfather   . Heart attack Maternal Grandfather   . Leukemia Maternal Grandfather   . Bone cancer Maternal Grandfather   . Alcohol abuse Paternal Grandmother   . Cancer Paternal Grandmother   . Alcohol abuse Paternal Grandfather     Review of Systems  Exam:   There were no vitals taken for this visit.     General appearance: alert, cooperative and appears  stated age Head: Normocephalic, without obvious abnormality, atraumatic Neck: no adenopathy, supple, symmetrical, trachea midline and thyroid {EXAM; THYROID:18604} Lungs: clear to auscultation bilaterally Breasts: {Exam; breast:13139::"normal appearance, no masses or tenderness"} Heart: regular rate and rhythm Abdomen: soft, non-tender; bowel sounds normal; no masses,  no organomegaly Extremities: extremities normal, atraumatic, no cyanosis or edema Skin: Skin color, texture, turgor normal. No rashes or lesions Lymph nodes: Cervical, supraclavicular, and axillary nodes normal. No abnormal inguinal nodes palpated Neurologic: Grossly normal   Pelvic: External genitalia:  no lesions              Urethra:  normal appearing urethra with no masses, tenderness or lesions              Bartholins and Skenes: normal                 Vagina: normal appearing vagina with normal color and discharge, no lesions              Cervix: {exam; cervix:14595}              Pap taken: {yes no:314532} Bimanual Exam:  Uterus:  {exam; uterus:12215}              Adnexa: {exam; adnexa:12223}               Rectovaginal: Confirms               Anus:  normal sphincter tone, no lesions  Chaperone, ***Terence Lux, CMA, was present for exam.  A:  Well Woman with normal exam  P:   {plan; gyn:5269::"mammogram","pap smear","return annually or prn"}

## 2020-04-06 ENCOUNTER — Ambulatory Visit: Payer: No Typology Code available for payment source | Admitting: Obstetrics & Gynecology

## 2020-04-06 NOTE — Progress Notes (Signed)
49 y.o. G0P0000 Married White or Caucasian female here for annual exam.  Having issues with her right foot and plantar fasciitis.  She has been referred to physical therapy.  She only went to a few visit.    Husband was in a work related accident.  He was out of work for several months and in the hospital for 45 days.  He then had a lot of physical therapy.  He is back at work and is back to close to full time.  He does use a walker for support.  He is 6'2" and >400# so is careful about not falling.  Cycles are regular.  She skipped a few this past year.  Flow is typically heavier for two days and then tapers down to brown.  Bleeding lasts 5 days total.  Patient's last menstrual period was 03/25/2020.          Sexually active: Yes.    The current method of family planning is none.    Exercising: Yes.    walking Smoker:  no  Health Maintenance: Pap:  03-09-18 normal HPV HR neg, 02-01-2019 neg History of abnormal Pap:  yes MMG:  02-13-18 category a density birads 1:neg Colonoscopy:  none BMD:   none TDaP:  unsure Pneumonia vaccine(s):  no Shingrix:   no Hep C testing: neg 08/20/2016 Screening Labs: 02/2020   reports that she has never smoked. She has never used smokeless tobacco. She reports that she does not drink alcohol and does not use drugs.  Past Medical History:  Diagnosis Date  . 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 Anxiety   . Asthma   . Chicken pox   . Depression   . Genital warts   . GERD (gastroesophageal reflux disease)   . History of diverticulitis   . History of genital warts   . Hx: UTI (urinary tract infection)   . Migraines   . Urine incontinence     Past Surgical History:  Procedure Laterality Date  . CERVICAL BIOPSY  W/ LOOP ELECTRODE EXCISION  1996  . CHOLECYSTECTOMY  1997  . HERNIA REPAIR  1976  . TONSILLECTOMY AND ADENOIDECTOMY  1996  . urethral stretching  1980    Current Outpatient Medications  Medication Sig Dispense Refill  .  acetaminophen (TYLENOL) 500 MG tablet Take 500 mg by mouth every 8 (eight) hours as needed.    Marland Kitchen albuterol (VENTOLIN HFA) 108 (90 Base) MCG/ACT inhaler Inhale 2 puffs into the lungs every 6 (six) hours as needed for wheezing or shortness of breath. 8 g 0  . DEXILANT 60 MG capsule Take 1 capsule (60 mg total) by mouth daily as needed. 90 capsule 1  . diclofenac Sodium (VOLTAREN) 1 % GEL Apply 2 g topically 4 (four) times daily. 300 g 0  . fluticasone (FLOVENT HFA) 110 MCG/ACT inhaler Inhale 2 puffs into the lungs 2 (two) times daily. 1 Inhaler 1  . gabapentin (NEURONTIN) 100 MG capsule 2 cap in am 1 po in pm and 1 po qhs (Patient taking differently: 2 cap in am 1 po in pm) 120 capsule 1  . levocetirizine (XYZAL) 5 MG tablet TAKE 1 TABLET BY MOUTH EVERY EVENING AS NEEDED, haven't started yet  5  . rizatriptan (MAXALT-MLT) 10 MG disintegrating tablet DISSOLVE 1 TABLET BY MOUTH AS NEEDED FOR MIGRAINE. MAY REPEAT IN 2 HOURS IF NEEDED 9 tablet 2  . sertraline (ZOLOFT) 100 MG tablet Take 1 tablet (100 mg total)  by mouth daily. 90 tablet 4  . Spacer/Aero-Holding Chambers (AEROCHAMBER PLUS) inhaler Use as instructed to use with inahaler. 1 each 1  . spironolactone (ALDACTONE) 50 MG tablet TAKE 1 TABLET BY MOUTH 2 TIMES DAILY. 180 tablet 1  . AUVI-Q 0.3 MG/0.3ML SOAJ injection  (Patient not taking: Reported on 04/07/2020)    . lidocaine (LIDODERM) 5 % Place 1 patch onto the skin daily. Remove & Discard patch within 12 hours or as directed by MD (Patient not taking: Reported on 04/07/2020) 30 patch 0  . Vitamin D, Ergocalciferol, (DRISDOL) 1.25 MG (50000 UNIT) CAPS capsule Take 1 capsule (50,000 Units total) by mouth every 7 (seven) days for 8 doses. (Patient not taking: Reported on 04/07/2020) 8 capsule 0   No current facility-administered medications for this visit.    Family History  Problem Relation Age of Onset  . Arthritis Mother   . Depression Mother   . Hyperlipidemia Mother   . Miscarriages /  [place] Mother   . Skin cancer Mother   . Early death Father   . Alcohol abuse Maternal Grandmother   . Arthritis Maternal Grandmother   . Depression Maternal Grandmother   . Diabetes Maternal Grandmother   . Hyperlipidemia Maternal Grandmother   . Hypertension Maternal Grandmother   . Kidney disease Maternal Grandmother   . Arthritis Maternal Grandfather   . Hypertension Maternal Grandfather   . Kidney disease Maternal Grandfather   . Heart attack Maternal Grandfather   . Leukemia Maternal Grandfather   . Bone cancer Maternal Grandfather   . Alcohol abuse Paternal Grandmother   . Cancer Paternal Grandmother   . Alcohol abuse Paternal Grandfather     Review of Systems  Constitutional: Negative.   HENT: Negative.   Eyes: Negative.   Respiratory: Negative.   Cardiovascular: Negative.   Gastrointestinal: Negative.   Endocrine: Negative.   Genitourinary: Negative.   Musculoskeletal: Negative.   Skin:       Rash under armpit  Allergic/Immunologic: Negative.   Neurological: Negative.   Hematological: Negative.   Psychiatric/Behavioral: Negative.     Exam:   BP 114/70   Pulse 76   Resp 16   Ht 5' 5.5" (1.664 m)   Wt (!) 312 lb (141.5 kg)   LMP 03/25/2020   BMI 51.13 kg/m   Height: 5' 5.5" (166.4 cm)  General appearance: alert, cooperative and appears stated age Head: Normocephalic, without obvious abnormality, atraumatic Neck: no adenopathy, supple, symmetrical, trachea midline and thyroid normal to inspection and palpation Lungs: clear to auscultation bilaterally Breasts: normal appearance, no masses or tenderness Heart: regular rate and rhythm Abdomen: soft, non-tender; bowel sounds normal; no masses,  no organomegaly Extremities: extremities normal, atraumatic, no cyanosis or edema Skin: Skin color, texture, turgor normal. No rashes or lesions Lymph nodes: Cervical, supraclavicular, and axillary nodes normal. No abnormal inguinal nodes palpated Neurologic:  Grossly normal   Pelvic: External genitalia:  no lesions              Urethra:  normal appearing urethra with no masses, tenderness or lesions              Bartholins and Skenes: normal                 Vagina: normal appearing vagina with normal color and discharge, no lesions              Cervix: no lesions              Pap taken:  No. Bimanual Exam:  Uterus:  normal size, contour, position, consistency, mobility, non-tender              Adnexa: normal adnexa and no mass, fullness, tenderness               Rectovaginal: Confirms               Anus:  normal sphincter tone, no lesions  Chaperone, Terence Lux, CMA, was present for exam.  A:  Well Woman with normal exam H/o PCOS Hirsutism with h/o elevated free testosterone that has normalized with spironolactone  H/o positive stool test for blood in 2020.  Did not see GI. H/o LEEP 1996 with +HR HPV at that time Irregular cycles in past year Obesity with BMI >50  P:   Mammogram guidelines reviewed pap smear neg with neg HR HPV 2019.  (She has desires yearly pap smears but with review of guidelines this year she is ok with not having pap this year.  Repeat pap with HR HPV 2022) Colonoscopy referral placed today RF for Zoloft 100mg , 1.5 tabs daily.  #135/4RF Increase spironolactone to 100mg  bid #180/4RF.  Importance of not getting pregnant reviewed.  [place] and estradiol obtained today return annually or prn

## 2020-04-07 ENCOUNTER — Other Ambulatory Visit: Payer: Self-pay

## 2020-04-07 ENCOUNTER — Ambulatory Visit (INDEPENDENT_AMBULATORY_CARE_PROVIDER_SITE_OTHER): Payer: No Typology Code available for payment source | Admitting: Obstetrics & Gynecology

## 2020-04-07 ENCOUNTER — Encounter: Payer: Self-pay | Admitting: Obstetrics & Gynecology

## 2020-04-07 VITALS — BP 114/70 | HR 76 | Resp 16 | Ht 65.5 in | Wt 312.0 lb

## 2020-04-07 DIAGNOSIS — Z01419 Encounter for gynecological examination (general) (routine) without abnormal findings: Secondary | ICD-10-CM | POA: Diagnosis not present

## 2020-04-07 DIAGNOSIS — N920 Excessive and frequent menstruation with regular cycle: Secondary | ICD-10-CM | POA: Diagnosis not present

## 2020-04-07 DIAGNOSIS — Z1211 Encounter for screening for malignant neoplasm of colon: Secondary | ICD-10-CM | POA: Diagnosis not present

## 2020-04-07 MED ORDER — SPIRONOLACTONE 100 MG PO TABS
50.0000 mg | ORAL_TABLET | Freq: Two times a day (BID) | ORAL | 3 refills | Status: DC
Start: 2020-04-07 — End: 2020-04-11

## 2020-04-07 MED ORDER — SERTRALINE HCL 100 MG PO TABS
150.0000 mg | ORAL_TABLET | Freq: Every day | ORAL | 4 refills | Status: DC
Start: 2020-04-07 — End: 2022-07-17

## 2020-04-07 MED FILL — SERTRALINE HCL 100 MG TAB: 100 | 90 days supply | Qty: 135 | Fill #0

## 2020-04-07 MED FILL — SPIRONOLACTONE 100 MG TAB: 100 | 90 days supply | Qty: 90 | Fill #0

## 2020-04-08 LAB — ESTRADIOL: Estradiol: 178 pg/mL

## 2020-04-08 LAB — FOLLICLE STIMULATING HORMONE: FSH: 6.1 m[IU]/mL

## 2020-04-10 ENCOUNTER — Encounter: Payer: Self-pay | Admitting: Obstetrics & Gynecology

## 2020-04-11 ENCOUNTER — Telehealth: Payer: Self-pay

## 2020-04-11 ENCOUNTER — Other Ambulatory Visit: Payer: Self-pay | Admitting: Obstetrics & Gynecology

## 2020-04-11 MED ORDER — SPIRONOLACTONE 100 MG PO TABS
100.0000 mg | ORAL_TABLET | Freq: Two times a day (BID) | ORAL | 4 refills | Status: DC
Start: 2020-04-11 — End: 2020-04-11

## 2020-04-11 NOTE — Telephone Encounter (Signed)
Per reviewed notes from OV 04/07/20: Increase spironolactone to 100mg  bid #180/4RF.  Importance of not getting pregnant reviewed.  Routing to Dr Sabra Heck, Please advise and clarify Rx.

## 2020-04-11 NOTE — Telephone Encounter (Signed)
Spoke with pt. Pt updated on Rx from Dr Sabra Heck. Pt agreeable and verbalized understanding.  Encounter closed.

## 2020-04-11 NOTE — Telephone Encounter (Signed)
Pt sent following mychart message:  [place], Alexa Clint Lipps, MD 10 hours ago (11:00 PM)   Hello,  I went to pick up the new prescription and the directions were half of a 100mg  tablet BID. I thought we were going to do half to one BID or rather work my way up to 100mg  BID.  Can you double check me on this? If I am going to do 100mg  BID can you call the pharmacy and have them fix the directions and fix the RX to 180 tabs instead of 90?  Thank you!  Alexa West

## 2020-04-11 NOTE — Telephone Encounter (Signed)
Prescription corrected.  Please let her know I did mean to increase this to 100mg  BID and not 1/2 tab.  I am sorry.  Please remind her that she needs to use protection and prevent pregnancy as this can cause birth defects.  Thanks.

## 2020-04-21 ENCOUNTER — Encounter
Payer: No Typology Code available for payment source | Attending: Physical Medicine & Rehabilitation | Admitting: Physical Medicine & Rehabilitation

## 2020-04-21 DIAGNOSIS — M797 Fibromyalgia: Secondary | ICD-10-CM | POA: Insufficient documentation

## 2020-04-21 MED FILL — DEXILANT DR 60 MG CAPSULE: 60 | 30 days supply | Qty: 30 | Fill #1

## 2020-04-22 ENCOUNTER — Encounter: Payer: Self-pay | Admitting: Obstetrics & Gynecology

## 2020-04-24 ENCOUNTER — Encounter: Payer: Self-pay | Admitting: Obstetrics & Gynecology

## 2020-04-24 ENCOUNTER — Telehealth: Payer: Self-pay

## 2020-04-24 ENCOUNTER — Other Ambulatory Visit: Payer: Self-pay | Admitting: Obstetrics & Gynecology

## 2020-04-24 MED ORDER — ONDANSETRON HCL 4 MG PO TABS: 4.0000 mg | ORAL_TABLET | Freq: Three times a day (TID) | ORAL | 0 refills | Status: DC | PRN

## 2020-04-24 MED FILL — ONDANSETRON HCL 4 MG TABLET: 4 | 7 days supply | Qty: 20 | Fill #0

## 2020-04-24 NOTE — Telephone Encounter (Signed)
Return call placed to pt. No answer, VM box full. Will send Mychart message.

## 2020-04-24 NOTE — Telephone Encounter (Signed)
She also increased her spironolactone dosage to 100mg  BID around this time as well.  She should go back to the lower dosage for the time being to see if this is contributing.  Rx for Zofran sent.

## 2020-04-24 NOTE — Telephone Encounter (Signed)
AEX 04/07/20 with SM TSH 1.72- 03/07/20 Hgb A1C 5.7 03/07/20  Spoke with pt. Pt states feeling nauseated, dizzy when waking up the last 2 weeks . Pt states feels on the verge of vomiting, but hasn't. Pt states started having increase of these sx after getting 1st Covid vaccine on 04/07/20. Pt also states having migraines more off and on the last 2 weeks, has taken Maxalt Rx 2 this weekend. Pt states no chance of pregnancy due to husband accident.  Pt had fasting labs with Dr Martinique on 03/07/20. Pt is prediabetic. Was not given referral to prediabetes clinic or endocrinology. Pt states would like to talk to Dr Sabra Heck on getting Free T3, T4 drawn and wanting to discuss about blood sugar issue with possible referrals. Pt states has not discussed with Dr Martinique.  Pt advised to have OV. Pt agreeable. Pt scheduled with Dr Sabra Heck on 04/28/20 at 9 am. Pt verbalized understanding to date and time of appt.   Pt asking for Zofran Rx until OV. Pharmacy verified. Pt advised will review with Dr Sabra Heck.   Routing to Dr Sabra Heck, please advise on Rx for Zofran.

## 2020-04-24 NOTE — Telephone Encounter (Signed)
Spoke with pt. Pt given update and recommendations per Dr Sabra Heck. Pt agreeable and verbalized understanding. Pt states only took 50 mg of Spirolactone  today to see if this helped. Pt thankful for Rx Zofran.  Encounter closed.

## 2020-04-24 NOTE — Telephone Encounter (Signed)
Patient sent the following message via MyChart.  Good evening.  Dr. Martinique did a TSH test sometime in the last month or so. Is there anyway to do a free T3 test?   Thanks

## 2020-04-24 NOTE — Telephone Encounter (Signed)
Patient sent the following message via MyChart.  Good morning!  Would it be possible and if you think this is a good idea..  To call in an RX for a blood meter and strips? About 6 days out of the last two weeks, I have woken up dizzy and nauseated. Today was the same. I was dry heaving some and felt like I was going to pass out. I laid back down, took a shower and came to work. Still don't feel 100% but don't want to call out. A friend suggested that it might be my blood sugar. I do not have a good eating schedule and I am sure that the BS could possibly be an issue.  I did take the last A1C fasting except a little sip of tea. I can take another test if needed.  Plus I have had a runny nose and sneezing with migraines over the last week and a half also.   Thanks!

## 2020-04-25 ENCOUNTER — Telehealth: Payer: Self-pay | Admitting: *Deleted

## 2020-04-25 NOTE — Telephone Encounter (Signed)
Dr. Sabra Heck further reviewed 04/24/20 telephone encounter. Recommends further evaluation with PCP.   Call placed to patient, no answer, unable to leave voicemail.   MyChart message to patient requesting return call.

## 2020-04-26 NOTE — Telephone Encounter (Signed)
MyChart message not read.  Call placed to patient, no answer. Voicemail full, unable to leave message.

## 2020-04-27 NOTE — Telephone Encounter (Signed)
Spoke with patient. Patient is agreeable to f/u with PCP. OV cancelled for 04/28/20. Patient is aware to return call to office to schedule f/u with GYN if needed.   Routing to provider for final review. Patient is agreeable to disposition. Will close encounter.

## 2020-04-28 ENCOUNTER — Ambulatory Visit: Payer: Self-pay | Admitting: Obstetrics & Gynecology

## 2020-05-01 ENCOUNTER — Encounter: Payer: Self-pay | Admitting: Family Medicine

## 2020-05-02 ENCOUNTER — Telehealth: Payer: Self-pay | Admitting: Gastroenterology

## 2020-05-02 MED FILL — RIZATRIPTAN 10 MG ODT: 10 | 30 days supply | Qty: 9 | Fill #1

## 2020-05-02 NOTE — Telephone Encounter (Signed)
Hey Dr Havery Moros this is a former pt of yours but she recently transferred her GI care to Dr Collene Mares within the past year, we have received a referral from Dr Hale Bogus for a colonoscopy, pt would like to know if you would take her back as a pt. Thank you!

## 2020-05-02 NOTE — Telephone Encounter (Signed)
Yes I would be happy to see her again. Thanks

## 2020-05-03 NOTE — Telephone Encounter (Signed)
Patient called back about her message she sent by MyChart.  She was just checking on a response to her message.

## 2020-05-09 ENCOUNTER — Encounter: Payer: Self-pay | Admitting: Family Medicine

## 2020-05-10 ENCOUNTER — Ambulatory Visit: Payer: No Typology Code available for payment source | Admitting: Family Medicine

## 2020-05-10 ENCOUNTER — Other Ambulatory Visit: Payer: Self-pay

## 2020-05-10 DIAGNOSIS — M79671 Pain in right foot: Secondary | ICD-10-CM

## 2020-05-10 DIAGNOSIS — R519 Headache, unspecified: Secondary | ICD-10-CM

## 2020-05-15 MED FILL — SPIRONOLACTONE 100 MG TAB: 100 | 90 days supply | Qty: 180 | Fill #0

## 2020-05-17 ENCOUNTER — Ambulatory Visit (INDEPENDENT_AMBULATORY_CARE_PROVIDER_SITE_OTHER): Payer: No Typology Code available for payment source

## 2020-05-17 ENCOUNTER — Encounter: Payer: Self-pay | Admitting: Family Medicine

## 2020-05-17 ENCOUNTER — Ambulatory Visit (INDEPENDENT_AMBULATORY_CARE_PROVIDER_SITE_OTHER): Payer: No Typology Code available for payment source | Admitting: Family Medicine

## 2020-05-17 ENCOUNTER — Ambulatory Visit: Payer: No Typology Code available for payment source

## 2020-05-17 ENCOUNTER — Other Ambulatory Visit: Payer: Self-pay

## 2020-05-17 ENCOUNTER — Other Ambulatory Visit: Payer: Self-pay | Admitting: Family Medicine

## 2020-05-17 VITALS — BP 128/80 | HR 80 | Resp 16 | Ht 65.5 in | Wt 306.0 lb

## 2020-05-17 DIAGNOSIS — G894 Chronic pain syndrome: Secondary | ICD-10-CM

## 2020-05-17 DIAGNOSIS — G8929 Other chronic pain: Secondary | ICD-10-CM

## 2020-05-17 DIAGNOSIS — M25572 Pain in left ankle and joints of left foot: Secondary | ICD-10-CM

## 2020-05-17 DIAGNOSIS — M25511 Pain in right shoulder: Secondary | ICD-10-CM

## 2020-05-17 DIAGNOSIS — M79671 Pain in right foot: Secondary | ICD-10-CM

## 2020-05-17 DIAGNOSIS — M79672 Pain in left foot: Secondary | ICD-10-CM

## 2020-05-17 DIAGNOSIS — R519 Headache, unspecified: Secondary | ICD-10-CM | POA: Diagnosis not present

## 2020-05-17 DIAGNOSIS — Z0189 Encounter for other specified special examinations: Secondary | ICD-10-CM

## 2020-05-17 DIAGNOSIS — M159 Polyosteoarthritis, unspecified: Secondary | ICD-10-CM

## 2020-05-17 DIAGNOSIS — R5383 Other fatigue: Secondary | ICD-10-CM

## 2020-05-17 DIAGNOSIS — Z789 Other specified health status: Secondary | ICD-10-CM

## 2020-05-17 MED ORDER — CELECOXIB 100 MG PO CAPS: 100.0000 mg | ORAL_CAPSULE | Freq: Two times a day (BID) | ORAL | 0 refills | Status: DC

## 2020-05-17 MED FILL — CELECOXIB 100 MG CAP: 100 | 20 days supply | Qty: 40 | Fill #0

## 2020-05-17 NOTE — Assessment & Plan Note (Signed)
We discussed possible etiologies: Systemic illness, immunologic,endocrinology,sleep disorder, psychiatric/psychologic, infectious,medications side effects, and idiopathic. Examination today does not suggest a serious process. Some of her chronic health issues could be contributing factors.  Healthy diet and regular physical activity as tolerated may help.

## 2020-05-17 NOTE — Progress Notes (Signed)
Chief Complaint  Patient presents with  . Foot Pain   HPI: [name] is a 49 y.o. female, who is here today to address some concerns. Right foot pain for over a year. She usually wears a boot on and off, which has not helped. Pain is daily and intermittent and getting worse.  She follows with orthopedist hand saw podiatrist every months ago.  According to patient, she was referred to pain management. "Excruciating pain", sharp pain last about 10 minutes after rest.  It is along 5th metatarsus,under and behind lateral malleolus. No history of direct trauma that preceded onset of symptoms.  States that a few months before pain is started she had a fall but she was "fine."  Intermittent mild edema. No limitation of range of motion. No erythema or cyanosis. Pain is exacerbated by certain movements, standing up, prolonged walking, and sometimes palpation. Alleviated by rest. Max level of pain "12/10" Occasionally she has some burning and tingling sensation, most of the time with prolonged sitting. Gabapentin 100 mg did not help.  She would like to have ankle and foot imaging done, bilateral.  On 10/01/2018 she had bilateral ankle x-ray. 05/06/2019 she had right foot x-ray.  On 06/30/2019 she had right foot MRI with contrast: 1. High-grade sprain of the distal plantar calcaneocuboid ligament. 2. Soft tissue edema at the dorsolateral aspect of the midfoot. 3. Mild degenerative changes most pronounced at the calcaneocuboid and third tarsometatarsal joints. 4. Lobulated fluid intensity structure emanating from the dorsolateral aspect of the midfoot at the level of the talonavicular joint, which may represent a small ganglion cyst.  She also has history of right foot plantar fasciitis, steroid injection did not help. Shoulders, back, hips, and knee pain.  Left shoulder pain, "not bad." Left shoulder MRI 07/14/2019:Small rim rent tear of the supraspinatus. Adjacent  moderate bone bruising/reactive marrow edema of the greater tuberosity.  She usually takes naproxen or ibuprofen. She has history of GERD, NSAIDs usually exacerbate problem.  Currently she is on Dexilant 60 mg daily. She has followed-up with GI  Yesterday when she was picking up her dog and felt "something" right infraclavicular area. Right shoulder pain with ROM. She has not noted ecchymoses. Negative for cough or dyspnea. Occasional wheezing (history of reactive airway disease).  Headache frequency has increased since her first COVID 19 , [place]. She is afraid of taking the second vaccine.  It is similar to migraine episodes. Frontal pressure headache, and sometimes sharp for short period of time. 8/10. Sometimes nausea and photo and phonophobia. Requesting ab testing.  She takes Maxalt helps most of the time. Headache lasts a couple hours. She has not identified exacerbating factors. She wakes up with headache.  She is also requesting free T3 done today. She has not felt well for a while, "tired all the time." Negative for fever, chills, abnormal weight loss. Sleeps about 8 hours, she wakes up a few times throughout the night.  No known history of OSA but in the past her husband has mentioned that sometimes she does not seem to be breathing.  Lab Results  Component Value Date   TSH 1.72 03/07/2020   Lab Results  Component Value Date   WBC 9.5 03/07/2020   HGB 14.4 03/07/2020   HCT 43.8 03/07/2020   MCV 86.1 03/07/2020   PLT 363 03/07/2020   Lab Results  Component Value Date   CREATININE 0.67 03/07/2020   BUN 8 03/07/2020   NA 136 03/07/2020  K 4.1 03/07/2020   CL 103 03/07/2020   CO2 25 03/07/2020   She is on sertraline 150 mg daily for anxiety.  Review of Systems  Constitutional: Negative for activity change and appetite change.  HENT: Negative for mouth sores, nosebleeds, sore throat and trouble swallowing.   Eyes: Negative for redness and visual  disturbance.  Cardiovascular: Negative for chest pain, palpitations and leg swelling.  Gastrointestinal: Negative for abdominal pain.       Negative for changes in bowel habits.  Genitourinary: Negative for decreased urine volume, dysuria and hematuria.  Skin: Negative for pallor and rash.  Allergic/Immunologic: Positive for environmental allergies.  Neurological: Negative for syncope, facial asymmetry and weakness.  Psychiatric/Behavioral: Positive for sleep disturbance. Negative for confusion. The patient is nervous/anxious.   Rest see pertinent positives and negatives per HPI.  Current Outpatient Medications on File Prior to Visit  Medication Sig Dispense Refill  . acetaminophen (TYLENOL) 500 MG tablet Take 500 mg by mouth every 8 (eight) hours as needed.    Marland Kitchen albuterol (VENTOLIN HFA) 108 (90 Base) MCG/ACT inhaler Inhale 2 puffs into the lungs every 6 (six) hours as needed for wheezing or shortness of breath. 8 g 0  . AUVI-Q 0.3 MG/0.3ML SOAJ injection     . DEXILANT 60 MG capsule Take 1 capsule (60 mg total) by mouth daily as needed. 90 capsule 1  . fluticasone (FLOVENT HFA) 110 MCG/ACT inhaler Inhale 2 puffs into the lungs 2 (two) times daily. 1 Inhaler 1  . gabapentin (NEURONTIN) 100 MG capsule 2 cap in am 1 po in pm and 1 po qhs (Patient taking differently: 2 cap in am 1 po in pm) 120 capsule 1  . levocetirizine (XYZAL) 5 MG tablet TAKE 1 TABLET BY MOUTH EVERY EVENING AS NEEDED, haven't started yet  5  . lidocaine (LIDODERM) 5 % Place 1 patch onto the skin daily. Remove & Discard patch within 12 hours or as directed by MD 30 patch 0  . ondansetron (ZOFRAN) 4 MG tablet Take 1 tablet (4 mg total) by mouth every 8 (eight) hours as needed for nausea or vomiting. 20 tablet 0  . rizatriptan (MAXALT-MLT) 10 MG disintegrating tablet DISSOLVE 1 TABLET BY MOUTH AS NEEDED FOR MIGRAINE. MAY REPEAT IN 2 HOURS IF NEEDED 9 tablet 2  . sertraline (ZOLOFT) 100 MG tablet Take 1.5 tablets (150 mg total)  by mouth daily. 135 tablet 4  . Spacer/Aero-Holding Chambers (AEROCHAMBER PLUS) inhaler Use as instructed to use with inahaler. 1 each 1  . spironolactone (ALDACTONE) 100 MG tablet Take 1 tablet (100 mg total) by mouth 2 (two) times daily. 180 tablet 4   No current facility-administered medications on file prior to visit.     Past Medical History:  Diagnosis Date  . 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 Anxiety   . Asthma   . Chicken pox   . Depression   . Genital warts   . GERD (gastroesophageal reflux disease)   . History of diverticulitis   . History of genital warts   . Hx: UTI (urinary tract infection)   . Migraines   . Urine incontinence    Allergies  Allergen Reactions  . Cashew Nut Oil   . Milk-Related Compounds Diarrhea  . Wheat Bran   . Cephalosporins Hives and Rash  . Penicillins Hives and Rash    Social History   Socioeconomic History  . Marital status: Married    Spouse name: Not  on file  . Number of children: 0  . Years of education: Not on file  . Highest education level: Not on file  Occupational History  . Not on file  Tobacco Use  . Smoking status: Never Smoker  . Smokeless tobacco: Never Used  Vaping Use  . Vaping Use: Never used  Substance and Sexual Activity  . Alcohol use: No  . Drug use: No  . Sexual activity: Yes    Partners: Male  Other Topics Concern  . Not on file  Social History Narrative  . Not on file   Social Determinants of Health   Financial Resource Strain:   . Difficulty of Paying Living Expenses: Not on file  Food Insecurity:   . Worried About Charity fundraiser in the Last Year: Not on file  . Ran Out of Food in the Last Year: Not on file  Transportation Needs:   . Lack of Transportation (Medical): Not on file  . Lack of Transportation (Non-Medical): Not on file  Physical Activity:   . Days of Exercise per Week: Not on file  . Minutes of Exercise per Session: Not on file  Stress:   . Feeling of Stress  : Not on file  Social Connections:   . Frequency of Communication with Friends and Family: Not on file  . Frequency of Social Gatherings with Friends and Family: Not on file  . Attends Religious Services: Not on file  . Active Member of Clubs or Organizations: Not on file  . Attends Archivist Meetings: Not on file  . Marital Status: Not on file    Vitals:   05/17/20 0731  BP: 128/80  Pulse: 80  Resp: 16  SpO2: 94%   Body mass index is 50.15 kg/m.  Physical Exam Vitals and nursing note reviewed.  Constitutional:      General: She is not in acute distress.    Appearance: She is well-developed.  HENT:     Head: Normocephalic and atraumatic.     Mouth/Throat:     Mouth: Mucous membranes are moist.     Pharynx: Oropharynx is clear.  Eyes:     Conjunctiva/sclera: Conjunctivae normal.     Pupils: Pupils are equal, round, and reactive to light.  Cardiovascular:     Rate and Rhythm: Normal rate and regular rhythm.     Pulses:          Dorsalis pedis pulses are 2+ on the right side and 2+ on the left side.  Pulmonary:     Effort: Pulmonary effort is normal. No respiratory distress.     Breath sounds: Normal breath sounds.  Abdominal:     Palpations: Abdomen is soft. There is no hepatomegaly or mass.     Tenderness: There is no abdominal tenderness.  Musculoskeletal:     Right ankle: No swelling or deformity. Normal range of motion. Normal pulse.     Right Achilles Tendon: No tenderness.     Left ankle: Decreased range of motion (mild, flexion). Normal pulse.     Left Achilles Tendon: No tenderness.       Feet:     Comments: Mild edema, not pitting around right lateral malleolus and proximal lateral dorsal aspect of foot. Today pain is not elicited with exam. Dorsi-flexion causes mild pain.  Lymphadenopathy:     Cervical: No cervical adenopathy.  Skin:    General: Skin is warm.     Findings: No erythema or rash.  Neurological:  General: No focal deficit  present.     Mental Status: She is alert and oriented to person, place, and time.     Cranial Nerves: No cranial nerve deficit.     Gait: Gait normal.  Psychiatric:        Mood and Affect: Mood is anxious.    ASSESSMENT AND PLAN:  [name] was seen today for foot pain.  Diagnoses and all orders for this visit:  Orders Placed This Encounter  Procedures  . DG Shoulder Right  . DG Clavicle Right  . DG Ankle Complete Left  . DG Foot Complete Left  . SAR CoV2 Serology (COVID 19)AB(IGG)IA  . T3, free  . SARS-CoV-2 Antibody(IgG)Spike,Semi-Quantitative  . Nocturnal polysomnography    Generalized osteoarthritis of multiple sites After discussion of some side effects she agrees we will try Celebrex 100 mg twice daily. If she tolerates well, we could increase dose of Celebrex from 100 mg to 200 mg twice daily. Weight loss will also help.  Fatigue We discussed possible etiologies: Systemic illness, immunologic,endocrinology,sleep disorder, psychiatric/psychologic, infectious,medications side effects, and idiopathic. Examination today does not suggest a serious process. Some of her chronic health issues could be contributing factors.  Healthy diet and regular physical activity as tolerated may help.   Chronic pain disorder We discussed current guidelines in regard to opioids to manage chronic pain. For now she will start with Celebrex 100 mg twice daily. Follow-up in 4 weeks.   Chronic pain of left ankle Has followed with ortho and podiatrist. X ray ordered as requested.  Left foot pain -     DG Foot Complete Left; Future  Right foot pain Foot MRI showed mild degenerative changes and calcaneocuboid high grade sprain. Right foot X ray ordered.   Morning headache Possible causes discussed. ? Tension headache. ? OSA also to be considered.  Acute pain of right shoulder ROM exercises. Further recommendations according to X ary result.  Arthralgia of right  acromioclavicular joint ? OA. Monitor for new symptoms. X ray ordered.  Unknown status of immunity to COVID-19 virus -     SARS-CoV-2 Antibody(IgG)Spike,Semi-Quantitative  Patient request for diagnostic testing -     T3, free -     SARS-CoV-2 Antibody(IgG)Spike,Semi-Quantitative   Spent 43 minutes with pt.  During this time history was obtained and documented, examination was performed, prior labs/imaging reviewed, and assessment/plan discussed.   Return in about 4 weeks (around 06/14/2020) for Pain.   Alexa Perham G. Martinique, MD  [facility]. [place] office.   A few things to remember from today's visit:   Chronic pain of left ankle - Plan: DG Ankle Complete Left  Left foot pain - Plan: DG Foot Complete Left  Right foot pain  Morning headache - Plan: Nocturnal polysomnography  Acute pain of right shoulder - Plan: DG Shoulder Right  Arthralgia of right acromioclavicular joint - Plan: DG Clavicle Right  Let's start with Celebrex 100 mg 2 times daily. We need to monitor blood pressure and renal function periodically. We could try higher doses of needed and/or try Hydrocodone.  If you need refills please call your pharmacy. Do not use My Chart to request refills or for acute issues that need immediate attention.    Please be sure medication list is accurate. If a new problem present, please set up appointment sooner than planned today.

## 2020-05-17 NOTE — Assessment & Plan Note (Signed)
We discussed current guidelines in regard to opioids to manage chronic pain. For now she will start with Celebrex 100 mg twice daily. Follow-up in 4 weeks.

## 2020-05-17 NOTE — Assessment & Plan Note (Signed)
After discussion of some side effects she agrees we will try Celebrex 100 mg twice daily. If she tolerates well, we could increase dose of Celebrex from 100 mg to 200 mg twice daily. Weight loss will also help.

## 2020-05-17 NOTE — Addendum Note (Signed)
Addended by: Rodrigo Ran on: 05/17/2020 08:20 AM   Modules accepted: Orders

## 2020-05-17 NOTE — Patient Instructions (Signed)
A few things to remember from today's visit:   Chronic pain of left ankle - Plan: DG Ankle Complete Left  Left foot pain - Plan: DG Foot Complete Left  Right foot pain  Morning headache - Plan: Nocturnal polysomnography  Acute pain of right shoulder - Plan: DG Shoulder Right  Arthralgia of right acromioclavicular joint - Plan: DG Clavicle Right  Let's start with Celebrex 100 mg 2 times daily. We need to monitor blood pressure and renal function periodically. We could try higher doses of needed and/or try Hydrocodone.  If you need refills please call your pharmacy. Do not use My Chart to request refills or for acute issues that need immediate attention.    Please be sure medication list is accurate. If a new problem present, please set up appointment sooner than planned today.

## 2020-05-18 ENCOUNTER — Encounter: Payer: Self-pay | Admitting: Family Medicine

## 2020-05-18 LAB — SARS-COV-2 ANTIBODY(IGG)SPIKE,SEMI-QUANTITATIVE: SARS COV1 AB(IGG)SPIKE,SEMI QN: <1 index (ref <1.00)

## 2020-05-18 LAB — T3, FREE: T3, Free: 3.8 pg/mL (ref 2.3–4.2)

## 2020-05-18 LAB — T3: T3, Total: 151 ng/dL (ref 76–181)

## 2020-05-19 ENCOUNTER — Encounter: Payer: No Typology Code available for payment source | Admitting: Family Medicine

## 2020-05-21 ENCOUNTER — Encounter: Payer: Self-pay | Admitting: Family Medicine

## 2020-05-23 MED FILL — DEXILANT DR 60 MG CAPSULE: 60 | 30 days supply | Qty: 30 | Fill #2

## 2020-05-24 ENCOUNTER — Telehealth: Payer: Self-pay | Admitting: Family Medicine

## 2020-05-24 ENCOUNTER — Other Ambulatory Visit: Payer: Self-pay

## 2020-05-24 ENCOUNTER — Encounter: Payer: Self-pay | Admitting: Family Medicine

## 2020-05-24 ENCOUNTER — Emergency Department (INDEPENDENT_AMBULATORY_CARE_PROVIDER_SITE_OTHER): Payer: No Typology Code available for payment source

## 2020-05-24 ENCOUNTER — Emergency Department: Admit: 2020-05-24 | Discharge status: Absent without leave | Payer: Self-pay

## 2020-05-24 ENCOUNTER — Emergency Department
Admission: EM | Admit: 2020-05-24 | Discharge: 2020-05-24 | Disposition: A | Discharge status: Home / Self Care | Payer: No Typology Code available for payment source | Source: Home / Self Care | Attending: Family Medicine | Admitting: Family Medicine

## 2020-05-24 DIAGNOSIS — R509 Fever, unspecified: Secondary | ICD-10-CM | POA: Diagnosis not present

## 2020-05-24 DIAGNOSIS — R062 Wheezing: Secondary | ICD-10-CM

## 2020-05-24 DIAGNOSIS — R059 Cough, unspecified: Secondary | ICD-10-CM | POA: Diagnosis not present

## 2020-05-24 DIAGNOSIS — J4521 Mild intermittent asthma with (acute) exacerbation: Secondary | ICD-10-CM | POA: Diagnosis not present

## 2020-05-24 LAB — POCT CBC W AUTO DIFF (K'VILLE URGENT CARE)

## 2020-05-24 MED ORDER — DOXYCYCLINE HYCLATE 100 MG PO CAPS: ORAL_CAPSULE | ORAL | 0 refills | Status: DC

## 2020-05-24 MED ORDER — PREDNISONE 20 MG PO TABS: ORAL_TABLET | ORAL | 0 refills | Status: DC

## 2020-05-24 MED ORDER — GUAIFENESIN-CODEINE 100-10 MG/5ML PO SOLN
ORAL | 0 refills | Status: DC
Start: 2020-05-24 — End: 2021-04-04

## 2020-05-24 NOTE — ED Triage Notes (Signed)
Patient presents to Urgent Care with complaints of migraine, cough, congestion, wheezing since about 4 weeks, worse recnetly. Patient reports her symptoms started first after having her first covid shot in September. Pt had antibody test done at PCP, requested a chest x-ray but has not heard back from her PCP yet. Pt has been taking mucinex intermittently, has been using her inhalers as well. Pt in NAD at this time.

## 2020-05-24 NOTE — Telephone Encounter (Signed)
Called patient back, mailbox is full and could not leave a message. Patient was calling to try to get a in-person visit today and provider schedule is full. Dr. Martinique recommendation for the pt is the go to Urgent care due to the symptoms pt is having and since Dr. Martinique is off tomorrow she will not be able to see her in a timely manner.

## 2020-05-24 NOTE — ED Provider Notes (Signed)
[facility]    CSN: [id number] Arrival date & time: 05/24/20  1816      History   Chief Complaint Chief Complaint  Patient presents with  . Migraine  . Cough    HPI Alexa West is a 49 y.o. female.   Patient states that she developed a URI about a month ago, and has had persistent headache and fatigue.  During the past week she has developed increased wheezing with onset of chills today.  She also complains of right earache and facial pressure today.  Patient had an antibody test done at PCP office, but has not yet received the result. She had her first COVID injection in September.   She has a past history of pneumonia about 10 years ago.  The history is provided by the patient.    Past Medical History:  Diagnosis Date  . 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 Anxiety   . Asthma   . Chicken pox   . Depression   . Genital warts   . GERD (gastroesophageal reflux disease)   . History of diverticulitis   . History of genital warts   . Hx: UTI (urinary tract infection)   . Migraines   . Urine incontinence     Patient Active Problem List   Diagnosis Date Noted  . Generalized osteoarthritis of multiple sites 05/17/2020  . Fatigue 05/17/2020  . Chronic pain disorder 05/17/2020  . Primary osteoarthritis of left hip 03/03/2020  . Right knee pain 10/27/2018  . Neck pain 05/13/2018  . Body mass index 50.0-59.9, adult ([place]) 05/13/2018  . PCOS (polycystic ovarian syndrome) 12/05/2017  . History of IBS 12/05/2017  . S/P cholecystectomy 12/05/2017  . Gastric ulcer with hemorrhage 12/05/2017  . Eosinophilic esophagitis 75/91/6384  . Morbid obesity ([place]) 10/13/2017  . Migraine headache without aura 10/13/2017  . GI bleed 08/20/2016  . Transaminitis 08/20/2016  . Prediabetes 08/20/2016  . Lumbar pain 05/24/2014  . Impaired fasting glucose 09/18/2010  . Depression 03/04/2008  . Esophageal reflux 03/04/2008    Past Surgical History:    Procedure Laterality Date  . CERVICAL BIOPSY  W/ LOOP ELECTRODE EXCISION  1996  . CHOLECYSTECTOMY  1997  . HERNIA REPAIR  1976  . TONSILLECTOMY AND ADENOIDECTOMY  1996  . urethral stretching  1980    OB History    Gravida  0   Para  0   Term  0   Preterm  0   AB  0   Living  0     SAB  0   TAB  0   Ectopic  0   Multiple  0   Live Births  0            Home Medications    Prior to Admission medications   Medication Sig Start Date End Date Taking? Authorizing Provider  acetaminophen (TYLENOL) 500 MG tablet Take 500 mg by mouth every 8 (eight) hours as needed.    [provider]  albuterol (VENTOLIN HFA) 108 (90 Base) MCG/ACT inhaler Inhale 2 puffs into the lungs every 6 (six) hours as needed for wheezing or shortness of breath. 08/08/19   Sharion Balloon, FNP  AUVI-Q 0.3 MG/0.3ML SOAJ injection  01/27/18   [provider]  celecoxib (CELEBREX) 100 MG capsule Take 1 capsule (100 mg total) by mouth 2 (two) times daily. 05/17/20   [place], Betty G, MD  DEXILANT 60 MG capsule Take 1 capsule (  60 mg total) by mouth daily as needed. 03/03/20   [place], Betty G, MD  doxycycline (VIBRAMYCIN) 100 MG capsule Take one cap PO Q12hr with food. 05/24/20   Kandra Nicolas, MD  fluticasone (FLOVENT HFA) 110 MCG/ACT inhaler Inhale 2 puffs into the lungs 2 (two) times daily. 09/17/19   [place], Betty G, MD  gabapentin (NEURONTIN) 100 MG capsule 2 cap in am 1 po in pm and 1 po qhs Patient taking differently: 2 cap in am 1 po in pm 02/15/20   Kirsteins, Luanna Salk, MD  guaiFENesin-codeine 100-10 MG/5ML syrup Take 79mL by mouth at bedtime as needed for cough. 05/24/20   Kandra Nicolas, MD  levocetirizine (XYZAL) 5 MG tablet TAKE 1 TABLET BY MOUTH EVERY EVENING AS NEEDED, haven't started yet 01/15/18   [provider]  lidocaine (LIDODERM) 5 % Place 1 patch onto the skin daily. Remove & Discard patch within 12 hours or as directed by MD 05/09/18   Caccavale, Sophia,  PA-C  ondansetron (ZOFRAN) 4 MG tablet Take 1 tablet (4 mg total) by mouth every 8 (eight) hours as needed for nausea or vomiting. 04/24/20   Megan Salon, MD  predniSONE (DELTASONE) 20 MG tablet Take one tab by mouth twice daily for 4 days, then one daily. Take with food. 05/24/20   Kandra Nicolas, MD  rizatriptan (MAXALT-MLT) 10 MG disintegrating tablet DISSOLVE 1 TABLET BY MOUTH AS NEEDED FOR MIGRAINE. MAY REPEAT IN 2 HOURS IF NEEDED 02/18/20   [place], Betty G, MD  sertraline (ZOLOFT) 100 MG tablet Take 1.5 tablets (150 mg total) by mouth daily. 04/07/20   Megan Salon, MD  Spacer/Aero-Holding Chambers (AEROCHAMBER PLUS) inhaler Use as instructed to use with inahaler. 09/17/19   [place], Betty G, MD  spironolactone (ALDACTONE) 100 MG tablet Take 1 tablet (100 mg total) by mouth 2 (two) times daily. 04/11/20   Megan Salon, MD    Family History Family History  Problem Relation Age of Onset  . Arthritis Mother   . Depression Mother   . Hyperlipidemia Mother   . Miscarriages / [place] Mother   . Skin cancer Mother   . Early death Father   . Alcohol abuse Maternal Grandmother   . Arthritis Maternal Grandmother   . Depression Maternal Grandmother   . Diabetes Maternal Grandmother   . Hyperlipidemia Maternal Grandmother   . Hypertension Maternal Grandmother   . Kidney disease Maternal Grandmother   . Arthritis Maternal Grandfather   . Hypertension Maternal Grandfather   . Kidney disease Maternal Grandfather   . Heart attack Maternal Grandfather   . Leukemia Maternal Grandfather   . Bone cancer Maternal Grandfather   . Alcohol abuse Paternal Grandmother   . Cancer Paternal Grandmother   . Alcohol abuse Paternal Grandfather     Social History Social History   Tobacco Use  . Smoking status: Never Smoker  . Smokeless tobacco: Never Used  Vaping Use  . Vaping Use: Never used  Substance Use Topics  . Alcohol use: No  . Drug use: No     Allergies   Cashew nut oil,  Milk-related compounds, Wheat bran, Cephalosporins, and Penicillins   Review of Systems Review of Systems  No sore throat + cough No pleuritic pain + wheezing + nasal congestion + post-nasal drainage + sinus pain/pressure No itchy/red eyes ? right earache No hemoptysis No SOB No fever, + chills + nausea No vomiting No abdominal pain No diarrhea No urinary symptoms No skin rash +  fatigue No myalgias + headache Used OTC meds (Zyrtec and Mucinex) without relief    Physical Exam Triage Vital Signs ED Triage Vitals  Enc Vitals Group     BP 05/24/20 1854 139/88     Pulse Rate 05/24/20 1854 77     Resp 05/24/20 1854 16     Temp 05/24/20 1854 99.2 F (37.3 C)     Temp Source 05/24/20 1854 Oral     SpO2 05/24/20 1854 97 %     Weight --      Height --      Head Circumference --      Peak Flow --      Pain Score 05/24/20 1851 8     Pain Loc --      Pain Edu? --      Excl. in [place]? --    No data found.  Updated Vital Signs BP 139/88 (BP Location: Right Arm)   Pulse 77   Temp 99.2 F (37.3 C) (Oral)   Resp 16   LMP 05/15/2020   SpO2 97%   Visual Acuity Right Eye Distance:   Left Eye Distance:   Bilateral Distance:    Right Eye Near:   Left Eye Near:    Bilateral Near:     Physical Exam Nursing notes and Vital Signs reviewed. Appearance:  Patient appears stated age, and in no acute distress.  She is alert and oriented.  Eyes:  Pupils are equal, round, and reactive to light and accomodation.  Extraocular movement is intact.  Conjunctivae are not inflamed  Ears:  Canals normal.  Tympanic membranes normal.  Nose:  Mildly congested turbinates.  No sinus tenderness.  Pharynx:  Normal Neck:  Supple.  Mildly enlarged lateral nodes are present, tender to palpation on the left.   Lungs:   Faint rhonchi right chest.  Breath sounds are equal.  Moving air well. Heart:  Regular rate and rhythm without murmurs, rubs, or gallops.  Abdomen:  Nontender without masses or  hepatosplenomegaly.  Bowel sounds are present.  No CVA or flank tenderness.  Extremities:  No edema.  Skin:  No rash present.   UC Treatments / Results  Labs (all labs ordered are listed, but only abnormal results are displayed) Labs Reviewed  POCT CBC W AUTO DIFF ([facility]):  WBC 10.2; LY 40.0; MO 4.9; GR 55.1; Hgb 14.3; Platelets 327     EKG   Radiology DG Chest 2 View  Result Date: 05/24/2020 CLINICAL DATA:  Persistent cough for 1 month. Recent onset of fever. Congestion and wheezing. EXAM: CHEST - 2 VIEW COMPARISON:  10/01/2019 FINDINGS: The cardiomediastinal contours are normal. Peribronchial thickening which is increased from prior exam. Pulmonary vasculature is normal. No consolidation, pleural effusion, or pneumothorax. No acute osseous abnormalities are seen. Thoracic spondylosis. IMPRESSION: Peribronchial thickening, can be seen with bronchitis or asthma. No focal airspace disease or pneumonia. Electronically Signed   By: Keith Rake M.D.   On: 05/24/2020 20:02    Procedures Procedures (including critical care time)  Medications Ordered in UC Medications - No data to display  Initial Impression / Assessment and Plan / UC Course  I have reviewed the triage vital signs and the nursing notes.  Pertinent labs & imaging results that were available during my care of the patient were reviewed by me and considered in my medical decision making (see chart for details).    Normal WBC reassuring, but chest x-ray shows increased peribronchial thickening. Begin doxycycline and  prednisone burst/taper.   Rx for Robitussin AC for night time cough.  Followup with Family Doctor if not improved in one week.   Review of previous records reveals vitamin D deficiency (vitamin D, 25-OH 18 ng/mL on 03/27/20) without followup.  Advised patient to follow-up with her PCP for management to improve her immune response.  Final Clinical Impressions(s) / UC Diagnoses   Final  diagnoses:  Mild intermittent asthmatic bronchitis with acute exacerbation     Discharge Instructions     Take plain guaifenesin (1200mg  extended release tabs such as Mucinex) twice daily, with plenty of water, for cough and congestion.  May add Pseudoephedrine (30mg , one or two every 4 to 6 hours) for sinus congestion.  Get adequate rest.   May use Afrin nasal spray (or generic oxymetazoline) each morning for about 5 days and then discontinue.  Also recommend using saline nasal spray several times daily and saline nasal irrigation (AYR is a common brand).  Use Flonase nasal spray each morning after using Afrin nasal spray and saline nasal irrigation. Try warm salt water gargles for sore throat.  Stop all antihistamines for now, and other non-prescription cough/cold preparations. Continue albuterol inhaler as needed.      ED Prescriptions    Medication Sig Dispense Auth. Provider   doxycycline (VIBRAMYCIN) 100 MG capsule Take one cap PO Q12hr with food. 20 capsule Kandra Nicolas, MD   predniSONE (DELTASONE) 20 MG tablet Take one tab by mouth twice daily for 4 days, then one daily. Take with food. 12 tablet Kandra Nicolas, MD   guaiFENesin-codeine 100-10 MG/5ML syrup Take 60mL by mouth at bedtime as needed for cough. 50 mL Kandra Nicolas, MD        Kandra Nicolas, MD 05/27/20 [phone]

## 2020-05-24 NOTE — Discharge Instructions (Addendum)
Take plain guaifenesin (1200mg  extended release tabs such as Mucinex) twice daily, with plenty of water, for cough and congestion.  May add Pseudoephedrine (30mg , one or two every 4 to 6 hours) for sinus congestion.  Get adequate rest.   May use Afrin nasal spray (or generic oxymetazoline) each morning for about 5 days and then discontinue.  Also recommend using saline nasal spray several times daily and saline nasal irrigation (AYR is a common brand).  Use Flonase nasal spray each morning after using Afrin nasal spray and saline nasal irrigation. Try warm salt water gargles for sore throat.  Stop all antihistamines for now, and other non-prescription cough/cold preparations. Continue albuterol inhaler as needed.

## 2020-06-13 ENCOUNTER — Ambulatory Visit: Payer: Self-pay | Admitting: Family Medicine

## 2020-09-13 ENCOUNTER — Other Ambulatory Visit: Payer: Self-pay

## 2020-09-13 ENCOUNTER — Encounter: Payer: Self-pay | Admitting: Family Medicine

## 2020-10-23 ENCOUNTER — Other Ambulatory Visit: Payer: Self-pay

## 2020-10-23 ENCOUNTER — Ambulatory Visit (HOSPITAL_COMMUNITY)
Admission: EM | Admit: 2020-10-23 | Discharge: 2020-10-23 | Disposition: A | Discharge status: Home / Self Care | Payer: BC Managed Care – PPO | Attending: Urgent Care | Admitting: Urgent Care

## 2020-10-23 ENCOUNTER — Encounter (HOSPITAL_COMMUNITY): Payer: Self-pay | Admitting: Emergency Medicine

## 2020-10-23 ENCOUNTER — Ambulatory Visit (INDEPENDENT_AMBULATORY_CARE_PROVIDER_SITE_OTHER): Payer: BC Managed Care – PPO

## 2020-10-23 DIAGNOSIS — R062 Wheezing: Secondary | ICD-10-CM

## 2020-10-23 DIAGNOSIS — J4541 Moderate persistent asthma with (acute) exacerbation: Secondary | ICD-10-CM

## 2020-10-23 DIAGNOSIS — Z20822 Contact with and (suspected) exposure to covid-19: Secondary | ICD-10-CM | POA: Diagnosis not present

## 2020-10-23 DIAGNOSIS — J4540 Moderate persistent asthma, uncomplicated: Secondary | ICD-10-CM

## 2020-10-23 DIAGNOSIS — R059 Cough, unspecified: Secondary | ICD-10-CM

## 2020-10-23 DIAGNOSIS — R0989 Other specified symptoms and signs involving the circulatory and respiratory systems: Secondary | ICD-10-CM | POA: Diagnosis not present

## 2020-10-23 MED ORDER — METHYLPREDNISOLONE ACETATE 80 MG/ML IJ SUSP
INTRAMUSCULAR | Status: AC
  Filled 2020-10-23: qty 1

## 2020-10-23 MED ORDER — METHYLPREDNISOLONE ACETATE 80 MG/ML IJ SUSP
80.0000 mg | Freq: Once | INTRAMUSCULAR | Status: AC
  Administered 2020-10-23: 80 mg via INTRAMUSCULAR

## 2020-10-23 MED ORDER — ALBUTEROL SULFATE HFA 108 (90 BASE) MCG/ACT IN AERS: 2.0000 | INHALATION_SPRAY | Freq: Four times a day (QID) | RESPIRATORY_TRACT | 0 refills | Status: DC | PRN

## 2020-10-23 MED ORDER — PROMETHAZINE-DM 6.25-15 MG/5ML PO SYRP: 5.0000 mL | ORAL_SOLUTION | Freq: Every evening | ORAL | 0 refills | Status: DC | PRN

## 2020-10-23 MED ORDER — BENZONATATE 100 MG PO CAPS
100.0000 mg | ORAL_CAPSULE | Freq: Three times a day (TID) | ORAL | 0 refills | Status: DC | PRN
Start: 2020-10-23 — End: 2021-04-04

## 2020-10-23 NOTE — ED Provider Notes (Signed)
[place]   MRN: [id number] DOB: 14-Jul-1971  Subjective:   Alexa West is a 50 y.o. female presenting for 85-month history of persistent hacking productive cough with shortness of breath, chest congestion and wheezing, throat pain.  Reports that she has had a slight metallic taste in her mouth lately after coughing.  Patient has a history of asthma and has been using her inhaler daily, sometimes multiple times a day.  She initially had an office visit in [place] for the same symptoms.  Chest x-ray was done and patient was ultimately prescribed doxycycline and an oral prednisone course.  She states this helped very temporarily but has consistently had symptoms throughout this time.  She does have a PCP but they have refused to see her due to having a "Covid-like symptoms".  Patient has also had intermittent right ear pain.  She is not a smoker.  She has prediabetes.  No current facility-administered medications for this encounter.  Current Outpatient Medications:  .  acetaminophen (TYLENOL) 500 MG tablet, Take 500 mg by mouth every 8 (eight) hours as needed., Disp: , Rfl:  .  albuterol (VENTOLIN HFA) 108 (90 Base) MCG/ACT inhaler, Inhale 2 puffs into the lungs every 6 (six) hours as needed for wheezing or shortness of breath., Disp: 8 g, Rfl: 0 .  AUVI-Q 0.3 MG/0.3ML SOAJ injection, , Disp: , Rfl:  .  celecoxib (CELEBREX) 100 MG capsule, Take 1 capsule (100 mg total) by mouth 2 (two) times daily., Disp: 40 capsule, Rfl: 0 .  DEXILANT 60 MG capsule, Take 1 capsule (60 mg total) by mouth daily as needed., Disp: 90 capsule, Rfl: 1 .  doxycycline (VIBRAMYCIN) 100 MG capsule, Take one cap PO Q12hr with food., Disp: 20 capsule, Rfl: 0 .  fluticasone (FLOVENT HFA) 110 MCG/ACT inhaler, Inhale 2 puffs into the lungs 2 (two) times daily., Disp: 1 Inhaler, Rfl: 1 .  gabapentin (NEURONTIN) 100 MG capsule, 2 cap in am 1 po in pm and 1 po qhs (Patient taking differently: 2 cap  in am 1 po in pm), Disp: 120 capsule, Rfl: 1 .  guaiFENesin-codeine 100-10 MG/5ML syrup, Take 43mL by mouth at bedtime as needed for cough., Disp: 50 mL, Rfl: 0 .  levocetirizine (XYZAL) 5 MG tablet, TAKE 1 TABLET BY MOUTH EVERY EVENING AS NEEDED, haven't started yet, Disp: , Rfl: 5 .  lidocaine (LIDODERM) 5 %, Place 1 patch onto the skin daily. Remove & Discard patch within 12 hours or as directed by MD, Disp: 30 patch, Rfl: 0 .  ondansetron (ZOFRAN) 4 MG tablet, Take 1 tablet (4 mg total) by mouth every 8 (eight) hours as needed for nausea or vomiting., Disp: 20 tablet, Rfl: 0 .  rizatriptan (MAXALT-MLT) 10 MG disintegrating tablet, DISSOLVE 1 TABLET BY MOUTH AS NEEDED FOR MIGRAINE. MAY REPEAT IN 2 HOURS IF NEEDED, Disp: 9 tablet, Rfl: 2 .  sertraline (ZOLOFT) 100 MG tablet, Take 1.5 tablets (150 mg total) by mouth daily., Disp: 135 tablet, Rfl: 4 .  Spacer/Aero-Holding Chambers (AEROCHAMBER PLUS) inhaler, Use as instructed to use with inahaler., Disp: 1 each, Rfl: 1 .  spironolactone (ALDACTONE) 100 MG tablet, Take 1 tablet (100 mg total) by mouth 2 (two) times daily., Disp: 180 tablet, Rfl: 4   Allergies  Allergen Reactions  . Cashew Nut Oil   . Milk-Related Compounds Diarrhea  . Wheat Bran   . Cephalosporins Hives and Rash  . Penicillins Hives and Rash    Past Medical  History:  Diagnosis Date  . 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 Anxiety   . Asthma   . Chicken pox   . Depression   . Genital warts   . GERD (gastroesophageal reflux disease)   . History of diverticulitis   . History of genital warts   . Hx: UTI (urinary tract infection)   . Migraines   . Urine incontinence      Past Surgical History:  Procedure Laterality Date  . CERVICAL BIOPSY  W/ LOOP ELECTRODE EXCISION  1996  . CHOLECYSTECTOMY  1997  . HERNIA REPAIR  1976  . TONSILLECTOMY AND ADENOIDECTOMY  1996  . urethral stretching  1980    Family History  Problem Relation Age of Onset  . Arthritis  Mother   . Depression Mother   . Hyperlipidemia Mother   . Miscarriages / [place] Mother   . Skin cancer Mother   . Early death Father   . Alcohol abuse Maternal Grandmother   . Arthritis Maternal Grandmother   . Depression Maternal Grandmother   . Diabetes Maternal Grandmother   . Hyperlipidemia Maternal Grandmother   . Hypertension Maternal Grandmother   . Kidney disease Maternal Grandmother   . Arthritis Maternal Grandfather   . Hypertension Maternal Grandfather   . Kidney disease Maternal Grandfather   . Heart attack Maternal Grandfather   . Leukemia Maternal Grandfather   . Bone cancer Maternal Grandfather   . Alcohol abuse Paternal Grandmother   . Cancer Paternal Grandmother   . Alcohol abuse Paternal Grandfather     Social History   Tobacco Use  . Smoking status: Never Smoker  . Smokeless tobacco: Never Used  Vaping Use  . Vaping Use: Never used  Substance Use Topics  . Alcohol use: No  . Drug use: No    ROS   Objective:   Vitals: BP (!) 159/87 (BP Location: Right Arm)   Pulse 83   Temp 99 F (37.2 C) (Oral)   Resp 19   LMP 10/02/2020   SpO2 98%   Physical Exam Constitutional:      General: She is not in acute distress.    Appearance: Normal appearance. She is well-developed. She is obese. She is not ill-appearing, toxic-appearing or diaphoretic.  HENT:     Head: Normocephalic and atraumatic.     Nose: Nose normal.     Mouth/Throat:     Mouth: Mucous membranes are moist.  Eyes:     Extraocular Movements: Extraocular movements intact.     Pupils: Pupils are equal, round, and reactive to light.  Cardiovascular:     Rate and Rhythm: Normal rate and regular rhythm.     Pulses: Normal pulses.     Heart sounds: Normal heart sounds. No murmur heard. No friction rub. No gallop.   Pulmonary:     Effort: Pulmonary effort is normal. No respiratory distress.     Breath sounds: No stridor. Wheezing present. No rhonchi or rales.  Skin:    General:  Skin is warm and dry.     Findings: No rash.  Neurological:     Mental Status: She is alert and oriented to person, place, and time.  Psychiatric:        Mood and Affect: Mood normal.        Behavior: Behavior normal.        Thought Content: Thought content normal.        Judgment: Judgment normal.    DG  Chest 2 View  Result Date: 10/23/2020 CLINICAL DATA:  Cough and congestion EXAM: CHEST - 2 VIEW COMPARISON:  05/24/2020 FINDINGS: Cardiac shadow is within normal limits. The lungs are well aerated bilaterally. No focal infiltrate or sizable effusion is seen. No acute bony abnormality is noted. IMPRESSION: No acute abnormality seen. Electronically Signed   By: Inez Catalina M.D.   On: 10/23/2020 20:07    Assessment and Plan :   I have reviewed the PDMP during this encounter.  1. Cough   2. Moderate persistent asthma without complication   3. Wheezing     IM Depo-Medrol in clinic. Refilled albuterol inhaler. Recommended establishing care with a pulmonologist for better management of her asthma. Deferred COVID 19 testing given timeline of her symptoms, x-ray. Counseled patient on potential for adverse effects with medications prescribed/recommended today, ER and return-to-clinic precautions discussed, patient verbalized understanding.    Jaynee Eagles, [place] 10/24/20 [phone]

## 2020-10-23 NOTE — ED Triage Notes (Signed)
Pt presents with cough, sore throat, and headache since Sept. Has been tested for COVID and had chest xray since. Having to use inhaler daily.

## 2020-11-16 DIAGNOSIS — M9903 Segmental and somatic dysfunction of lumbar region: Secondary | ICD-10-CM | POA: Diagnosis not present

## 2020-11-16 DIAGNOSIS — M9906 Segmental and somatic dysfunction of lower extremity: Secondary | ICD-10-CM | POA: Diagnosis not present

## 2020-11-16 DIAGNOSIS — M2142 Flat foot [pes planus] (acquired), left foot: Secondary | ICD-10-CM | POA: Diagnosis not present

## 2020-11-16 DIAGNOSIS — M2141 Flat foot [pes planus] (acquired), right foot: Secondary | ICD-10-CM | POA: Diagnosis not present

## 2020-11-23 ENCOUNTER — Institutional Professional Consult (permissible substitution): Payer: BC Managed Care – PPO | Admitting: Pulmonary Disease

## 2020-12-19 ENCOUNTER — Institutional Professional Consult (permissible substitution): Payer: BC Managed Care – PPO | Admitting: Pulmonary Disease

## 2021-01-22 ENCOUNTER — Encounter: Payer: Self-pay | Admitting: Pulmonary Disease

## 2021-01-22 ENCOUNTER — Ambulatory Visit: Payer: BC Managed Care – PPO | Admitting: Pulmonary Disease

## 2021-01-22 ENCOUNTER — Other Ambulatory Visit: Payer: Self-pay

## 2021-01-22 VITALS — BP 126/74 | HR 73 | Temp 98.9°F | Resp 18 | Ht 66.0 in | Wt 315.2 lb

## 2021-01-22 DIAGNOSIS — R0683 Snoring: Secondary | ICD-10-CM

## 2021-01-22 DIAGNOSIS — H9312 Tinnitus, left ear: Secondary | ICD-10-CM

## 2021-01-22 DIAGNOSIS — K219 Gastro-esophageal reflux disease without esophagitis: Secondary | ICD-10-CM

## 2021-01-22 DIAGNOSIS — R0982 Postnasal drip: Secondary | ICD-10-CM | POA: Diagnosis not present

## 2021-01-22 DIAGNOSIS — J4530 Mild persistent asthma, uncomplicated: Secondary | ICD-10-CM | POA: Diagnosis not present

## 2021-01-22 MED ORDER — MOMETASONE FUROATE 50 MCG/ACT NA SUSP: 2.0000 | Freq: Every day | NASAL | 11 refills | Status: DC

## 2021-01-22 MED ORDER — IPRATROPIUM BROMIDE 0.03 % NA SOLN: 2.0000 | Freq: Two times a day (BID) | NASAL | 12 refills | Status: DC

## 2021-01-22 MED ORDER — PANTOPRAZOLE SODIUM 40 MG PO TBEC: 40.0000 mg | DELAYED_RELEASE_TABLET | Freq: Every day | ORAL | 6 refills | Status: DC

## 2021-01-22 MED ORDER — BUDESONIDE-FORMOTEROL FUMARATE 160-4.5 MCG/ACT IN AERO: 2.0000 | INHALATION_SPRAY | Freq: Two times a day (BID) | RESPIRATORY_TRACT | 6 refills | Status: DC

## 2021-01-22 NOTE — Progress Notes (Signed)
s  Synopsis: Referred in June 2022 for Bronchitis by Betty Martinique, MD  Subjective:   PATIENT ID: Alexa West GENDER: female DOB: [birth year], MRN: [id number]   HPI  Chief Complaint  Patient presents with   Consult    Bronchitis history     Alexa West is a 50 year old woman, never smoker with history of asthma and GERD who is referred to pulmonary clinic for evaluation of bronchitis.   She reports developing episodes of cough, sinus congestion and drainage along with wheezing and shortness of breath and wheezing January in the late fall.  She reports albuterol use during these times does provide some relief.  She has not been on maintenance inhalers in the past.  She will take over-the-counter Xyzal as needed for sinus congestion and drainage. She does not like nasal sprays due to the smell and drainage.  She also reports ear fullness and pain of the right ear.  She also complains of tinnitus of the left ear.  She does report significant GERD symptoms all the time.  She is currently on over-the-counter Nexium 20 mg daily.  She is trying to work on diet modifications which she knows does alleviate her reflux symptoms.  She denies any nighttime awakenings due to cough, shortness of breath or wheezing but during these acute episodes she will have nighttime awakenings.  She works at [employer].  She denies any hazardous dust or chemical exposures.   She does report snoring and that her husband has expressed concerns for apneic events while sleeping.  Past Medical History:  Diagnosis Date   Abnormal Pap smear of cervix 1996   HPV +   Anxiety    Asthma    Chicken pox    Depression    Genital warts    GERD (gastroesophageal reflux disease)    History of diverticulitis    History of genital warts    Hx: UTI (urinary tract infection)    Migraines    Urine incontinence      Family History  Problem Relation Age of Onset   Arthritis Mother    Depression Mother     Hyperlipidemia Mother    74 / [place] Mother    Skin cancer Mother    Early death Father    Alcohol abuse Maternal Grandmother    Arthritis Maternal Grandmother    Depression Maternal Grandmother    Diabetes Maternal Grandmother    Hyperlipidemia Maternal Grandmother    Hypertension Maternal Grandmother    Kidney disease Maternal Grandmother    Arthritis Maternal Grandfather    Hypertension Maternal Grandfather    Kidney disease Maternal Grandfather    Heart attack Maternal Grandfather    Leukemia Maternal Grandfather    Bone cancer Maternal Grandfather    Alcohol abuse Paternal Grandmother    Cancer Paternal Grandmother    Alcohol abuse Paternal Grandfather      Social History   Socioeconomic History   Marital status: Married    Spouse name: Not on file   Number of children: 0   Years of education: Not on file   Highest education level: Not on file  Occupational History   Not on file  Tobacco Use   Smoking status: Never   Smokeless tobacco: Never  Vaping Use   Vaping Use: Never used  Substance and Sexual Activity   Alcohol use: No   Drug use: No   Sexual activity: Yes    Partners: Male  Other Topics Concern  Not on file  Social History Narrative   Not on file   Social Determinants of Health   Financial Resource Strain: Not on file  Food Insecurity: Not on file  Transportation Needs: Not on file  Physical Activity: Not on file  Stress: Not on file  Social Connections: Not on file  Intimate Partner Violence: Not on file     Allergies  Allergen Reactions   Cashew Nut Oil    Milk-Related Compounds Diarrhea   Wheat Bran    Cephalosporins Hives and Rash   Penicillins Hives and Rash     Outpatient Medications Prior to Visit  Medication Sig Dispense Refill   acetaminophen (TYLENOL) 500 MG tablet Take 500 mg by mouth every 8 (eight) hours as needed.     albuterol (VENTOLIN HFA) 108 (90 Base) MCG/ACT inhaler Inhale 2 puffs into the lungs  every 6 (six) hours as needed for wheezing or shortness of breath. 18 g 0   AUVI-Q 0.3 MG/0.3ML SOAJ injection      celecoxib (CELEBREX) 100 MG capsule TAKE 1 CAPSULE BY MOUTH TWICE DAILY. 40 capsule 0   doxycycline (VIBRAMYCIN) 100 MG capsule Take one cap PO Q12hr with food. 20 capsule 0   gabapentin (NEURONTIN) 100 MG capsule 2 cap in am 1 po in pm and 1 po qhs (Patient taking differently: 2 cap in am 1 po in pm) 120 capsule 1   guaiFENesin-codeine 100-10 MG/5ML syrup Take 5mL by mouth at bedtime as needed for cough. 50 mL 0   levocetirizine (XYZAL) 5 MG tablet TAKE 1 TABLET BY MOUTH EVERY EVENING AS NEEDED, haven't started yet  5   lidocaine (LIDODERM) 5 % Place 1 patch onto the skin daily. Remove & Discard patch within 12 hours or as directed by MD 30 patch 0   ondansetron (ZOFRAN) 4 MG tablet Take 1 tablet (4 mg total) by mouth every 8 (eight) hours as needed for nausea or vomiting. 20 tablet 0   promethazine-dextromethorphan (PROMETHAZINE-DM) 6.25-15 MG/5ML syrup Take 5 mLs by mouth at bedtime as needed for cough. 100 mL 0   rizatriptan (MAXALT-MLT) 10 MG disintegrating tablet DISSOLVE 1 TABLET BY MOUTH AS NEEDED FOR MIGRAINE. MAY REPEAT IN 2 HOURS IF NEEDED 9 tablet 2   sertraline (ZOLOFT) 100 MG tablet Take 1.5 tablets (150 mg total) by mouth daily. 135 tablet 4   Spacer/Aero-Holding Chambers (AEROCHAMBER PLUS) inhaler Use as instructed to use with inahaler. 1 each 1   spironolactone (ALDACTONE) 100 MG tablet TAKE 1 TABLET (100 MG TOTAL) BY MOUTH 2 (TWO) TIMES DAILY. 180 tablet 4   DEXILANT 60 MG capsule TAKE 1 CAPSULE (60 MG TOTAL) BY MOUTH DAILY AS NEEDED. 90 capsule 1   fluticasone (FLOVENT HFA) 110 MCG/ACT inhaler Inhale 2 puffs into the lungs 2 (two) times daily. 1 Inhaler 1   benzonatate (TESSALON) 100 MG capsule Take 1-2 capsules (100-200 mg total) by mouth 3 (three) times daily as needed. (Patient not taking: Reported on 01/22/2021) 60 capsule 0   No facility-administered  medications prior to visit.    Review of Systems  Constitutional:  Negative for chills, fever, malaise/fatigue and weight loss.  HENT:  Positive for congestion, ear pain and tinnitus. Negative for sinus pain and sore throat.   Eyes: Negative.   Respiratory:  Positive for cough, shortness of breath and wheezing. Negative for hemoptysis and sputum production.   Cardiovascular:  Negative for chest pain, palpitations, orthopnea, claudication and leg swelling.  Gastrointestinal:  Positive for heartburn. Negative  for abdominal pain, nausea and vomiting.  Genitourinary: Negative.   Musculoskeletal:  Negative for joint pain and myalgias.  Skin:  Negative for rash.  Neurological:  Negative for weakness.  Endo/Heme/Allergies: Negative.   Psychiatric/Behavioral: Negative.       Objective:   Vitals:   01/22/21 1041  BP: 126/74  Pulse: 73  Resp: 18  Temp: 98.9 F (37.2 C)  TempSrc: Oral  SpO2: 97%  Weight: (!) 315 lb 3.2 oz (143 kg)  Height: 5\' 6"  (1.676 m)     Physical Exam Constitutional:      General: She is not in acute distress.    Appearance: Normal appearance. She is not ill-appearing.  HENT:     Head: Normocephalic and atraumatic.     Nose: Nose normal.     Mouth/Throat:     Mouth: Mucous membranes are moist.     Pharynx: Oropharynx is clear.  Eyes:     General: No scleral icterus.    Conjunctiva/sclera: Conjunctivae normal.     Pupils: Pupils are equal, round, and reactive to light.  Cardiovascular:     Rate and Rhythm: Normal rate and regular rhythm.     Pulses: Normal pulses.     Heart sounds: Normal heart sounds. No murmur heard. Pulmonary:     Effort: Pulmonary effort is normal.     Breath sounds: Normal breath sounds. No wheezing, rhonchi or rales.  Abdominal:     General: Bowel sounds are normal.     Palpations: Abdomen is soft.  Musculoskeletal:     Right lower leg: No edema.     Left lower leg: No edema.  Lymphadenopathy:     Cervical: No cervical  adenopathy.  Skin:    General: Skin is warm and dry.  Neurological:     General: No focal deficit present.     Mental Status: She is alert.  Psychiatric:        Mood and Affect: Mood normal.        Behavior: Behavior normal.        Thought Content: Thought content normal.        Judgment: Judgment normal.      CBC    Component Value Date/Time   WBC 9.5 03/07/2020 0851   RBC 5.09 03/07/2020 0851   HGB 14.4 03/07/2020 0851   HCT 43.8 03/07/2020 0851   PLT 363 03/07/2020 0851   MCV 86.1 03/07/2020 0851   MCH 28.3 03/07/2020 0851   MCHC 32.9 03/07/2020 0851   RDW 13.4 03/07/2020 0851   LYMPHSABS 1,710 03/07/2020 0851   MONOABS 0.7 05/09/2018 1526   EOSABS 57 03/07/2020 0851   BASOSABS 38 03/07/2020 0851   BMP Latest Ref Rng & Units 03/07/2020 02/01/2019 05/09/2018  Glucose 65 - 99 mg/dL 103(H) 91 89  BUN 7 - 25 mg/dL 8 8 9   Creatinine 0.50 - 1.10 mg/dL 0.67 0.76 0.88  BUN/Creat Ratio 6 - 22 (calc) NOT APPLICABLE 11 -  Sodium 135 - 146 mmol/L 136 136 135  Potassium 3.5 - 5.3 mmol/L 4.1 4.0 4.1  Chloride 98 - 110 mmol/L 103 100 104  CO2 20 - 32 mmol/L 25 21 23   Calcium 8.6 - 10.2 mg/dL 9.1 8.9 9.3    Chest imaging: CXR 10/23/20 Cardiac shadow is within normal limits. The lungs are well aerated bilaterally. No focal infiltrate or sizable effusion is seen. No acute bony abnormality is noted.  CXR 05/24/20 The cardiomediastinal contours are normal. Peribronchial thickening which is increased from  prior exam. Pulmonary vasculature is normal. No consolidation, pleural effusion, or pneumothorax. No acute osseous abnormalities are seen. Thoracic spondylosis.  DG Esophagus 11/20/18 1. Mild gastroesophageal reflux elicited.  No hiatal hernia. 2. Mild esophageal dysmotility, characteristic of chronic reflux related dysmotility. 3. No evidence of reflux esophagitis. No evidence of esophageal mass, stricture or ulcer.  PFT: No flowsheet data found.  Assessment & Plan:    Mild persistent reactive airway disease without complication - Plan: budesonide-formoterol (SYMBICORT) 160-4.5 MCG/ACT inhaler  Post-nasal drainage - Plan: mometasone (NASONEX) 50 MCG/ACT nasal spray, ipratropium (ATROVENT) 0.03 % nasal spray  Gastroesophageal reflux disease without esophagitis - Plan: pantoprazole (PROTONIX) 40 MG tablet  Tinnitus of left ear - Plan: Ambulatory referral to ENT  Snoring - Plan: Home sleep test  Discussion: Alexa West is a 50 year old woman, never smoker with history of asthma and GERD who is referred to pulmonary clinic for evaluation of bronchitis.   These episodes of prolonged cough, wheezing and shortness of breath during early winter months in late fall months are concerning for asthma exacerbations.  She is to start Symbicort inhaler 2 puffs twice daily.  She can continue to use albuterol as needed.  For her seasonal allergies she is to take Xyzal daily.  We will try her on Nasonex nasal spray 2 sprays per nostril daily and ipratropium nasal spray 2 sprays per nostril twice daily for her sinus congestion and postnasal drainage.  For her GERD she is to take pantoprazole 40 mg daily.  For the concern of snoring and apneic events at night we will schedule her for a home sleep study in the future.  For her right ear pain and left ear tinnitus we will refer her to ENT for further evaluation.  Follow-up in 3 months.  Freda Jackson, MD [facility] Office: [phone]   Current Outpatient Medications:    acetaminophen (TYLENOL) 500 MG tablet, Take 500 mg by mouth every 8 (eight) hours as needed., Disp: , Rfl:    albuterol (VENTOLIN HFA) 108 (90 Base) MCG/ACT inhaler, Inhale 2 puffs into the lungs every 6 (six) hours as needed for wheezing or shortness of breath., Disp: 18 g, Rfl: 0   AUVI-Q 0.3 MG/0.3ML SOAJ injection, , Disp: , Rfl:    budesonide-formoterol (SYMBICORT) 160-4.5 MCG/ACT inhaler, Inhale 2 puffs into the  lungs 2 (two) times daily., Disp: 1 each, Rfl: 6   celecoxib (CELEBREX) 100 MG capsule, TAKE 1 CAPSULE BY MOUTH TWICE DAILY., Disp: 40 capsule, Rfl: 0   doxycycline (VIBRAMYCIN) 100 MG capsule, Take one cap PO Q12hr with food., Disp: 20 capsule, Rfl: 0   gabapentin (NEURONTIN) 100 MG capsule, 2 cap in am 1 po in pm and 1 po qhs (Patient taking differently: 2 cap in am 1 po in pm), Disp: 120 capsule, Rfl: 1   guaiFENesin-codeine 100-10 MG/5ML syrup, Take 107mL by mouth at bedtime as needed for cough., Disp: 50 mL, Rfl: 0   ipratropium (ATROVENT) 0.03 % nasal spray, Place 2 sprays into both nostrils every 12 (twelve) hours., Disp: 30 mL, Rfl: 12   levocetirizine (XYZAL) 5 MG tablet, TAKE 1 TABLET BY MOUTH EVERY EVENING AS NEEDED, haven't started yet, Disp: , Rfl: 5   lidocaine (LIDODERM) 5 %, Place 1 patch onto the skin daily. Remove & Discard patch within 12 hours or as directed by MD, Disp: 30 patch, Rfl: 0   mometasone (NASONEX) 50 MCG/ACT nasal spray, Place 2 sprays into the nose daily., Disp: 1  each, Rfl: 11   ondansetron (ZOFRAN) 4 MG tablet, Take 1 tablet (4 mg total) by mouth every 8 (eight) hours as needed for nausea or vomiting., Disp: 20 tablet, Rfl: 0   pantoprazole (PROTONIX) 40 MG tablet, Take 1 tablet (40 mg total) by mouth daily., Disp: 30 tablet, Rfl: 6   promethazine-dextromethorphan (PROMETHAZINE-DM) 6.25-15 MG/5ML syrup, Take 5 mLs by mouth at bedtime as needed for cough., Disp: 100 mL, Rfl: 0   rizatriptan (MAXALT-MLT) 10 MG disintegrating tablet, DISSOLVE 1 TABLET BY MOUTH AS NEEDED FOR MIGRAINE. MAY REPEAT IN 2 HOURS IF NEEDED, Disp: 9 tablet, Rfl: 2   sertraline (ZOLOFT) 100 MG tablet, Take 1.5 tablets (150 mg total) by mouth daily., Disp: 135 tablet, Rfl: 4   Spacer/Aero-Holding Chambers (AEROCHAMBER PLUS) inhaler, Use as instructed to use with inahaler., Disp: 1 each, Rfl: 1   spironolactone (ALDACTONE) 100 MG tablet, TAKE 1 TABLET (100 MG TOTAL) BY MOUTH 2 (TWO) TIMES DAILY.,  Disp: 180 tablet, Rfl: 4   benzonatate (TESSALON) 100 MG capsule, Take 1-2 capsules (100-200 mg total) by mouth 3 (three) times daily as needed. (Patient not taking: Reported on 01/22/2021), Disp: 60 capsule, Rfl: 0

## 2021-01-22 NOTE — Patient Instructions (Addendum)
Start symbicort 2 puffs twice daily - rinse mouth out after each use  Use albuterol inhaler as needed 1-2 puffs every 4-6 hours for shortness of breath, cough or wheezing  Take xyzal daily  Use nasonex nasal spray, 2 sprays per nostril daily  Use ipratropium nasal spray, 2 sprays per nostril twice daily  Take pantoprazole 40mg  daily for GERD  We will schedule you for a home sleep study in the future

## 2021-01-25 ENCOUNTER — Encounter: Payer: Self-pay | Admitting: Pulmonary Disease

## 2021-03-01 DIAGNOSIS — H5213 Myopia, bilateral: Secondary | ICD-10-CM | POA: Diagnosis not present

## 2021-03-12 ENCOUNTER — Encounter: Payer: Self-pay | Admitting: Pulmonary Disease

## 2021-04-04 ENCOUNTER — Telehealth: Payer: BC Managed Care – PPO | Admitting: Physician Assistant

## 2021-04-04 ENCOUNTER — Encounter: Payer: Self-pay | Admitting: Physician Assistant

## 2021-04-04 DIAGNOSIS — J4541 Moderate persistent asthma with (acute) exacerbation: Secondary | ICD-10-CM

## 2021-04-04 MED ORDER — BENZONATATE 100 MG PO CAPS: 100.0000 mg | ORAL_CAPSULE | Freq: Three times a day (TID) | ORAL | 0 refills | Status: DC | PRN

## 2021-04-04 MED ORDER — PREDNISONE 20 MG PO TABS: 40.0000 mg | ORAL_TABLET | Freq: Every day | ORAL | 0 refills | Status: DC

## 2021-04-04 MED ORDER — PREDNISONE 20 MG PO TABS: 20.0000 mg | ORAL_TABLET | Freq: Every day | ORAL | 0 refills | Status: DC

## 2021-04-04 MED ORDER — AZITHROMYCIN 250 MG PO TABS: ORAL_TABLET | ORAL | 0 refills | Status: AC

## 2021-04-04 NOTE — Patient Instructions (Signed)
Viviene Swaim Marschke, thank you for joining Leeanne Rio, PA-C for today's virtual visit.  While this provider is not your primary care provider (PCP), if your PCP is located in our provider database this encounter information will be shared with them immediately following your visit.  Consent: (Patient) Alexa West provided verbal consent for this virtual visit at the beginning of the encounter.  Current Medications:  Current Outpatient Medications:    acetaminophen (TYLENOL) 500 MG tablet, Take 500 mg by mouth every 8 (eight) hours as needed., Disp: , Rfl:    albuterol (VENTOLIN HFA) 108 (90 Base) MCG/ACT inhaler, Inhale 2 puffs into the lungs every 6 (six) hours as needed for wheezing or shortness of breath., Disp: 18 g, Rfl: 0   AUVI-Q 0.3 MG/0.3ML SOAJ injection, , Disp: , Rfl:    benzonatate (TESSALON) 100 MG capsule, Take 1-2 capsules (100-200 mg total) by mouth 3 (three) times daily as needed. (Patient not taking: Reported on 01/22/2021), Disp: 60 capsule, Rfl: 0   budesonide-formoterol (SYMBICORT) 160-4.5 MCG/ACT inhaler, Inhale 2 puffs into the lungs 2 (two) times daily., Disp: 1 each, Rfl: 6   celecoxib (CELEBREX) 100 MG capsule, TAKE 1 CAPSULE BY MOUTH TWICE DAILY., Disp: 40 capsule, Rfl: 0   doxycycline (VIBRAMYCIN) 100 MG capsule, Take one cap PO Q12hr with food., Disp: 20 capsule, Rfl: 0   gabapentin (NEURONTIN) 100 MG capsule, 2 cap in am 1 po in pm and 1 po qhs (Patient taking differently: 2 cap in am 1 po in pm), Disp: 120 capsule, Rfl: 1   guaiFENesin-codeine 100-10 MG/5ML syrup, Take 78m by mouth at bedtime as needed for cough., Disp: 50 mL, Rfl: 0   ipratropium (ATROVENT) 0.03 % nasal spray, Place 2 sprays into both nostrils every 12 (twelve) hours., Disp: 30 mL, Rfl: 12   levocetirizine (XYZAL) 5 MG tablet, TAKE 1 TABLET BY MOUTH EVERY EVENING AS NEEDED, haven't started yet, Disp: , Rfl: 5   lidocaine (LIDODERM) 5 %, Place 1 patch onto the skin daily. Remove &  Discard patch within 12 hours or as directed by MD, Disp: 30 patch, Rfl: 0   mometasone (NASONEX) 50 MCG/ACT nasal spray, Place 2 sprays into the nose daily., Disp: 1 each, Rfl: 11   ondansetron (ZOFRAN) 4 MG tablet, Take 1 tablet (4 mg total) by mouth every 8 (eight) hours as needed for nausea or vomiting., Disp: 20 tablet, Rfl: 0   pantoprazole (PROTONIX) 40 MG tablet, Take 1 tablet (40 mg total) by mouth daily., Disp: 30 tablet, Rfl: 6   promethazine-dextromethorphan (PROMETHAZINE-DM) 6.25-15 MG/5ML syrup, Take 5 mLs by mouth at bedtime as needed for cough., Disp: 100 mL, Rfl: 0   rizatriptan (MAXALT-MLT) 10 MG disintegrating tablet, DISSOLVE 1 TABLET BY MOUTH AS NEEDED FOR MIGRAINE. MAY REPEAT IN 2 HOURS IF NEEDED, Disp: 9 tablet, Rfl: 2   sertraline (ZOLOFT) 100 MG tablet, Take 1.5 tablets (150 mg total) by mouth daily., Disp: 135 tablet, Rfl: 4   Spacer/Aero-Holding Chambers (AEROCHAMBER PLUS) inhaler, Use as instructed to use with inahaler., Disp: 1 each, Rfl: 1   spironolactone (ALDACTONE) 100 MG tablet, TAKE 1 TABLET (100 MG TOTAL) BY MOUTH 2 (TWO) TIMES DAILY., Disp: 180 tablet, Rfl: 4   Medications ordered in this encounter:  No orders of the defined types were placed in this encounter.    *If you need refills on other medications prior to your next appointment, please contact your pharmacy*  Follow-Up: Call back or seek an in-person evaluation  if the symptoms worsen or if the condition fails to improve as anticipated.  Other Instructions Please let me know ASAP once your COVID results are in.  Otherwise continue care discussed at time of visit. Increase fluids. Rest. Start the prednisone and Azithromycin as directed. Continue albuterol but no more than as directed. You can use the Tessalon along with OTC Mucinex-DM for congestion or cough.   If COVID test positive, we will need to have you quarantine and start antiviral medications.   If anything worsens regardless of test  results, you need evaluation at nearest Urgent Care or ER.   If you have been instructed to have an in-person evaluation today at a local Urgent Care facility, please use the link below. It will take you to a list of all of our available [facility], including address, phone number and hours of operation. Please do not delay care.  [facility]  If you or a family member do not have a primary care provider, use the link below to schedule a visit and establish care. When you choose a [facility] primary care physician or advanced practice provider, you gain a long-term partner in health. Find a Primary Care Provider  Learn more about [facility] in-office and virtual care options: [facility]

## 2021-04-04 NOTE — Progress Notes (Signed)
Virtual Visit Consent   Alexa West, you are scheduled for a virtual visit with a [facility] provider today.     Just as with appointments in the office, your consent must be obtained to participate.  Your consent will be active for this visit and any virtual visit you may have with one of our providers in the next 365 days.     If you have a MyChart account, a copy of this consent can be sent to you electronically.  All virtual visits are billed to your insurance company just like a traditional visit in the office.    As this is a virtual visit, video technology does not allow for your provider to perform a traditional examination.  This may limit your provider's ability to fully assess your condition.  If your provider identifies any concerns that need to be evaluated in person or the need to arrange testing (such as labs, EKG, etc.), we will make arrangements to do so.     Although advances in technology are sophisticated, we cannot ensure that it will always work on either your end or our end.  If the connection with a video visit is poor, the visit may have to be switched to a telephone visit.  With either a video or telephone visit, we are not always able to ensure that we have a secure connection.     I need to obtain your verbal consent now.   Are you willing to proceed with your visit today?    Alexa West has provided verbal consent on 04/04/2021 for a virtual visit (video or telephone).   Leeanne Rio, [place]   Date: 04/04/2021 9:59 AM   Virtual Visit via Video Note   I, Leeanne Rio, PA-C, attempted to connect with Alexa West; MRN [id number] on 04/04/21 via [facility] to complete a video urgent care visit. The patient was unable to successfully connect to the video platform. As such, the patient was contacted by this provider via phone to complete the encounter.   Location: Patient: Virtual Visit Location Patient: Home Provider: Virtual Visit  Location Provider: Home Office   I discussed the limitations of evaluation and management by telemedicine and the availability of in person appointments. The patient expressed understanding and agreed to proceed.    History of Present Illness: Alexa West is a 50 y.o. who identifies as a female who was assigned female at birth, and is being seen today for increased wheezing with chest tightness since Saturday evening. Has history of asthma, worsened over past year. Has been seen by Pulmonology who started her on Symbicort but she has not been able to get medication yet due to some unforseen financial issues arising that have taken priority. As such she has been just using her Albuterol about twice daily since symptoms picked up. Was also noting chest congestion with a cough that was dry but now productive of thick green sputum. Denies any overt fever. Husband sick for past two weeks but getting better. No known exposure to COVID.   HPI: HPI  Problems:  Patient Active Problem List   Diagnosis Date Noted   Generalized osteoarthritis of multiple sites 05/17/2020   Fatigue 05/17/2020   Chronic pain disorder 05/17/2020   Primary osteoarthritis of left hip 03/03/2020   Right knee pain 10/27/2018   Neck pain 05/13/2018   Body mass index 50.0-59.9, adult ([place]) 05/13/2018   PCOS (polycystic ovarian syndrome) 12/05/2017   History of IBS 12/05/2017  S/P cholecystectomy 12/05/2017   Gastric ulcer with hemorrhage [id number]   Eosinophilic esophagitis [id number]   Morbid obesity ([facility]) 10/13/2017   Migraine headache without aura 10/13/2017   GI bleed 08/20/2016   Transaminitis 08/20/2016   Prediabetes 08/20/2016   Lumbar pain 05/24/2014   Impaired fasting glucose 09/18/2010   Depression 03/04/2008   Esophageal reflux 03/04/2008    Allergies:  Allergies  Allergen Reactions   Cashew Nut Oil    Milk-Related Compounds Diarrhea   Wheat Bran    Cephalosporins Hives and Rash   Penicillins  Hives and Rash   Medications:  Current Outpatient Medications:    azithromycin (ZITHROMAX) 250 MG tablet, Take 2 tablets on day 1, then 1 tablet daily on days 2 through 5, Disp: 6 tablet, Rfl: 0   benzonatate (TESSALON) 100 MG capsule, Take 1 capsule (100 mg total) by mouth 3 (three) times daily as needed for cough., Disp: 30 capsule, Rfl: 0   predniSONE (DELTASONE) 20 MG tablet, Take 1 tablet (20 mg total) by mouth daily with breakfast., Disp: 10 tablet, Rfl: 0   acetaminophen (TYLENOL) 500 MG tablet, Take 500 mg by mouth every 8 (eight) hours as needed., Disp: , Rfl:    albuterol (VENTOLIN HFA) 108 (90 Base) MCG/ACT inhaler, Inhale 2 puffs into the lungs every 6 (six) hours as needed for wheezing or shortness of breath., Disp: 18 g, Rfl: 0   AUVI-Q 0.3 MG/0.3ML SOAJ injection, , Disp: , Rfl:    budesonide-formoterol (SYMBICORT) 160-4.5 MCG/ACT inhaler, Inhale 2 puffs into the lungs 2 (two) times daily., Disp: 1 each, Rfl: 6   ipratropium (ATROVENT) 0.03 % nasal spray, Place 2 sprays into both nostrils every 12 (twelve) hours., Disp: 30 mL, Rfl: 12   levocetirizine (XYZAL) 5 MG tablet, TAKE 1 TABLET BY MOUTH EVERY EVENING AS NEEDED, haven't started yet, Disp: , Rfl: 5   lidocaine (LIDODERM) 5 %, Place 1 patch onto the skin daily. Remove & Discard patch within 12 hours or as directed by MD, Disp: 30 patch, Rfl: 0   mometasone (NASONEX) 50 MCG/ACT nasal spray, Place 2 sprays into the nose daily., Disp: 1 each, Rfl: 11   ondansetron (ZOFRAN) 4 MG tablet, Take 1 tablet (4 mg total) by mouth every 8 (eight) hours as needed for nausea or vomiting., Disp: 20 tablet, Rfl: 0   pantoprazole (PROTONIX) 40 MG tablet, Take 1 tablet (40 mg total) by mouth daily., Disp: 30 tablet, Rfl: 6   rizatriptan (MAXALT-MLT) 10 MG disintegrating tablet, DISSOLVE 1 TABLET BY MOUTH AS NEEDED FOR MIGRAINE. MAY REPEAT IN 2 HOURS IF NEEDED, Disp: 9 tablet, Rfl: 2   sertraline (ZOLOFT) 100 MG tablet, Take 1.5 tablets (150 mg  total) by mouth daily., Disp: 135 tablet, Rfl: 4   Spacer/Aero-Holding Chambers (AEROCHAMBER PLUS) inhaler, Use as instructed to use with inahaler., Disp: 1 each, Rfl: 1   spironolactone (ALDACTONE) 100 MG tablet, TAKE 1 TABLET (100 MG TOTAL) BY MOUTH 2 (TWO) TIMES DAILY., Disp: 180 tablet, Rfl: 4  Observations/Objective: Patient is well-developed, well-nourished in no acute distress.  No labored breathing. Audible inspiratory/expiratory wheeze on occasion.  Speech is clear and coherent with logical content.  Patient is alert and oriented at baseline.   Assessment and Plan: 1. Moderate persistent asthma with exacerbation - benzonatate (TESSALON) 100 MG capsule; Take 1 capsule (100 mg total) by mouth 3 (three) times daily as needed for cough.  Dispense: 30 capsule; Refill: 0 - predniSONE (DELTASONE) 20 MG tablet; Take 1 tablet (20  mg total) by mouth daily with breakfast.  Dispense: 10 tablet; Refill: 0 - azithromycin (ZITHROMAX) 250 MG tablet; Take 2 tablets on day 1, then 1 tablet daily on days 2 through 5  Dispense: 6 tablet; Refill: 0 Will have her get COVID tested to be cautious giving her risk factors and need for antivirals if positive. This does seem more indicative of true asthma exacerbation especially since she has not been able to start her maintenance medication. Concern for secondary bronchitis giving change in her sputum. Will start her on 5-day burst of 40 mg prednisone. Continue albuterol as directed when needed. Rx Tessalon for cough. Rx Azithromycin. Strict ER precautions reviewed with patient. Recommend routine follow-up with Pulmonology as well. Husband to pick up a pulse ox when he gets her medications today.   Follow Up Instructions: I discussed the assessment and treatment plan with the patient. The patient was provided an opportunity to ask questions and all were answered. The patient agreed with the plan and demonstrated an understanding of the instructions.  A copy of  instructions were sent to the patient via MyChart.  The patient was advised to call back or seek an in-person evaluation if the symptoms worsen or if the condition fails to improve as anticipated.  Time:  I spent 15 minutes with the patient via telehealth technology discussing the above problems/concerns.    Leeanne Rio, PA-C

## 2021-04-12 ENCOUNTER — Ambulatory Visit: Payer: No Typology Code available for payment source

## 2021-04-16 ENCOUNTER — Other Ambulatory Visit: Payer: Self-pay | Admitting: Family Medicine

## 2021-04-16 MED ORDER — RIZATRIPTAN BENZOATE 10 MG PO TBDP
ORAL_TABLET | ORAL | 2 refills | Status: DC
Start: 2021-04-16 — End: 2021-10-10

## 2021-05-02 DIAGNOSIS — M47816 Spondylosis without myelopathy or radiculopathy, lumbar region: Secondary | ICD-10-CM | POA: Diagnosis not present

## 2021-05-03 DIAGNOSIS — E785 Hyperlipidemia, unspecified: Secondary | ICD-10-CM | POA: Insufficient documentation

## 2021-05-03 NOTE — Progress Notes (Signed)
ACUTE VISIT Chief Complaint  Patient presents with   Joint Swelling   HPI: Alexa West is a 50 y.o. female with hx of asthma,OA,PUD,migraine headaches,HLD,and PCOS here today complaining of IP joints pain, getting worse.  Problem has been going on for about 6 years. Now hand joint pain is limiting some daily activities, pain is exacerbated by activities like driving. 6 months ago started being painful to touch and affecting left index and middle finger. "Excruciating pain."  + Edema, no erythema Right handed. Thumb pain is constant. Dull pain, 10/10. No new activities or hx of trauma.  Prediabetes: She has not been consistent with following a healthful diet or engaging in regular exercise. Negative for abdominal pain, nausea,vomiting, polydipsia,polyuria, or polyphagia.  Lab Results  Component Value Date   HGBA1C 5.7 (H) 03/07/2020   Hyperlipidemia: Currently on non pharmacologic treatment. Following a low fat diet: Not consistently.  Lab Results  Component Value Date   CHOL 184 03/27/2020   HDL 41 (L) 03/27/2020   LDLCALC 116 (H) 03/27/2020   TRIG 154 (H) 03/27/2020   CHOLHDL 4.5 03/27/2020   Review of Systems  Constitutional:  Positive for fatigue. Negative for activity change, appetite change and fever.  Respiratory:  Negative for cough, shortness of breath and wheezing.   Cardiovascular:  Negative for chest pain and palpitations.  Gastrointestinal:        Negative for changes in bowel habits.  Endocrine: Negative for cold intolerance and heat intolerance.  Genitourinary:  Negative for decreased urine volume and hematuria.  Neurological:  Negative for syncope, weakness and numbness.  Rest see pertinent positives and negatives per HPI.  Current Outpatient Medications on File Prior to Visit  Medication Sig Dispense Refill   acetaminophen (TYLENOL) 500 MG tablet Take 500 mg by mouth every 8 (eight) hours as needed.     albuterol (VENTOLIN HFA)  108 (90 Base) MCG/ACT inhaler Inhale 2 puffs into the lungs every 6 (six) hours as needed for wheezing or shortness of breath. 18 g 0   AUVI-Q 0.3 MG/0.3ML SOAJ injection      benzonatate (TESSALON) 100 MG capsule Take 1 capsule (100 mg total) by mouth 3 (three) times daily as needed for cough. 30 capsule 0   budesonide-formoterol (SYMBICORT) 160-4.5 MCG/ACT inhaler Inhale 2 puffs into the lungs 2 (two) times daily. 1 each 6   ipratropium (ATROVENT) 0.03 % nasal spray Place 2 sprays into both nostrils every 12 (twelve) hours. 30 mL 12   levocetirizine (XYZAL) 5 MG tablet TAKE 1 TABLET BY MOUTH EVERY EVENING AS NEEDED, haven't started yet  5   lidocaine (LIDODERM) 5 % Place 1 patch onto the skin daily. Remove & Discard patch within 12 hours or as directed by MD 30 patch 0   mometasone (NASONEX) 50 MCG/ACT nasal spray Place 2 sprays into the nose daily. 1 each 11   ondansetron (ZOFRAN) 4 MG tablet Take 1 tablet (4 mg total) by mouth every 8 (eight) hours as needed for nausea or vomiting. 20 tablet 0   pantoprazole (PROTONIX) 40 MG tablet Take 1 tablet (40 mg total) by mouth daily. 30 tablet 6   predniSONE (DELTASONE) 20 MG tablet Take 2 tablets (40 mg total) by mouth daily with breakfast. 10 tablet 0   rizatriptan (MAXALT-MLT) 10 MG disintegrating tablet DISSOLVE 1 TABLET BY MOUTH AS NEEDED FOR MIGRAINE. MAY REPEAT IN 2 HOURS IF NEEDED 9 tablet 2   sertraline (ZOLOFT) 100 MG tablet Take 1.5 tablets (  150 mg total) by mouth daily. 135 tablet 4   Spacer/Aero-Holding Chambers (AEROCHAMBER PLUS) inhaler Use as instructed to use with inahaler. 1 each 1   spironolactone (ALDACTONE) 100 MG tablet TAKE 1 TABLET (100 MG TOTAL) BY MOUTH 2 (TWO) TIMES DAILY. 180 tablet 4   No current facility-administered medications on file prior to visit.   Past Medical History:  Diagnosis Date   Abnormal Pap smear of cervix 1996   HPV +   Anxiety    Asthma    Chicken pox    Depression    Genital warts    GERD  (gastroesophageal reflux disease)    History of diverticulitis    History of genital warts    Hx: UTI (urinary tract infection)    Migraines    Urine incontinence    Allergies  Allergen Reactions   Cashew Nut Oil    Milk-Related Compounds Diarrhea   Wheat Bran    Cephalosporins Hives and Rash   Penicillins Hives and Rash   Social History   Socioeconomic History   Marital status: Married    Spouse name: Not on file   Number of children: 0   Years of education: Not on file   Highest education level: Not on file  Occupational History   Not on file  Tobacco Use   Smoking status: Never   Smokeless tobacco: Never  Vaping Use   Vaping Use: Never used  Substance and Sexual Activity   Alcohol use: No   Drug use: No   Sexual activity: Yes    Partners: Male  Other Topics Concern   Not on file  Social History Narrative   Not on file   Social Determinants of Health   Financial Resource Strain: Not on file  Food Insecurity: Not on file  Transportation Needs: Not on file  Physical Activity: Not on file  Stress: Not on file  Social Connections: Not on file   Vitals:   05/04/21 1518  BP: 120/74  Pulse: 78  Resp: 16  SpO2: 96%   Body mass index is 51.49 kg/m.  Physical Exam Vitals and nursing note reviewed.  Constitutional:      General: She is not in acute distress.    Appearance: She is well-developed.  HENT:     Head: Normocephalic and atraumatic.  Eyes:     Conjunctiva/sclera: Conjunctivae normal.  Cardiovascular:     Rate and Rhythm: Normal rate and regular rhythm.     Heart sounds: No murmur heard. Pulmonary:     Effort: Pulmonary effort is normal. No respiratory distress.     Breath sounds: Normal breath sounds.  Abdominal:     Palpations: Abdomen is soft. There is no hepatomegaly or mass.     Tenderness: There is no abdominal tenderness.  Musculoskeletal:     Right hand: Normal range of motion.     Left hand: Tenderness and bony tenderness present.  Normal range of motion. Normal capillary refill. Normal pulse.     Comments: Left hand: Mild tenderness upon palpation of radial styloid, no edema or erythema appreciated, no limitation of wrist ROM. Pain also elicited on radial styloid with Finkelstein maneuver and with palpation of thenar area. No signs of synovitis.   Lymphadenopathy:     Cervical: No cervical adenopathy.  Skin:    General: Skin is warm.     Findings: No erythema or rash.  Neurological:     General: No focal deficit present.     Mental Status:  She is alert and oriented to person, place, and time.     Cranial Nerves: No cranial nerve deficit.     Gait: Gait normal.  Psychiatric:     Comments: Well groomed, good eye contact.   ASSESSMENT AND PLAN:  Ms.Marleta was seen today for joint swelling.  Diagnoses and all orders for this visit: Orders Placed This Encounter  Procedures   Lipid panel   Hemoglobin Y0D   Basic metabolic panel   Ambulatory referral to Sports Medicine   Lab Results  Component Value Date   CREATININE 0.90 05/04/2021   BUN 7 05/04/2021   NA 137 05/04/2021   K 3.8 05/04/2021   CL 102 05/04/2021   CO2 27 05/04/2021   Lab Results  Component Value Date   HGBA1C 5.8 (H) 05/04/2021   Lab Results  Component Value Date   CHOL 169 05/04/2021   HDL 38 (L) 05/04/2021   LDLCALC 109 (H) 05/04/2021   TRIG 110 05/04/2021   CHOLHDL 4.4 05/04/2021   Arthralgia of hand, left We discussed possible etiologies, OA most likely. Hx of PUD ,sp we need to be careful with NSAID's, I think Meloxicam is a good option. She agrees with Meloxicam 7.5 mg daily prn. I do not think imaging is needed at this time.  -     meloxicam (MOBIC) 7.5 MG tablet; Take 1 tablet (7.5 mg total) by mouth daily as needed for pain.  Prediabetes A healthy life style encouraged for wt loss and therefore diabetes prevention. Further recommendations will be given according to HgA1C result.  Hyperlipidemia, unspecified  hyperlipidemia type Non pharmacologic treatment recommended for now. Further recommendations will be given according to 10 years CVD risk score and lipid panel numbers.  The 10-year ASCVD risk score (Arnett DK, et al., 2019) is: 2.5%   Values used to calculate the score:     Age: 25 years     Sex: Female     Is Non-Hispanic African American: No     Diabetic: Yes     Tobacco smoker: No     Systolic Blood Pressure: 983 mmHg     Is BP treated: No     HDL Cholesterol: 38 mg/dL     Total Cholesterol: 169 mg/dL  De Quervain's tenosynovitis, left We discussed Dx and differential Dx's. Referral to sport medicine placed. Wrist splint with thumb support may help, Rx given.  Return if symptoms worsen or fail to improve.  Lezli Danek G. [place], MD  [facility]. [place] office.

## 2021-05-04 ENCOUNTER — Ambulatory Visit: Payer: BC Managed Care – PPO | Admitting: Family Medicine

## 2021-05-04 ENCOUNTER — Other Ambulatory Visit: Payer: Self-pay

## 2021-05-04 ENCOUNTER — Encounter: Payer: Self-pay | Admitting: Family Medicine

## 2021-05-04 VITALS — BP 120/74 | HR 78 | Resp 16 | Ht 66.0 in | Wt 319.0 lb

## 2021-05-04 DIAGNOSIS — M25542 Pain in joints of left hand: Secondary | ICD-10-CM | POA: Diagnosis not present

## 2021-05-04 DIAGNOSIS — E785 Hyperlipidemia, unspecified: Secondary | ICD-10-CM

## 2021-05-04 DIAGNOSIS — R7303 Prediabetes: Secondary | ICD-10-CM

## 2021-05-04 DIAGNOSIS — M654 Radial styloid tenosynovitis [de Quervain]: Secondary | ICD-10-CM | POA: Diagnosis not present

## 2021-05-04 MED ORDER — MELOXICAM 7.5 MG PO TABS: 7.5000 mg | ORAL_TABLET | Freq: Every day | ORAL | 1 refills | Status: DC | PRN

## 2021-05-04 NOTE — Patient Instructions (Addendum)
A few things to remember from today's visit:   Prediabetes - Plan: Hemoglobin A1c, Hemoglobin A1c, CANCELED: Hemoglobin A1c  Hyperlipidemia, unspecified hyperlipidemia type - Plan: Lipid panel, Hemoglobin Z6S, Basic metabolic panel, Basic metabolic panel, Hemoglobin A1c, Lipid panel, CANCELED: Basic metabolic panel, CANCELED: Hemoglobin A1c, CANCELED: Lipid panel  Arthralgia of hand, left - Plan: meloxicam (MOBIC) 7.5 MG tablet  De Quervain's tenosynovitis, left - Plan: Ambulatory referral to Sports Medicine  If you need refills please call your pharmacy. Do not use My Chart to request refills or for acute issues that need immediate attention.   De Quervain's Tenosynovitis De Quervain's tenosynovitis is a condition that causes inflammation of the tendon on the thumb side of the wrist. Tendons are cords of tissue that connect bones to muscles. The tendons in the hand pass through a tunnel called a sheath. A slippery layer of tissue (synovium) lets the tendons move smoothly in the sheath. With de Quervain's tenosynovitis, the sheath swells or thickens, causing friction and pain. The condition is also called de Quervain's disease and de Quervain's syndrome. It occurs most often in women who are 65-71 years old. What are the causes? The exact cause of this condition is not known. It may be associated with overuse of the hand and wrist. What increases the risk? You are more likely to develop this condition if you: Use your hands far more than normal, especially if you repeat certain movements that involve twisting your hand or using a tight grip. Are pregnant. Are a middle-aged woman. Have rheumatoid arthritis. Have diabetes. What are the signs or symptoms? The main symptom of this condition is pain on the thumb side of the wrist. The pain may get worse when you grasp something or turn your wrist. Other symptoms may include: Pain that extends up the forearm. Swelling of your wrist and  hand. Trouble moving the thumb and wrist. A sensation of snapping in the wrist. A bump filled with fluid (cyst) in the area of the pain. How is this diagnosed? This condition may be diagnosed based on: Your symptoms and medical history. A physical exam. During the exam, your health care provider may do a simple test Wynn Maudlin test) that involves pulling your thumb and wrist to see if this causes pain. You may also need to have an X-ray or ultrasound. How is this treated? Treatment for this condition may include: Avoiding any activity that causes pain and swelling. Taking medicines. Anti-inflammatory medicines and corticosteroid injections may be used to reduce inflammation and relieve pain. Wearing a splint. Having surgery. This may be needed if other treatments do not work. Once the pain and swelling have gone down, you may start: Physical therapy. This includes exercises to improve movement and strength in your wrist and thumb. Occupational therapy. This includes adjusting how you move your wrist. Follow these instructions at home: If you have a splint: Wear the splint as told by your health care provider. Remove it only as told by your health care provider. Loosen the splint if your fingers tingle, become numb, or turn cold and blue. Keep the splint clean. If the splint is not waterproof: Do not let it get wet. Cover it with a watertight covering when you take a bath or a shower. Managing pain, stiffness, and swelling  Avoid movements and activities that cause pain and swelling in the wrist area. If directed, put ice on the painful area. This may be helpful after doing activities that involve the sore wrist. To do  this: Put ice in a plastic bag. Place a towel between your skin and the bag. Leave the ice on for 20 minutes, 2-3 times a day. Remove the ice if your skin turns bright red. This is very important. If you cannot feel pain, heat, or cold, you have a greater risk of  damage to the area. Move your fingers often to reduce stiffness and swelling. Raise (elevate) the injured area above the level of your heart while you are sitting or lying down. General instructions Return to your normal activities as told by your health care provider. Ask your health care provider what activities are safe for you. Take over-the-counter and prescription medicines only as told by your health care provider. Keep all follow-up visits. This is important. Contact a health care provider if: Your pain medicine does not help. Your pain gets worse. You develop new symptoms. Summary De Quervain's tenosynovitis is a condition that causes inflammation of the tendon on the thumb side of the wrist. The condition occurs most often in women who are 72-26 years old. The exact cause of this condition is not known. It may be associated with overuse of the hand and wrist. Treatment starts with avoiding activity that causes pain or swelling in the wrist area. Other treatments may include wearing a splint and taking medicine. Sometimes, surgery is needed. This information is not intended to replace advice given to you by your health care provider. Make sure you discuss any questions you have with your health care provider. Document Revised: 10/27/2019 Document Reviewed: 10/27/2019 Elsevier Patient Education  2022 [place].  Please be sure medication list is accurate. If a new problem present, please set up appointment sooner than planned today.

## 2021-05-05 LAB — LIPID PANEL
Cholesterol: 169 mg/dL (ref <200)
HDL: 38 mg/dL — ABNORMAL LOW (ref >=50)
LDL Cholesterol (Calc): 109 mg/dL (calc) — ABNORMAL HIGH
Non-HDL Cholesterol (Calc): 131 mg/dL (calc) — ABNORMAL HIGH (ref <130)
Total CHOL/HDL Ratio: 4.4 (calc) (ref <5.0)
Triglycerides: 110 mg/dL (ref <150)

## 2021-05-05 LAB — BASIC METABOLIC PANEL
BUN: 7 mg/dL (ref 7–25)
CO2: 27 mmol/L (ref 20–32)
Calcium: 9.1 mg/dL (ref 8.6–10.2)
Chloride: 102 mmol/L (ref 98–110)
Creat: 0.9 mg/dL (ref 0.50–0.99)
Glucose, Bld: 94 mg/dL (ref 65–99)
Potassium: 3.8 mmol/L (ref 3.5–5.3)
Sodium: 137 mmol/L (ref 135–146)

## 2021-05-05 LAB — HEMOGLOBIN A1C
Hgb A1c MFr Bld: 5.8 % of total Hgb — ABNORMAL HIGH (ref <5.7)
Mean Plasma Glucose: 120 mg/dL
eAG (mmol/L): 6.6 mmol/L

## 2021-06-25 ENCOUNTER — Other Ambulatory Visit: Payer: Self-pay

## 2021-06-25 ENCOUNTER — Other Ambulatory Visit (HOSPITAL_COMMUNITY)
Admission: RE | Admit: 2021-06-25 | Discharge: 2021-06-25 | Disposition: A | Discharge status: Home / Self Care | Payer: BC Managed Care – PPO | Source: Ambulatory Visit | Attending: Obstetrics & Gynecology | Admitting: Obstetrics & Gynecology

## 2021-06-25 ENCOUNTER — Ambulatory Visit (INDEPENDENT_AMBULATORY_CARE_PROVIDER_SITE_OTHER): Payer: BC Managed Care – PPO | Admitting: Obstetrics & Gynecology

## 2021-06-25 ENCOUNTER — Encounter (HOSPITAL_BASED_OUTPATIENT_CLINIC_OR_DEPARTMENT_OTHER): Payer: Self-pay | Admitting: Obstetrics & Gynecology

## 2021-06-25 VITALS — BP 137/85 | HR 71 | Ht 65.5 in | Wt 320.0 lb

## 2021-06-25 DIAGNOSIS — Z124 Encounter for screening for malignant neoplasm of cervix: Secondary | ICD-10-CM

## 2021-06-25 DIAGNOSIS — Z1211 Encounter for screening for malignant neoplasm of colon: Secondary | ICD-10-CM

## 2021-06-25 DIAGNOSIS — E282 Polycystic ovarian syndrome: Secondary | ICD-10-CM

## 2021-06-25 DIAGNOSIS — Z0000 Encounter for general adult medical examination without abnormal findings: Secondary | ICD-10-CM | POA: Diagnosis not present

## 2021-06-25 DIAGNOSIS — Z1231 Encounter for screening mammogram for malignant neoplasm of breast: Secondary | ICD-10-CM

## 2021-06-25 DIAGNOSIS — Z9889 Other specified postprocedural states: Secondary | ICD-10-CM

## 2021-06-25 DIAGNOSIS — Z01419 Encounter for gynecological examination (general) (routine) without abnormal findings: Secondary | ICD-10-CM

## 2021-06-25 DIAGNOSIS — R195 Other fecal abnormalities: Secondary | ICD-10-CM

## 2021-06-25 MED ORDER — SPIRONOLACTONE 100 MG PO TABS: ORAL_TABLET | Freq: Two times a day (BID) | ORAL | 4 refills | Status: DC

## 2021-06-25 NOTE — Progress Notes (Signed)
50 y.o. G0P0000 Married White or Caucasian female here for annual exam.  Cycles are not regular.  Had skipped a month here or there.  Flow lasts 2 days, max.  This is a lot better than it used to be.    LMP:  06/20/2021.         Sexually active: No.  The current method of family planning is none.    Exercising: no Smoker:  no  Health Maintenance: Pap:  today History of abnormal Pap:  yes, LEEP 1996 MMG:  2019 Colonoscopy:  referred last year.  No family hx of colon cancer.   Screening Labs: ordered today and some drawn in October   reports that she has never smoked. She has never used smokeless tobacco. She reports that she does not drink alcohol and does not use drugs.  Past Medical History:  Diagnosis Date   Abnormal Pap smear of cervix 1996   HPV +   Anxiety    Asthma    Chicken pox    Depression    Genital warts    GERD (gastroesophageal reflux disease)    History of diverticulitis    History of genital warts    Hx: UTI (urinary tract infection)    Migraines    Urine incontinence     Past Surgical History:  Procedure Laterality Date   CERVICAL BIOPSY  W/ LOOP ELECTRODE EXCISION  [place]   TONSILLECTOMY AND ADENOIDECTOMY  1996   urethral stretching  1980    Current Outpatient Medications  Medication Sig Dispense Refill   acetaminophen (TYLENOL) 500 MG tablet Take 500 mg by mouth every 8 (eight) hours as needed.     albuterol (VENTOLIN HFA) 108 (90 Base) MCG/ACT inhaler Inhale 2 puffs into the lungs every 6 (six) hours as needed for wheezing or shortness of breath. 18 g 0   AUVI-Q 0.3 MG/0.3ML SOAJ injection      budesonide-formoterol (SYMBICORT) 160-4.5 MCG/ACT inhaler Inhale 2 puffs into the lungs 2 (two) times daily. 1 each 6   levocetirizine (XYZAL) 5 MG tablet TAKE 1 TABLET BY MOUTH EVERY EVENING AS NEEDED, haven't started yet  5   meloxicam (MOBIC) 7.5 MG tablet Take 1 tablet (7.5 mg total) by mouth daily as needed  for pain. 30 tablet 1   ondansetron (ZOFRAN) 4 MG tablet Take 1 tablet (4 mg total) by mouth every 8 (eight) hours as needed for nausea or vomiting. 20 tablet 0   pantoprazole (PROTONIX) 40 MG tablet Take 1 tablet (40 mg total) by mouth daily. 30 tablet 6   rizatriptan (MAXALT-MLT) 10 MG disintegrating tablet DISSOLVE 1 TABLET BY MOUTH AS NEEDED FOR MIGRAINE. MAY REPEAT IN 2 HOURS IF NEEDED 9 tablet 2   sertraline (ZOLOFT) 100 MG tablet Take 1.5 tablets (150 mg total) by mouth daily. 135 tablet 4   Spacer/Aero-Holding Chambers (AEROCHAMBER PLUS) inhaler Use as instructed to use with inahaler. 1 each 1   benzonatate (TESSALON) 100 MG capsule Take 1 capsule (100 mg total) by mouth 3 (three) times daily as needed for cough. (Patient not taking: Reported on 06/25/2021) 30 capsule 0   ipratropium (ATROVENT) 0.03 % nasal spray Place 2 sprays into both nostrils every 12 (twelve) hours. (Patient not taking: Reported on 06/25/2021) 30 mL 12   mometasone (NASONEX) 50 MCG/ACT nasal spray Place 2 sprays into the nose daily. (Patient not taking: Reported on 06/25/2021) 1 each 11   predniSONE (DELTASONE)  20 MG tablet Take 2 tablets (40 mg total) by mouth daily with breakfast. (Patient not taking: Reported on 06/25/2021) 10 tablet 0   spironolactone (ALDACTONE) 100 MG tablet TAKE 1 TABLET (100 MG TOTAL) BY MOUTH 2 (TWO) TIMES DAILY. 180 tablet 4   No current facility-administered medications for this visit.    Family History  Problem Relation Age of Onset   Arthritis Mother    Depression Mother    Hyperlipidemia Mother    Miscarriages / [place] Mother    Skin cancer Mother    Early death Father    Alcohol abuse Maternal Grandmother    Arthritis Maternal Grandmother    Depression Maternal Grandmother    Diabetes Maternal Grandmother    Hyperlipidemia Maternal Grandmother    Hypertension Maternal Grandmother    Kidney disease Maternal Grandmother    Arthritis Maternal Grandfather    Hypertension  Maternal Grandfather    Kidney disease Maternal Grandfather    Heart attack Maternal Grandfather    Leukemia Maternal Grandfather    Bone cancer Maternal Grandfather    Alcohol abuse Paternal Grandmother    Cancer Paternal Grandmother    Alcohol abuse Paternal Grandfather     Review of Systems  Exam:   BP 137/85 (BP Location: Left Arm, Patient Position: Sitting, Cuff Size: Large)   Pulse 71   Ht 5' 5.5" (1.664 m) Comment: reported  Wt (!) 320 lb (145.2 kg)   LMP 06/18/2021   BMI 52.44 kg/m   Height: 5' 5.5" (166.4 cm) (reported)  General appearance: alert, cooperative and appears stated age Head: Normocephalic, without obvious abnormality, atraumatic Neck: no adenopathy, supple, symmetrical, trachea midline and thyroid normal to inspection and palpation Lungs: clear to auscultation bilaterally Breasts: normal appearance, no masses or tenderness Heart: regular rate and rhythm Abdomen: soft, non-tender; bowel sounds normal; no masses,  no organomegaly Extremities: extremities normal, atraumatic, no cyanosis or edema Skin: Skin color, texture, turgor normal. No rashes or lesions Lymph nodes: Cervical, supraclavicular, and axillary nodes normal. No abnormal inguinal nodes palpated Neurologic: Grossly normal   Pelvic: External genitalia:  no lesions              Urethra:  normal appearing urethra with no masses, tenderness or lesions              Bartholins and Skenes: normal                 Vagina: normal appearing vagina with normal color and no discharge, no lesions              Cervix: no lesions              Pap taken: Yes.   Bimanual Exam:  Uterus:  normal size, contour, position, consistency, mobility, non-tender              Adnexa: normal adnexa and no mass, fullness, tenderness               Rectovaginal: Confirms               Anus:  normal sphincter tone, no lesions  Chaperone, Octaviano Batty, CMA, was present for exam.  Assessment/Plan: 1. Well woman exam with  routine gynecological exam - pap and HR HPV obtained today - MMG ordered - Colonoscopy referral placed, again - lab work ordered  2. Encounter for screening mammogram for malignant neoplasm of breast - MM 3D SCREEN BREAST BILATERAL; Future  3. Colon cancer screening - Ambulatory referral to  Gastroenterology  4.  History of loop electrical excision procedure (LEEP) - 1996  5. H/O stool guaiac positive - did not go for GI evaluation.  Referral placed again.  6. PCOS (polycystic ovarian syndrome) - spironolactone 100mg  bid #180/4RF  7. Morbid obesity ([place])

## 2021-06-26 LAB — CYTOLOGY - PAP
Adequacy: ABSENT
Comment: NEGATIVE
Diagnosis: NEGATIVE
High risk HPV: NEGATIVE

## 2021-06-28 ENCOUNTER — Ambulatory Visit (HOSPITAL_BASED_OUTPATIENT_CLINIC_OR_DEPARTMENT_OTHER)
Admission: RE | Admit: 2021-06-28 | Discharge: 2021-06-28 | Disposition: A | Discharge status: Home / Self Care | Payer: BC Managed Care – PPO | Source: Ambulatory Visit | Attending: Obstetrics & Gynecology | Admitting: Obstetrics & Gynecology

## 2021-06-28 ENCOUNTER — Other Ambulatory Visit: Payer: Self-pay

## 2021-06-28 DIAGNOSIS — Z1231 Encounter for screening mammogram for malignant neoplasm of breast: Secondary | ICD-10-CM | POA: Diagnosis not present

## 2021-06-28 LAB — TSH

## 2021-06-28 LAB — T4, FREE: Free T4: 1.2 ng/dL (ref 0.82–1.77)

## 2021-06-28 LAB — VITAMIN D 25 HYDROXY (VIT D DEFICIENCY, FRACTURES): Vit D, 25-Hydroxy: 32.1 ng/mL (ref 30.0–100.0)

## 2021-06-28 NOTE — Progress Notes (Signed)
Alexa West D.[facility] Phone: [phone]   Assessment and Plan:     1. Left hand pain 2. 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  Patient has history of GI ulcer, so we will only use NSAIDs and short bursts  - Start wearing thumb spica splint to limit thumb motion  Sports Medicine: Musculoskeletal Ultrasound. Exam: Limited US of left thumb Diagnosis: Chronic left thumb pain  US Findings: Limited joint space at the [facility] joint with cortical changes.  No acute fracture or significant edema  US Impression:  CMC arthritis    3. Polyarthralgia -Chronic with exacerbation, initial sports medicine visit - Multiple chronic musculoskeletal complaints without MOI or red flag symptoms - We will evaluate with lab work to rule out rheumatologic or autoimmune conditions and follow-up at next visit -Patient has experienced multiple musculoskeletal pains off and on for years.  She feels that she has seen multiple providers for these conditions, but is interested in having one primary provider to help her with these conditions Pertinent previous records reviewed include PCP note10/01/2021, right wrist x-ray 01/08/2018, MRI lumbar spine 05/23/2018   Follow Up: 2 weeks for reevaluation.  We will review lab work and discuss polyarthralgia.  We will discuss left thumb pain and could consider CMC CSI versus PT.   Subjective:   I, Alexa West, am serving as a scribe for Dr. 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 Patient was seen by her PCP on 05/04/21 for  this reason and was referred to sports medicine for evaluation and treatment. Patient is currently taking Meloxicam for the pain. Today patient states Mobic did not help , has thumb splint but does not help at all, pain with moving thumb and trying to grip, pain in thumb to the pointer finger since last summer fall   Relevant Historical Information: Elevated BMI, history of GI ulcer  Additional pertinent review of systems negative.   Current Outpatient Medications:    acetaminophen (TYLENOL) 500 MG tablet, Take 500 mg by mouth every 8 (eight) hours as needed., Disp: , Rfl:    albuterol (VENTOLIN HFA) 108 (90 Base) MCG/ACT inhaler, Inhale 2 puffs into the lungs every 6 (six) hours as needed for wheezing or shortness of breath., Disp: 18 g, Rfl: 0   AUVI-Q 0.3 MG/0.3ML SOAJ injection, , Disp: , Rfl:    budesonide-formoterol (SYMBICORT) 160-4.5 MCG/ACT inhaler, Inhale 2 puffs into the lungs 2 (two) times daily., Disp: 1 each, Rfl: 6   ipratropium (ATROVENT) 0.03 % nasal spray, Place 2 sprays into both nostrils every 12 (twelve) hours., Disp: 30 mL, Rfl: 12   levocetirizine (XYZAL) 5 MG tablet, TAKE 1 TABLET BY MOUTH EVERY EVENING AS NEEDED, haven't started yet, Disp: , Rfl: 5   meloxicam (MOBIC) 7.5 MG tablet, Take 1 tablet (7.5 mg total) by mouth daily as needed for pain., Disp: 30 tablet, Rfl: 1   mometasone (NASONEX) 50 MCG/ACT nasal spray, Place 2 sprays into the nose daily., Disp: 1 each, Rfl: 11   ondansetron (ZOFRAN) 4 MG tablet, Take 1 tablet (4 mg  total) by mouth every 8 (eight) hours as needed for nausea or vomiting., Disp: 20 tablet, Rfl: 0   pantoprazole (PROTONIX) 40 MG tablet, Take 1 tablet (40 mg total) by mouth daily., Disp: 30 tablet, Rfl: 6   rizatriptan (MAXALT-MLT) 10 MG disintegrating tablet, DISSOLVE 1 TABLET BY MOUTH AS NEEDED FOR MIGRAINE. MAY REPEAT IN 2 HOURS IF NEEDED, Disp: 9 tablet, Rfl: 2   sertraline (ZOLOFT) 100 MG tablet, Take 1.5 tablets (150 mg total) by mouth  daily., Disp: 135 tablet, Rfl: 4   Spacer/Aero-Holding Chambers (AEROCHAMBER PLUS) inhaler, Use as instructed to use with inahaler., Disp: 1 each, Rfl: 1   spironolactone (ALDACTONE) 100 MG tablet, TAKE 1 TABLET (100 MG TOTAL) BY MOUTH 2 (TWO) TIMES DAILY., Disp: 180 tablet, Rfl: 4   Objective:     Vitals:   06/29/21 0829  BP: 130/90  Pulse: 66  SpO2: 96%  Weight: (!) 322 lb (146.1 kg)  Height: 5\' 5"  (1.651 m)      Body mass index is 53.58 kg/m.    Physical Exam:    General: Appears well, nad, nontoxic and pleasant Neuro:sensation intact, strength is 5/5 with df/pf/inv/ev, muscle tone wnl Skin:no susupicious lesions or rashes  Left wrist:  No deformity or swelling appreciated. ROM  Ext 90, flexion70, radial/ulnar deviation 30 TTP CMC, mildly first MCP nttp over the snauff box, dorsal carpals, volar carpals, radial styloid, ulnar styloid, tfcc Grind test mildly painful at [facility].  No pain with grind test at first MCP Negative Tinel's Negative [place] Neg tfcc bounce test No pain with resisted ext, flex or deviation    Electronically signed by:  Alexa West D.[facility] 9:04 AM 06/29/21

## 2021-06-29 ENCOUNTER — Ambulatory Visit (INDEPENDENT_AMBULATORY_CARE_PROVIDER_SITE_OTHER)
Admission: RE | Admit: 2021-06-29 | Discharge: 2021-06-29 | Disposition: A | Discharge status: Home / Self Care | Payer: BC Managed Care – PPO | Source: Ambulatory Visit | Attending: Sports Medicine | Admitting: Sports Medicine

## 2021-06-29 ENCOUNTER — Other Ambulatory Visit: Payer: Self-pay

## 2021-06-29 ENCOUNTER — Ambulatory Visit: Payer: Self-pay

## 2021-06-29 ENCOUNTER — Ambulatory Visit: Payer: BC Managed Care – PPO | Admitting: Sports Medicine

## 2021-06-29 ENCOUNTER — Encounter: Payer: Self-pay | Admitting: Sports Medicine

## 2021-06-29 VITALS — BP 130/90 | HR 66 | Ht 65.0 in | Wt 322.0 lb

## 2021-06-29 DIAGNOSIS — M2550 Pain in unspecified joint: Secondary | ICD-10-CM | POA: Diagnosis not present

## 2021-06-29 DIAGNOSIS — M189 Osteoarthritis of first carpometacarpal joint, unspecified: Secondary | ICD-10-CM | POA: Diagnosis not present

## 2021-06-29 DIAGNOSIS — M1812 Unilateral primary osteoarthritis of first carpometacarpal joint, left hand: Secondary | ICD-10-CM | POA: Diagnosis not present

## 2021-06-29 DIAGNOSIS — M79642 Pain in left hand: Secondary | ICD-10-CM | POA: Diagnosis not present

## 2021-06-29 DIAGNOSIS — G8929 Other chronic pain: Secondary | ICD-10-CM | POA: Diagnosis not present

## 2021-06-29 DIAGNOSIS — M79645 Pain in left finger(s): Secondary | ICD-10-CM

## 2021-06-29 LAB — CBC WITH DIFFERENTIAL/PLATELET
Basophils Absolute: 0 10*3/uL (ref 0.0–0.1)
Basophils Relative: 0.4 % (ref 0.0–3.0)
Eosinophils Absolute: 0.8 10*3/uL — ABNORMAL HIGH (ref 0.0–0.7)
Eosinophils Relative: 12 % — ABNORMAL HIGH (ref 0.0–5.0)
HCT: 41 % (ref 36.0–46.0)
Hemoglobin: 13.7 g/dL (ref 12.0–15.0)
Lymphocytes Relative: 25.2 % (ref 12.0–46.0)
Lymphs Abs: 1.8 10*3/uL (ref 0.7–4.0)
MCHC: 33.4 g/dL (ref 30.0–36.0)
MCV: 84.3 fl (ref 78.0–100.0)
Monocytes Absolute: 0.4 10*3/uL (ref 0.1–1.0)
Monocytes Relative: 6 % (ref 3.0–12.0)
Neutro Abs: 3.9 10*3/uL (ref 1.4–7.7)
Neutrophils Relative %: 56.4 % (ref 43.0–77.0)
Platelets: 235 10*3/uL (ref 150.0–400.0)
RBC: 4.86 Mil/uL (ref 3.87–5.11)
RDW: 14.6 % (ref 11.5–15.5)
WBC: 7 10*3/uL (ref 4.0–10.5)

## 2021-06-29 LAB — IBC PANEL
Iron: 56 ug/dL (ref 42–145)
Saturation Ratios: 14.2 % — ABNORMAL LOW (ref 20.0–50.0)
TIBC: 393.4 ug/dL (ref 250.0–450.0)
Transferrin: 281 mg/dL (ref 212.0–360.0)

## 2021-06-29 LAB — COMPREHENSIVE METABOLIC PANEL
ALT: 30 U/L (ref 0–35)
AST: 35 U/L (ref 0–37)
Albumin: 4 g/dL (ref 3.5–5.2)
Alkaline Phosphatase: 77 U/L (ref 39–117)
BUN: 11 mg/dL (ref 6–23)
CO2: 27 mEq/L (ref 19–32)
Calcium: 9.4 mg/dL (ref 8.4–10.5)
Chloride: 102 mEq/L (ref 96–112)
Creatinine, Ser: 0.76 mg/dL (ref 0.40–1.20)
GFR: 92.05 mL/min (ref >60.00)
Glucose, Bld: 108 mg/dL — ABNORMAL HIGH (ref 70–99)
Potassium: 3.8 mEq/L (ref 3.5–5.1)
Sodium: 137 mEq/L (ref 135–145)
Total Bilirubin: 0.6 mg/dL (ref 0.2–1.2)
Total Protein: 7.4 g/dL (ref 6.0–8.3)

## 2021-06-29 LAB — SEDIMENTATION RATE: Sed Rate: 30 mm/hr — ABNORMAL HIGH (ref 0–20)

## 2021-06-29 LAB — C-REACTIVE PROTEIN: CRP: <1 mg/dL (ref 0.5–20.0)

## 2021-06-29 LAB — FERRITIN: Ferritin: 16 ng/mL (ref 10.0–291.0)

## 2021-06-29 NOTE — Patient Instructions (Addendum)
Good to see you  Restart meloxicam 15 mg 2 weeks  If you have 7.5 mg tablets take two a day  Start using a wrist brace daily for 2 weeks  Thumb spica brace to limit thumb motion [employer], [facility], [facility] ( off [address] ) Left Thumb x ray at [facility] on your way out  2 week follow up

## 2021-07-02 LAB — RHEUMATOID FACTOR: Rheumatoid fact SerPl-aCnc: <14 IU/mL (ref <14)

## 2021-07-02 LAB — PTH, INTACT AND CALCIUM
Calcium: 9.2 mg/dL (ref 8.6–10.2)
PTH: 31 pg/mL (ref 16–77)

## 2021-07-02 LAB — ANA: Anti Nuclear Antibody (ANA): NEGATIVE

## 2021-07-02 LAB — CALCIUM, IONIZED: Calcium, Ion: 5 mg/dL (ref 4.8–5.6)

## 2021-07-10 ENCOUNTER — Encounter (HOSPITAL_BASED_OUTPATIENT_CLINIC_OR_DEPARTMENT_OTHER): Payer: Self-pay | Admitting: *Deleted

## 2021-07-11 NOTE — Progress Notes (Deleted)
° °   Benito Mccreedy D.[place] Phone: [phone]   Assessment and Plan:     There are no diagnoses linked to this encounter.  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 Patient was seen by her PCP on 05/04/21 for this reason and was referred to sports medicine for evaluation and treatment. Patient is currently taking Meloxicam for the pain. Today patient states Mobic did not help , has thumb splint but does not help at all, pain with moving thumb and trying to grip, pain in thumb to the pointer finger since last summer fall   07/12/2021 Patient states    Relevant Historical Information: Elevated BMI, history of GI ulcer   Relevant Historical Information: ***  Additional pertinent review of systems negative.   Current Outpatient Medications:    acetaminophen (TYLENOL) 500 MG tablet, Take 500 mg by mouth every 8 (eight) hours as needed., Disp: , Rfl:    albuterol (VENTOLIN HFA) 108 (90 Base) MCG/ACT inhaler, Inhale 2 puffs into the lungs every 6 (six) hours as needed for wheezing or shortness of breath., Disp: 18 g, Rfl: 0   AUVI-Q 0.3 MG/0.3ML SOAJ injection, , Disp: , Rfl:    budesonide-formoterol (SYMBICORT) 160-4.5 MCG/ACT inhaler, Inhale 2 puffs into the lungs 2 (two) times daily., Disp: 1 each, Rfl: 6   ipratropium (ATROVENT) 0.03 % nasal spray, Place 2 sprays into both nostrils every 12 (twelve) hours., Disp: 30 mL, Rfl: 12   levocetirizine (XYZAL) 5 MG tablet, TAKE 1 TABLET BY MOUTH EVERY EVENING AS NEEDED, haven't started yet, Disp: , Rfl: 5   meloxicam (MOBIC) 7.5 MG tablet,  Take 1 tablet (7.5 mg total) by mouth daily as needed for pain., Disp: 30 tablet, Rfl: 1   mometasone (NASONEX) 50 MCG/ACT nasal spray, Place 2 sprays into the nose daily., Disp: 1 each, Rfl: 11   ondansetron (ZOFRAN) 4 MG tablet, Take 1 tablet (4 mg total) by mouth every 8 (eight) hours as needed for nausea or vomiting., Disp: 20 tablet, Rfl: 0   pantoprazole (PROTONIX) 40 MG tablet, Take 1 tablet (40 mg total) by mouth daily., Disp: 30 tablet, Rfl: 6   rizatriptan (MAXALT-MLT) 10 MG disintegrating tablet, DISSOLVE 1 TABLET BY MOUTH AS NEEDED FOR MIGRAINE. MAY REPEAT IN 2 HOURS IF NEEDED, Disp: 9 tablet, Rfl: 2   sertraline (ZOLOFT) 100 MG tablet, Take 1.5 tablets (150 mg total) by mouth daily., Disp: 135 tablet, Rfl: 4   Spacer/Aero-Holding Chambers (AEROCHAMBER PLUS) inhaler, Use as instructed to use with inahaler., Disp: 1 each, Rfl: 1   spironolactone (ALDACTONE) 100 MG tablet, TAKE 1 TABLET (100 MG TOTAL) BY MOUTH 2 (TWO) TIMES DAILY., Disp: 180 tablet, Rfl: 4   Objective:     There were no vitals filed for this visit.    There is no height or weight on file to calculate BMI.    Physical Exam:    ***   Electronically signed by:  Benito Mccreedy D.[facility] 3:41 PM 07/11/21

## 2021-07-12 ENCOUNTER — Ambulatory Visit: Payer: BC Managed Care – PPO | Admitting: Sports Medicine

## 2021-07-29 ENCOUNTER — Encounter (HOSPITAL_BASED_OUTPATIENT_CLINIC_OR_DEPARTMENT_OTHER): Payer: Self-pay | Admitting: Obstetrics & Gynecology

## 2021-08-07 ENCOUNTER — Other Ambulatory Visit: Payer: Self-pay | Admitting: Family Medicine

## 2021-08-07 DIAGNOSIS — M25542 Pain in joints of left hand: Secondary | ICD-10-CM

## 2021-10-10 ENCOUNTER — Other Ambulatory Visit: Payer: Self-pay | Admitting: Family Medicine

## 2021-10-23 ENCOUNTER — Encounter: Payer: Self-pay | Admitting: Family Medicine

## 2021-10-23 ENCOUNTER — Ambulatory Visit: Payer: BC Managed Care – PPO | Admitting: Family Medicine

## 2021-10-27 ENCOUNTER — Other Ambulatory Visit: Payer: Self-pay | Admitting: Pulmonary Disease

## 2021-10-27 DIAGNOSIS — K219 Gastro-esophageal reflux disease without esophagitis: Secondary | ICD-10-CM

## 2021-10-29 ENCOUNTER — Other Ambulatory Visit: Payer: Self-pay | Admitting: Pulmonary Disease

## 2021-10-29 DIAGNOSIS — K219 Gastro-esophageal reflux disease without esophagitis: Secondary | ICD-10-CM

## 2021-12-09 ENCOUNTER — Encounter: Payer: Self-pay | Admitting: Family Medicine

## 2021-12-12 NOTE — Progress Notes (Unsigned)
ACUTE VISIT Chief Complaint  Patient presents with   Headache    Come & go, may be environmental or foods that she is eating.   Dizziness   Knee Pain    Right knee, on & off. Swelling.    HPI: Alexa West is a 51 y.o. female, who is here today complaining of headaches, dizziness, and right knee pain. Hx of headache but seem to be more frequent in the past year. She takes Maxalt and Advil. She is having about 3-5 headaches per month. Achy and pressure pain that episodes last 12-15 hours. She has to lie down. Exacerbated by environmental exposures and certain foods.  Dizziness is intermittent, exacerbated by loud noises, which causes a "vibration" in her ears, L>R. Swimming feeling. She does not notice it when she is busy at work. It has been daily for the past 2 weeks. Occasional associated nausea, no other associated symptoms.  Headache  This is a chronic problem. The problem occurs intermittently. The problem has been unchanged. The pain is located in the Frontal, temporal and left unilateral region. The pain does not radiate. The pain quality is similar to prior headaches. The quality of the pain is described as aching. The pain is at a severity of 6/10. The pain is moderate. Associated symptoms include dizziness (No associated with headache), muscle aches (Pain "all over"), nausea, neck pain ("Always", left-sided), numbness (Hands, sometimes when holding the phone), tingling and tinnitus (Chronic L>R). Pertinent negatives include no abdominal pain, abnormal behavior, anorexia, back pain, blurred vision, coughing, drainage, ear pain, eye pain, eye redness, eye watering, facial sweating, fever, hearing loss, insomnia, loss of balance, phonophobia, photophobia, rhinorrhea, scalp tenderness, seizures, sinus pressure, sore throat, swollen glands, visual change, vomiting, weakness or weight loss. The symptoms are aggravated by weather changes and food. She has tried triptans  and NSAIDs for the symptoms. The treatment provided mild relief. Her past medical history is significant for migraine headaches and obesity.  Dizziness This is a new problem. The current episode started 1 to 4 weeks ago. The problem occurs intermittently. The problem has been waxing and waning. Associated symptoms include arthralgias, fatigue, headaches, myalgias, nausea, neck pain ("Always", left-sided) and numbness (Hands, sometimes when holding the phone). Pertinent negatives include no abdominal pain, anorexia, change in bowel habit, chest pain, chills, coughing, fever, joint swelling, rash, sore throat, swollen glands, urinary symptoms, visual change, vomiting or weakness. The symptoms are aggravated by walking. She has tried relaxation, sleep and rest for the symptoms.  Knee Pain  The incident occurred more than 1 week ago. There was no injury mechanism. The pain is present in the right knee. The quality of the pain is described as aching. The pain is moderate. The pain has been Intermittent since onset. Associated symptoms include numbness (Hands, sometimes when holding the phone) and tingling. Pertinent negatives include no inability to bear weight, loss of motion, loss of sensation or muscle weakness. Associated symptoms comments: Prolonged standing.Marland Kitchen Possible foreign bodies include glass, metal and wood. She has tried NSAIDs for the symptoms. The treatment provided mild relief.   She would like HgA1C check today. Negative for polydipsia,polyuria, or polyphagia.  Lab Results  Component Value Date   HGBA1C 5.8 (H) 05/04/2021   Medical knee pain. Sometimes she feels like knee is catching up. + Stiffness. Negative for erythema. + Edema.  Right knee X reay done in 09/2018 showed moderate medial and patellofemoral degenerative changes.  Requesting refills for Albuterol  inh, which  she uses every couple days.It was prescribed in urgent care.  Asthma:Intermittent wheezing after COVID 19  infection.Albuterol inh helps.  Review of Systems  Constitutional:  Positive for fatigue. Negative for chills, fever and weight loss.  HENT:  Positive for tinnitus (Chronic L>R). Negative for ear pain, hearing loss, rhinorrhea, sinus pressure and sore throat.   Eyes:  Negative for blurred vision, photophobia, pain and redness.  Respiratory:  Negative for cough.   Cardiovascular:  Negative for chest pain.  Gastrointestinal:  Positive for nausea. Negative for abdominal pain, anorexia, change in bowel habit and vomiting.  Endocrine: Negative for cold intolerance and heat intolerance.  Genitourinary:  Negative for decreased urine volume, dysuria and hematuria.  Musculoskeletal:  Positive for arthralgias, myalgias and neck pain ("Always", left-sided). Negative for back pain and joint swelling.  Skin:  Negative for rash.  Neurological:  Positive for dizziness (No associated with headache), tingling, numbness (Hands, sometimes when holding the phone) and headaches. Negative for seizures, weakness and loss of balance.  Psychiatric/Behavioral:  The patient does not have insomnia.   Rest see pertinent positives and negatives per HPI.  Current Outpatient Medications on File Prior to Visit  Medication Sig Dispense Refill   acetaminophen (TYLENOL) 500 MG tablet Take 500 mg by mouth every 8 (eight) hours as needed.     AUVI-Q 0.3 MG/0.3ML SOAJ injection      budesonide-formoterol (SYMBICORT) 160-4.5 MCG/ACT inhaler Inhale 2 puffs into the lungs 2 (two) times daily. 1 each 6   levocetirizine (XYZAL) 5 MG tablet TAKE 1 TABLET BY MOUTH EVERY EVENING AS NEEDED, haven't started yet  5   meloxicam (MOBIC) 7.5 MG tablet TAKE 1 TABLET(7.5 MG) BY MOUTH DAILY AS NEEDED FOR PAIN 30 tablet 1   ondansetron (ZOFRAN) 4 MG tablet Take 1 tablet (4 mg total) by mouth every 8 (eight) hours as needed for nausea or vomiting. 20 tablet 0   pantoprazole (PROTONIX) 40 MG tablet TAKE 1 TABLET BY MOUTH DAILY 30 tablet 0    rizatriptan (MAXALT-MLT) 10 MG disintegrating tablet DISSOLVE 1 TABLET ON THE TONGUE AS NEEDED FOR MIGRAINE. MAY REPEAT IN 2 HOURS AS NEEDED 9 tablet 2   sertraline (ZOLOFT) 100 MG tablet Take 1.5 tablets (150 mg total) by mouth daily. 135 tablet 4   Spacer/Aero-Holding Chambers (AEROCHAMBER PLUS) inhaler Use as instructed to use with inahaler. 1 each 1   spironolactone (ALDACTONE) 100 MG tablet TAKE 1 TABLET (100 MG TOTAL) BY MOUTH 2 (TWO) TIMES DAILY. 180 tablet 4   No current facility-administered medications on file prior to visit.   Past Medical History:  Diagnosis Date   Abnormal Pap smear of cervix 1996   HPV +   Anxiety    Asthma    Chicken pox    Depression    Genital warts    GERD (gastroesophageal reflux disease)    History of diverticulitis    History of genital warts    Hx: UTI (urinary tract infection)    Migraines    Urine incontinence    Allergies  Allergen Reactions   Cashew Nut Oil    Milk-Related Compounds Diarrhea   Wheat Bran    Cephalosporins Hives and Rash   Penicillins Hives and Rash    Social History   Socioeconomic History   Marital status: Married    Spouse name: Not on file   Number of children: 0   Years of education: Not on file   Highest education level: Not on file  Occupational History  Not on file  Tobacco Use   Smoking status: Never   Smokeless tobacco: Never  Vaping Use   Vaping Use: Never used  Substance and Sexual Activity   Alcohol use: No   Drug use: No   Sexual activity: Yes    Partners: Male  Other Topics Concern   Not on file  Social History Narrative   Not on file   Social Determinants of Health   Financial Resource Strain: Not on file  Food Insecurity: Not on file  Transportation Needs: Not on file  Physical Activity: Not on file  Stress: Not on file  Social Connections: Not on file    Vitals:   12/14/21 0923  BP: 118/70  Pulse: 84  Resp: 16  Temp: 98.7 F (37.1 C)  SpO2: 97%   Body mass index is  52.34 kg/m.  Physical Exam Vitals and nursing note reviewed.  Constitutional:      General: She is not in acute distress.    Appearance: She is well-developed.  HENT:     Head: Normocephalic and atraumatic.     Right Ear: Tympanic membrane, ear canal and external ear normal.     Left Ear: Tympanic membrane, ear canal and external ear normal.     Mouth/Throat:     Mouth: Mucous membranes are moist.     Pharynx: Oropharynx is clear.  Eyes:     Conjunctiva/sclera: Conjunctivae normal.  Cardiovascular:     Rate and Rhythm: Normal rate and regular rhythm.     Pulses:          Dorsalis pedis pulses are 2+ on the right side and 2+ on the left side.     Heart sounds: No murmur heard. Pulmonary:     Effort: Pulmonary effort is normal. No respiratory distress.     Breath sounds: Normal breath sounds.  Abdominal:     Palpations: Abdomen is soft. There is no hepatomegaly or mass.     Tenderness: There is no abdominal tenderness.  Musculoskeletal:     Cervical back: Tenderness present. Pain with movement (Rotation) present. Decreased range of motion.       Back:     Right knee: Crepitus present. No ecchymosis or bony tenderness. Normal range of motion. Tenderness present over the medial joint line and lateral joint line. No patellar tendon tenderness.     Instability Tests: Anterior drawer test negative. Posterior drawer test negative.     Comments: Right knee and hip pain with ROM.  Lymphadenopathy:     Cervical: No cervical adenopathy.  Skin:    General: Skin is warm.     Findings: No erythema or rash.  Neurological:     General: No focal deficit present.     Mental Status: She is alert and oriented to person, place, and time.     Cranial Nerves: No cranial nerve deficit.     Gait: Gait normal.     Deep Tendon Reflexes:     Reflex Scores:      Patellar reflexes are 2+ on the right side and 2+ on the left side. Psychiatric:     Comments: Well groomed, good eye contact.    ASSESSMENT AND PLAN:  Ms.Marcus was seen today for headache, dizziness and knee pain.  Diagnoses and all orders for this visit: Orders Placed This Encounter  Procedures   DG Knee Complete 4 Views Right   Hemoglobin A1c   C-reactive protein   Sedimentation rate   CBC   Basic  metabolic panel   Lab Results  Component Value Date   CREATININE 0.85 12/14/2021   BUN 8 12/14/2021   NA 138 12/14/2021   K 4.2 12/14/2021   CL 103 12/14/2021   CO2 28 12/14/2021   Lab Results  Component Value Date   WBC 7.4 12/14/2021   HGB 13.9 12/14/2021   HCT 41.2 12/14/2021   MCV 85.2 12/14/2021   PLT 276.0 12/14/2021   Lab Results  Component Value Date   CRP 1.1 12/14/2021   Lab Results  Component Value Date   ESRSEDRATE 37 (H) 12/14/2021   Right knee pain, unspecified chronicity We discussed possible etiologies. ? OA. Wt loss will help. Will obtain knee X ray. Continue Meloxicam 7.5 mg daily prn.  Headache, unspecified headache type Migraine vs tension Becoming more frequent but no new associated symptoms. We discussed treatment options, she agrees with trying Topiramate 25 mg at night. We discussed some side effects and risk of med interaction. Continue Maxalt daily as needed. Avoid foods that may trigger symptoms. Instructed about warning signs. F/U in 4-5 weeks with headache diary.  -     Topiramate (TOPAMAX) 25 MG tablet; Take 1 tablet (25 mg total) by mouth at bedtime.  Mild intermittent asthma without complication Continue Albuterol inh 2 puff qid prn. Lung auscultation negative today. I do not think imaging is needed.  -     albuterol (VENTOLIN HFA) 108 (90 Base) MCG/ACT inhaler; Inhale 2 puffs into the lungs every 6 (six) hours as needed for wheezing or shortness of breath.  Dizziness Examination today and hx do not suggest a serious process. Fall precautions. Monitor for new symptoms. Further recommendations according to blood work results.  I spent a total of  41 minutes in both face to face and non face to face activities for this visit on the date of this encounter. During this time history was obtained and documented, examination was performed, prior labs/imaging reviewed, and assessment/plan discussed.  Return in about 5 weeks (around 01/18/2022).  Maryfrances Portugal G. Martinique, MD  [facility]. [place] office.

## 2021-12-14 ENCOUNTER — Ambulatory Visit (INDEPENDENT_AMBULATORY_CARE_PROVIDER_SITE_OTHER): Payer: BC Managed Care – PPO

## 2021-12-14 ENCOUNTER — Ambulatory Visit: Payer: BC Managed Care – PPO | Admitting: Family Medicine

## 2021-12-14 ENCOUNTER — Other Ambulatory Visit: Payer: Self-pay | Admitting: Family Medicine

## 2021-12-14 ENCOUNTER — Encounter: Payer: Self-pay | Admitting: Family Medicine

## 2021-12-14 VITALS — BP 118/70 | HR 84 | Temp 98.7°F | Resp 16 | Ht 65.0 in | Wt 314.5 lb

## 2021-12-14 DIAGNOSIS — R519 Headache, unspecified: Secondary | ICD-10-CM | POA: Diagnosis not present

## 2021-12-14 DIAGNOSIS — Z20822 Contact with and (suspected) exposure to covid-19: Secondary | ICD-10-CM

## 2021-12-14 DIAGNOSIS — J4520 Mild intermittent asthma, uncomplicated: Secondary | ICD-10-CM

## 2021-12-14 DIAGNOSIS — M1711 Unilateral primary osteoarthritis, right knee: Secondary | ICD-10-CM | POA: Diagnosis not present

## 2021-12-14 DIAGNOSIS — R42 Dizziness and giddiness: Secondary | ICD-10-CM | POA: Diagnosis not present

## 2021-12-14 DIAGNOSIS — M25561 Pain in right knee: Secondary | ICD-10-CM

## 2021-12-14 DIAGNOSIS — J4541 Moderate persistent asthma with (acute) exacerbation: Secondary | ICD-10-CM

## 2021-12-14 LAB — SEDIMENTATION RATE: Sed Rate: 37 mm/hr — ABNORMAL HIGH (ref 0–20)

## 2021-12-14 LAB — BASIC METABOLIC PANEL
BUN: 8 mg/dL (ref 6–23)
CO2: 28 mEq/L (ref 19–32)
Calcium: 9.2 mg/dL (ref 8.4–10.5)
Chloride: 103 mEq/L (ref 96–112)
Creatinine, Ser: 0.85 mg/dL (ref 0.40–1.20)
GFR: 80.22 mL/min (ref >60.00)
Glucose, Bld: 101 mg/dL — ABNORMAL HIGH (ref 70–99)
Potassium: 4.2 mEq/L (ref 3.5–5.1)
Sodium: 138 mEq/L (ref 135–145)

## 2021-12-14 LAB — CBC
HCT: 41.2 % (ref 36.0–46.0)
Hemoglobin: 13.9 g/dL (ref 12.0–15.0)
MCHC: 33.6 g/dL (ref 30.0–36.0)
MCV: 85.2 fl (ref 78.0–100.0)
Platelets: 276 10*3/uL (ref 150.0–400.0)
RBC: 4.84 Mil/uL (ref 3.87–5.11)
RDW: 14.2 % (ref 11.5–15.5)
WBC: 7.4 10*3/uL (ref 4.0–10.5)

## 2021-12-14 LAB — HEMOGLOBIN A1C: Hgb A1c MFr Bld: 5.7 % (ref 4.6–6.5)

## 2021-12-14 LAB — C-REACTIVE PROTEIN: CRP: 1.1 mg/dL (ref 0.5–20.0)

## 2021-12-14 MED ORDER — ALBUTEROL SULFATE HFA 108 (90 BASE) MCG/ACT IN AERS: 2.0000 | INHALATION_SPRAY | Freq: Four times a day (QID) | RESPIRATORY_TRACT | 0 refills | Status: DC | PRN

## 2021-12-14 MED ORDER — TOPIRAMATE 25 MG PO TABS: 25.0000 mg | ORAL_TABLET | Freq: Every day | ORAL | 1 refills | Status: DC

## 2021-12-14 NOTE — Patient Instructions (Addendum)
A few things to remember from today's visit:   Right knee pain, unspecified chronicity - Plan: C-reactive protein, Sedimentation rate, CBC, DG Knee Complete 4 Views Right  Moderate persistent asthma with acute exacerbation - Plan: albuterol (VENTOLIN HFA) 108 (90 Base) MCG/ACT inhaler  Exposure to COVID-19 virus - Plan: albuterol (VENTOLIN HFA) 108 (90 Base) MCG/ACT inhaler  Dizziness - Plan: Hemoglobin Y6R, Basic metabolic panel  Headache, unspecified headache type  If you need refills please call your pharmacy. Do not use My Chart to request refills or for acute issues that need immediate attention.   Topamax at bedtime started today. Keep a headache diary. Low impact physical activity. Avoid asthma triggers factors. No change sin Symbicort or albuterol. Right knee pain could be arthritis. Continue Meloxicam daily as needed. Can also take Tylenol.  Please be sure medication list is accurate. If a new problem present, please set up appointment sooner than planned today.

## 2021-12-15 ENCOUNTER — Encounter: Payer: Self-pay | Admitting: Family Medicine

## 2022-01-14 ENCOUNTER — Ambulatory Visit: Payer: BC Managed Care – PPO | Admitting: Family Medicine

## 2022-01-14 NOTE — Progress Notes (Deleted)
HPI:  [name] is a 51 y.o. female, who is here today to follow on recent visit.  Review of Systems Rest see pertinent positives and negatives per HPI.  Current Outpatient Medications on File Prior to Visit  Medication Sig Dispense Refill   acetaminophen (TYLENOL) 500 MG tablet Take 500 mg by mouth every 8 (eight) hours as needed.     albuterol (VENTOLIN HFA) 108 (90 Base) MCG/ACT inhaler Inhale 2 puffs into the lungs every 6 (six) hours as needed for wheezing or shortness of breath. 18 g 0   AUVI-Q 0.3 MG/0.3ML SOAJ injection      budesonide-formoterol (SYMBICORT) 160-4.5 MCG/ACT inhaler Inhale 2 puffs into the lungs 2 (two) times daily. 1 each 6   levocetirizine (XYZAL) 5 MG tablet TAKE 1 TABLET BY MOUTH EVERY EVENING AS NEEDED, haven't started yet  5   meloxicam (MOBIC) 7.5 MG tablet TAKE 1 TABLET(7.5 MG) BY MOUTH DAILY AS NEEDED FOR PAIN 30 tablet 1   ondansetron (ZOFRAN) 4 MG tablet Take 1 tablet (4 mg total) by mouth every 8 (eight) hours as needed for nausea or vomiting. 20 tablet 0   pantoprazole (PROTONIX) 40 MG tablet TAKE 1 TABLET BY MOUTH DAILY 30 tablet 0   rizatriptan (MAXALT-MLT) 10 MG disintegrating tablet DISSOLVE 1 TABLET ON THE TONGUE AS NEEDED FOR MIGRAINE. MAY REPEAT IN 2 HOURS AS NEEDED 9 tablet 2   sertraline (ZOLOFT) 100 MG tablet Take 1.5 tablets (150 mg total) by mouth daily. 135 tablet 4   Spacer/Aero-Holding Chambers (AEROCHAMBER PLUS) inhaler Use as instructed to use with inahaler. 1 each 1   spironolactone (ALDACTONE) 100 MG tablet TAKE 1 TABLET (100 MG TOTAL) BY MOUTH 2 (TWO) TIMES DAILY. 180 tablet 4   topiramate (TOPAMAX) 25 MG tablet TAKE 1 TABLET(25 MG) BY MOUTH AT BEDTIME 90 tablet 0   No current facility-administered medications on file prior to visit.    Past Medical History:  Diagnosis Date   Abnormal Pap smear of cervix 1996   HPV +   Anxiety    Asthma    Chicken pox    Depression    Genital warts    GERD (gastroesophageal  reflux disease)    History of diverticulitis    History of genital warts    Hx: UTI (urinary tract infection)    Migraines    Urine incontinence    Allergies  Allergen Reactions   Cashew Nut Oil    Milk-Related Compounds Diarrhea   Wheat Bran    Cephalosporins Hives and Rash   Penicillins Hives and Rash    Social History   Socioeconomic History   Marital status: Married    Spouse name: Not on file   Number of children: 0   Years of education: Not on file   Highest education level: Not on file  Occupational History   Not on file  Tobacco Use   Smoking status: Never   Smokeless tobacco: Never  Vaping Use   Vaping Use: Never used  Substance and Sexual Activity   Alcohol use: No   Drug use: No   Sexual activity: Yes    Partners: Male  Other Topics Concern   Not on file  Social History Narrative   Not on file   Social Determinants of Health   Financial Resource Strain: Not on file  Food Insecurity: Not on file  Transportation Needs: Not on file  Physical Activity: Not on file  Stress: Not on file  Social  Connections: Not on file    There were no vitals filed for this visit. There is no height or weight on file to calculate BMI.  Physical Exam  ASSESSMENT AND PLAN:   There are no diagnoses linked to this encounter.  No orders of the defined types were placed in this encounter.   No problem-specific Assessment & Plan notes found for this encounter.   No follow-ups on file.   Betty G. Martinique, MD  [facility]. [place] office.

## 2022-02-01 ENCOUNTER — Other Ambulatory Visit: Payer: Self-pay | Admitting: Pulmonary Disease

## 2022-02-01 ENCOUNTER — Encounter: Payer: Self-pay | Admitting: Family Medicine

## 2022-02-01 DIAGNOSIS — K219 Gastro-esophageal reflux disease without esophagitis: Secondary | ICD-10-CM

## 2022-02-22 ENCOUNTER — Encounter: Payer: Self-pay | Admitting: Family Medicine

## 2022-02-25 ENCOUNTER — Ambulatory Visit: Payer: BC Managed Care – PPO | Admitting: Family Medicine

## 2022-02-25 ENCOUNTER — Encounter: Payer: Self-pay | Admitting: Family Medicine

## 2022-02-25 VITALS — BP 118/80 | HR 92 | Temp 98.0°F | Wt 305.0 lb

## 2022-02-25 DIAGNOSIS — L309 Dermatitis, unspecified: Secondary | ICD-10-CM | POA: Diagnosis not present

## 2022-02-25 DIAGNOSIS — J4520 Mild intermittent asthma, uncomplicated: Secondary | ICD-10-CM

## 2022-02-25 MED ORDER — METHYLPREDNISOLONE ACETATE 80 MG/ML IJ SUSP
80.0000 mg | Freq: Once | INTRAMUSCULAR | Status: AC
  Administered 2022-02-25: 80 mg via INTRAMUSCULAR

## 2022-02-25 MED ORDER — PREDNISONE 10 MG PO TABS: ORAL_TABLET | ORAL | 0 refills | Status: DC

## 2022-02-25 MED ORDER — ALBUTEROL SULFATE HFA 108 (90 BASE) MCG/ACT IN AERS: 2.0000 | INHALATION_SPRAY | Freq: Four times a day (QID) | RESPIRATORY_TRACT | 0 refills | Status: DC | PRN

## 2022-02-25 MED ORDER — METHYLPREDNISOLONE ACETATE 40 MG/ML IJ SUSP
40.0000 mg | Freq: Once | INTRAMUSCULAR | Status: AC
  Administered 2022-02-25: 40 mg via INTRAMUSCULAR

## 2022-02-25 NOTE — Progress Notes (Signed)
   Subjective:    Patient ID: Alexa West, female    DOB: 1971-03-07, 50 y.o.   MRN: [id number]  HPI Here for an itchy rash all over the body that started 3 weeks ago. No change in her usual routine. She began to get spots on her arm, and then it spread to legs and trunk. She has been applying Benadryl gel and taking oral Benadryl. She has never had this before. No SOB or wheezing.    Review of Systems  Constitutional: Negative.   Respiratory: Negative.    Cardiovascular: Negative.   Skin:  Positive for rash.       Objective:   Physical Exam Constitutional:      General: She is not in acute distress.    Appearance: Normal appearance.  Cardiovascular:     Rate and Rhythm: Normal rate and regular rhythm.     Pulses: Normal pulses.     Heart sounds: Normal heart sounds.  Pulmonary:     Effort: Pulmonary effort is normal.     Breath sounds: Normal breath sounds.  Skin:    Comments: Widespread patches of red, maculopapular skin over trunk and extremities. Many of these are excoriated   Neurological:     Mental Status: She is alert.           Assessment & Plan:  Eczema. She is given a shot of DepoMedrol today. Beginning tomorrow she will start a Prednisone taper for 16 days, starting at 40 mg a day. Recheck as needed.  Alysia Penna, MD

## 2022-02-25 NOTE — Addendum Note (Signed)
Addended by: Wyvonne Lenz on: 02/25/2022 02:55 PM   Modules accepted: Orders

## 2022-03-06 ENCOUNTER — Encounter (HOSPITAL_BASED_OUTPATIENT_CLINIC_OR_DEPARTMENT_OTHER): Payer: Self-pay | Admitting: Emergency Medicine

## 2022-03-06 ENCOUNTER — Emergency Department (HOSPITAL_BASED_OUTPATIENT_CLINIC_OR_DEPARTMENT_OTHER)
Admission: EM | Admit: 2022-03-06 | Discharge: 2022-03-06 | Disposition: A | Discharge status: Home / Self Care | Payer: BC Managed Care – PPO | Attending: Emergency Medicine | Admitting: Emergency Medicine

## 2022-03-06 DIAGNOSIS — K625 Hemorrhage of anus and rectum: Secondary | ICD-10-CM | POA: Diagnosis not present

## 2022-03-06 DIAGNOSIS — K219 Gastro-esophageal reflux disease without esophagitis: Secondary | ICD-10-CM

## 2022-03-06 LAB — COMPREHENSIVE METABOLIC PANEL
ALT: 35 U/L (ref 0–44)
AST: 37 U/L (ref 15–41)
Albumin: 4.1 g/dL (ref 3.5–5.0)
Alkaline Phosphatase: 71 U/L (ref 38–126)
Anion gap: 12 (ref 5–15)
BUN: 15 mg/dL (ref 6–20)
CO2: 23 mmol/L (ref 22–32)
Calcium: 9.2 mg/dL (ref 8.9–10.3)
Chloride: 101 mmol/L (ref 98–111)
Creatinine, Ser: 0.81 mg/dL (ref 0.44–1.00)
GFR, Estimated: >60 mL/min (ref >60)
Glucose, Bld: 147 mg/dL — ABNORMAL HIGH (ref 70–99)
Potassium: 3.8 mmol/L (ref 3.5–5.1)
Sodium: 136 mmol/L (ref 135–145)
Total Bilirubin: 0.6 mg/dL (ref 0.3–1.2)
Total Protein: 7.3 g/dL (ref 6.5–8.1)

## 2022-03-06 LAB — CBC WITH DIFFERENTIAL/PLATELET
Abs Immature Granulocytes: 0.04 10*3/uL (ref 0.00–0.07)
Abs Immature Granulocytes: 0.05 10*3/uL (ref 0.00–0.07)
Basophils Absolute: 0 10*3/uL (ref 0.0–0.1)
Basophils Absolute: 0 10*3/uL (ref 0.0–0.1)
Basophils Relative: 0 %
Basophils Relative: 0 %
Eosinophils Absolute: 0 10*3/uL (ref 0.0–0.5)
Eosinophils Absolute: 0.1 10*3/uL (ref 0.0–0.5)
Eosinophils Relative: 0 %
Eosinophils Relative: 1 %
HCT: 44.1 % (ref 36.0–46.0)
HCT: 42 % (ref 36.0–46.0)
Hemoglobin: 14.5 g/dL (ref 12.0–15.0)
Hemoglobin: 13.8 g/dL (ref 12.0–15.0)
Immature Granulocytes: 0 %
Immature Granulocytes: 1 %
Lymphocytes Relative: 19 %
Lymphocytes Relative: 25 %
Lymphs Abs: 1.7 10*3/uL (ref 0.7–4.0)
Lymphs Abs: 2.6 10*3/uL (ref 0.7–4.0)
MCH: 27.8 pg (ref 26.0–34.0)
MCH: 28 pg (ref 26.0–34.0)
MCHC: 32.9 g/dL (ref 30.0–36.0)
MCHC: 32.9 g/dL (ref 30.0–36.0)
MCV: 84.6 fL (ref 80.0–100.0)
MCV: 85.2 fL (ref 80.0–100.0)
Monocytes Absolute: 0.4 10*3/uL (ref 0.1–1.0)
Monocytes Absolute: 0.9 10*3/uL (ref 0.1–1.0)
Monocytes Relative: 4 %
Monocytes Relative: 8 %
Neutro Abs: 7 10*3/uL (ref 1.7–7.7)
Neutro Abs: 6.8 10*3/uL (ref 1.7–7.7)
Neutrophils Relative %: 77 %
Neutrophils Relative %: 65 %
Platelets: 333 10*3/uL (ref 150–400)
Platelets: 298 10*3/uL (ref 150–400)
RBC: 5.21 MIL/uL — ABNORMAL HIGH (ref 3.87–5.11)
RBC: 4.93 MIL/uL (ref 3.87–5.11)
RDW: 13.2 % (ref 11.5–15.5)
RDW: 13.2 % (ref 11.5–15.5)
WBC: 9.1 10*3/uL (ref 4.0–10.5)
WBC: 10.5 10*3/uL (ref 4.0–10.5)
nRBC: 0 % (ref 0.0–0.2)
nRBC: 0 % (ref 0.0–0.2)

## 2022-03-06 LAB — PROTIME-INR
INR: 1.1 (ref 0.8–1.2)
Prothrombin Time: 13.7 seconds (ref 11.4–15.2)

## 2022-03-06 LAB — OCCULT BLOOD X 1 CARD TO LAB, STOOL: Fecal Occult Bld: POSITIVE — AB

## 2022-03-06 LAB — APTT: aPTT: 28 seconds (ref 24–36)

## 2022-03-06 MED ORDER — PANTOPRAZOLE SODIUM 40 MG PO TBEC
40.0000 mg | DELAYED_RELEASE_TABLET | Freq: Every day | ORAL | 0 refills | Status: DC
  Filled 2022-03-06: qty 30, 30d supply, fill #0

## 2022-03-06 MED ORDER — PANTOPRAZOLE SODIUM 40 MG IV SOLR
40.0000 mg | INTRAVENOUS | Status: AC
  Administered 2022-03-06: 40 mg via INTRAVENOUS
  Filled 2022-03-06: qty 10

## 2022-03-06 NOTE — ED Triage Notes (Signed)
Pt here from work with c/o rectal bleed  started around 3 pm when she went to the bathroom , hx of gi bleed 3 years ago

## 2022-03-06 NOTE — Discharge Instructions (Addendum)
Today you were seen in the emergency department for your rectal bleeding.    In the emergency department you had lab work done that was reassuring. You were monitored in the ED and were stable without additional bleeding.    At home, please STOP taking your prednisone and omeprazole. Start taking the protonix we have prescribed you.     Check your MyChart online for the results of any tests that had not resulted by the time you left the emergency department.   Follow-up with your primary doctor in 2-3 days regarding your visit. GI will also be calling you to set up an appointment as well.   Return immediately to the emergency department if you experience any of the following: worsening bleeding, light headedness, shortness of breath, or any other concerning symptoms.    Thank you for visiting our Emergency Department. It was a pleasure taking care of you today.

## 2022-03-06 NOTE — ED Provider Notes (Signed)
[name] Provider Note   CSN: [id number] Arrival date & time: 03/06/22  1626     History {Add pertinent medical, surgical, social history, OB history to HPI:1} Chief Complaint  Patient presents with   GI Bleeding    Alexa West is a 51 y.o. female.  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 States that she had a blood streaked BM earlier today. Says that she had a second one where she noticed blood in the toilet bowel. Did feel light headed earlier but is not now. No SOB or CP. Last EGD was in 2018 when she had a GIB. Does not believe she was transfused blood. No heavy ETOH or NSAID use recently. No blood thinners or aspirin. Is on a 16 day course of steroids for a rash on her arms and legs. Started her taper on Tuesday. Tomorrow will transition to 20 mg for 4 days. Also on omeprazole 20 mg daily.        Home Medications Prior to Admission medications   Medication Sig Start Date End Date Taking? Authorizing Provider  acetaminophen (TYLENOL) 500 MG tablet Take 500 mg by mouth every 8 (eight) hours as needed.    [provider]  albuterol (VENTOLIN HFA) 108 (90 Base) MCG/ACT inhaler Inhale 2 puffs into the lungs every 6 (six) hours as needed for wheezing or shortness of breath. 02/25/22   Laurey Morale, MD  AUVI-Q 0.3 MG/0.3ML SOAJ injection  01/27/18   [provider]  budesonide-formoterol (SYMBICORT) 160-4.5 MCG/ACT inhaler Inhale 2 puffs into the lungs 2 (two) times daily. 01/22/21   Freddi Starr, MD  levocetirizine (XYZAL) 5 MG tablet TAKE 1 TABLET BY MOUTH EVERY EVENING AS NEEDED, haven't started yet 01/15/18   [provider]  meloxicam (MOBIC) 7.5 MG tablet TAKE 1 TABLET(7.5 MG) BY MOUTH DAILY AS NEEDED FOR PAIN 08/14/21   Martinique, Betty G, MD  ondansetron (ZOFRAN) 4 MG tablet Take 1 tablet (4 mg total) by mouth every 8 (eight) hours as needed for nausea or vomiting.  04/24/20   Megan Salon, MD  pantoprazole (PROTONIX) 40 MG tablet TAKE 1 TABLET BY MOUTH DAILY 10/29/21   Freddi Starr, MD  predniSONE (DELTASONE) 10 MG tablet Take 4 tabs a day for 4 days, then 3 tabs a day for 4 days, then 2 tabs a day for 4 days, then 1 tab a day for 4 days 02/25/22   Laurey Morale, MD  rizatriptan (MAXALT-MLT) 10 MG disintegrating tablet DISSOLVE 1 TABLET ON THE TONGUE AS NEEDED FOR MIGRAINE. MAY REPEAT IN 2 HOURS AS NEEDED 10/10/21   Martinique, Betty G, MD  sertraline (ZOLOFT) 100 MG tablet Take 1.5 tablets (150 mg total) by mouth daily. 04/07/20   Megan Salon, MD  Spacer/Aero-Holding Chambers (AEROCHAMBER PLUS) inhaler Use as instructed to use with inahaler. 09/17/19   Martinique, Betty G, MD  spironolactone (ALDACTONE) 100 MG tablet TAKE 1 TABLET (100 MG TOTAL) BY MOUTH 2 (TWO) TIMES DAILY. 06/25/21 06/25/22  Megan Salon, MD  topiramate (TOPAMAX) 25 MG tablet TAKE 1 TABLET(25 MG) BY MOUTH AT BEDTIME 12/14/21   Martinique, Betty G, MD      Allergies    Cashew nut oil, Milk-related compounds, Wheat bran, Cephalosporins, and Penicillins    Review of Systems   Review of Systems  Physical Exam Updated Vital Signs BP (!) 156/91   Pulse (!) 56  Temp 98.4 F (36.9 C)   Resp (!) 21   SpO2 97%  Physical Exam  ED Results / Procedures / Treatments   Labs (all labs ordered are listed, but only abnormal results are displayed) Labs Reviewed  COMPREHENSIVE METABOLIC PANEL - Abnormal; Notable for the following components:      Result Value   Glucose, Bld 147 (*)    All other components within normal limits  CBC WITH DIFFERENTIAL/PLATELET - Abnormal; Notable for the following components:   RBC 5.21 (*)    All other components within normal limits  OCCULT BLOOD X 1 CARD TO LAB, STOOL    EKG None  Radiology No results found.  Procedures Procedures  {Document cardiac monitor, telemetry assessment procedure when appropriate:1}  Medications Ordered in ED Medications -  No data to display  ED Course/ Medical Decision Making/ A&P                           Medical Decision Making Amount and/or Complexity of Data Reviewed Labs: ordered.   ***  {Document critical care time when appropriate:1} {Document review of labs and clinical decision tools ie heart score, Chads2Vasc2 etc:1}  {Document your independent review of radiology images, and any outside records:1} {Document your discussion with family members, caretakers, and with consultants:1} {Document social determinants of health affecting pt's care:1} {Document your decision making why or why not admission, treatments were needed:1} Final Clinical Impression(s) / ED Diagnoses Final diagnoses:  None    Rx / DC Orders ED Discharge Orders     None

## 2022-03-07 ENCOUNTER — Other Ambulatory Visit (HOSPITAL_COMMUNITY): Payer: Self-pay

## 2022-04-08 NOTE — Progress Notes (Addendum)
Alexa West D.[place] Phone: [phone]   Assessment and Plan:     1. Right hip pain 2. Greater trochanteric bursitis of right hip 3. Osteoarthritis of right hip, unspecified osteoarthritis type -Chronic with exacerbation, initial sports medicine visit - Flare of right hip pain most consistent with greater trochanteric bursitis on today's physical exam, the patient has underlying mild osteoarthritis of right hip which may have been contributing to patient's pain over the past several years - Patient elected for greater trochanteric CSI.  Tolerated well per note below.  CSI may temporarily increase blood glucose with patient having past medical history of prediabetes - Start HEP for gluteal musculature - X-ray obtained in clinic.  Monitor rotation: No acute fracture or dislocation.  Mildly decreased femoral acetabular joint space, right greater than left.  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  Work note provided for yesterday and today.  Patient may return to work tomorrow.   Procedure: Greater trochanteric bursal injection Side: Right  Risks explained and consent was given verbally. The site was cleaned with alcohol prep. A steroid injection was performed with patient in the lateral side-lying position at area of maximum tenderness over greater trochanter using 75m of 1% lidocaine without epinephrine and 135mof kenalog '40mg'$ /ml. This was well tolerated and resulted in symptomatic relief.  Needle was removed, hemostasis achieved, and post injection instructions were explained.  Pt was advised to call or return to clinic if these symptoms worsen or fail to improve as anticipated.    Pertinent previous records reviewed include none   Follow Up: 2 to 3 weeks for  reevaluation   Subjective:   I, Alexa West, am serving as a scEducation administratoror Doctor BeGlennon MacChief Complaint: right hip pain   HPI:   04/09/22 Patient is a [age]ear old female complaining of right hip pain. Patient states that she has had pain for a very long time, 3 weeks ago she laid on a low bed and ever since then she has had pain , the last couple of days she has had trouble walking , sitting down and the process of getting up causes pain, no meds but took a Vicodin Sunday night with two mobics with it a total of 15 mg, does have radiating pain in the hip that went up the back and down the leg but generally the pain stays in the hip, hip doesn't feel like its going to give out on her , does note that she has been having leg cramps more often as of late.   Relevant Historical Information: Prediabetes, PCOS, currently undergoing work-up for rectal bleeding  Additional pertinent review of systems negative.   Current Outpatient Medications:    acetaminophen (TYLENOL) 500 MG tablet, Take 500 mg by mouth every 8 (eight) hours as needed., Disp: , Rfl:    albuterol (VENTOLIN HFA) 108 (90 Base) MCG/ACT inhaler, Inhale 2 puffs into the lungs every 6 (six) hours as needed for wheezing or shortness of breath., Disp: 18 g, Rfl: 0   AUVI-Q 0.3 MG/0.3ML SOAJ injection, , Disp: , Rfl:    budesonide-formoterol (SYMBICORT) 160-4.5 MCG/ACT inhaler, Inhale 2 puffs into the lungs 2 (two) times daily., Disp: 1 each, Rfl: 6   levocetirizine (  XYZAL) 5 MG tablet, TAKE 1 TABLET BY MOUTH EVERY EVENING AS NEEDED, haven't started yet, Disp: , Rfl: 5   meloxicam (MOBIC) 7.5 MG tablet, TAKE 1 TABLET(7.5 MG) BY MOUTH DAILY AS NEEDED FOR PAIN, Disp: 30 tablet, Rfl: 1   ondansetron (ZOFRAN) 4 MG tablet, Take 1 tablet (4 mg total) by mouth every 8 (eight) hours as needed for nausea or vomiting., Disp: 20 tablet, Rfl: 0   pantoprazole (PROTONIX) 40 MG tablet, Take 1 tablet (40 mg total) by mouth daily., Disp: 30  tablet, Rfl: 0   rizatriptan (MAXALT-MLT) 10 MG disintegrating tablet, DISSOLVE 1 TABLET ON THE TONGUE AS NEEDED FOR MIGRAINE. MAY REPEAT IN 2 HOURS AS NEEDED, Disp: 9 tablet, Rfl: 2   sertraline (ZOLOFT) 100 MG tablet, Take 1.5 tablets (150 mg total) by mouth daily., Disp: 135 tablet, Rfl: 4   Spacer/Aero-Holding Chambers (AEROCHAMBER PLUS) inhaler, Use as instructed to use with inahaler., Disp: 1 each, Rfl: 1   spironolactone (ALDACTONE) 100 MG tablet, TAKE 1 TABLET (100 MG TOTAL) BY MOUTH 2 (TWO) TIMES DAILY., Disp: 180 tablet, Rfl: 4   topiramate (TOPAMAX) 25 MG tablet, TAKE 1 TABLET(25 MG) BY MOUTH AT BEDTIME, Disp: 90 tablet, Rfl: 0   Objective:     Vitals:   04/09/22 0822  Pulse: 71  SpO2: 97%  Weight: (!) 309 lb (140.2 kg)  Height: '5\' 5"'$  (1.651 m)      Body mass index is 51.42 kg/m.    Physical Exam:    General: awake, alert, and oriented no acute distress, nontoxic Skin: no suspicious lesions or rashes Neuro:sensation intact distally with no dificits, normal muscle tone, no atrophy, strength 5/5 in all tested lower ext groups Psych: normal mood and affect, speech clear  Right hip: No deformity, swelling or wasting ROM Flexion 90, ext 15, IR 35, ER 40 TTP greater trochanter, gluteal musculature, IT band NTTP over the hip flexors,   si joint, lumbar spine Negative log roll with FROM Negative FABER Positive FADIR for lateral hip pain Negative Piriformis test, though caused hamstring and gluteal tightness   Gait normal    Electronically signed by:  Alexa West D.[facility] 8:49 AM 04/09/22

## 2022-04-09 ENCOUNTER — Ambulatory Visit (INDEPENDENT_AMBULATORY_CARE_PROVIDER_SITE_OTHER): Payer: BC Managed Care – PPO

## 2022-04-09 ENCOUNTER — Ambulatory Visit: Payer: BC Managed Care – PPO | Admitting: Sports Medicine

## 2022-04-09 VITALS — HR 71 | Ht 65.0 in | Wt 309.0 lb

## 2022-04-09 DIAGNOSIS — M25551 Pain in right hip: Secondary | ICD-10-CM

## 2022-04-09 DIAGNOSIS — M7061 Trochanteric bursitis, right hip: Secondary | ICD-10-CM | POA: Diagnosis not present

## 2022-04-09 DIAGNOSIS — M1611 Unilateral primary osteoarthritis, right hip: Secondary | ICD-10-CM

## 2022-04-09 NOTE — Patient Instructions (Addendum)
Good to see you  Glute HEP Work note provided  2-3 week follow up

## 2022-04-11 NOTE — Progress Notes (Signed)
Alexa West D.[place] Phone: [phone]   Assessment and Plan:    1. Right hip pain 2. Osteoarthritis of right hip, unspecified osteoarthritis type 3. Gastroesophageal reflux disease without esophagitis -Chronic with exacerbation, subsequent visit - Significant improvement in greater trochanteric bursitis after bursal CSI performed on 04/09/2022, however patient continues to experience tightness in gluteal muscular, hamstrings, low back - Continue hip HEP and start low back HEP - Start meloxicam 15 mg daily x2 weeks.  If still having pain after 2 weeks, complete 3rd-week of meloxicam. May use remaining meloxicam as needed once daily for pain control.  Do not to use additional NSAIDs while taking meloxicam.  May use Tylenol [phone] mg 2 to 3 times a day for breakthrough pain. - With past medical history of GI bleed, I do not recommend prolonged use of NSAIDs.  Plan only using meloxicam for 2 to 3 weeks maximum, and using Protonix and food daily with meloxicam - pantoprazole (PROTONIX) 40 MG tablet; Take 1 tablet (40 mg total) by mouth daily.    Pertinent previous records reviewed include none   Follow Up: 3 to 4 weeks for reevaluation.  Could consider lumbar imaging if back pain persists.  Could consider intra-articular hip CSI based on symptoms and physical exam   Subjective:   I, Alexa West, am serving as a Education administrator for Doctor Glennon Mac   Chief Complaint: right hip pain    HPI:    04/09/22 Patient is a 51 year old female complaining of right hip pain. Patient states that she has had pain for a very long time, 3 weeks ago she laid on a low bed and ever since then she has had pain , the last couple of days she has had trouble walking , sitting down and the process of getting up causes pain, no meds but took a Vicodin Sunday night with two mobics with it a total of 15 mg, does have radiating pain  in the hip that went up the back and down the leg but generally the pain stays in the hip, hip doesn't feel like its going to give out on her , does note that she has been having leg cramps more often as of late.   04/23/2022 Patient states states that she is okay her hip feels a little better the pain feels like it is moving up some but the last 4-5 days have been better still hurts when she bends over and she tried HEP but it was to painful   Relevant Historical Information: Prediabetes, PCOS, currently undergoing work-up for rectal bleeding  Additional pertinent review of systems negative.   Current Outpatient Medications:    acetaminophen (TYLENOL) 500 MG tablet, Take 500 mg by mouth every 8 (eight) hours as needed., Disp: , Rfl:    albuterol (VENTOLIN HFA) 108 (90 Base) MCG/ACT inhaler, Inhale 2 puffs into the lungs every 6 (six) hours as needed for wheezing or shortness of breath., Disp: 18 g, Rfl: 0   AUVI-Q 0.3 MG/0.3ML SOAJ injection, , Disp: , Rfl:    budesonide-formoterol (SYMBICORT) 160-4.5 MCG/ACT inhaler, Inhale 2 puffs into the lungs 2 (two) times daily., Disp: 1 each, Rfl: 6   levocetirizine (XYZAL) 5 MG tablet, TAKE 1 TABLET BY MOUTH EVERY EVENING AS NEEDED, haven't started yet, Disp: , Rfl: 5   ondansetron (ZOFRAN) 4 MG tablet, Take 1 tablet (4 mg total) by mouth every 8 (eight) hours as needed  for nausea or vomiting., Disp: 20 tablet, Rfl: 0   pantoprazole (PROTONIX) 40 MG tablet, Take 1 tablet (40 mg total) by mouth daily., Disp: 30 tablet, Rfl: 0   rizatriptan (MAXALT-MLT) 10 MG disintegrating tablet, DISSOLVE 1 TABLET ON THE TONGUE AS NEEDED FOR MIGRAINE. MAY REPEAT IN 2 HOURS AS NEEDED, Disp: 9 tablet, Rfl: 2   sertraline (ZOLOFT) 100 MG tablet, Take 1.5 tablets (150 mg total) by mouth daily., Disp: 135 tablet, Rfl: 4   Spacer/Aero-Holding Chambers (AEROCHAMBER PLUS) inhaler, Use as instructed to use with inahaler., Disp: 1 each, Rfl: 1   spironolactone (ALDACTONE) 100 MG  tablet, TAKE 1 TABLET (100 MG TOTAL) BY MOUTH 2 (TWO) TIMES DAILY., Disp: 180 tablet, Rfl: 4   topiramate (TOPAMAX) 25 MG tablet, TAKE 1 TABLET(25 MG) BY MOUTH AT BEDTIME, Disp: 90 tablet, Rfl: 0   meloxicam (MOBIC) 15 MG tablet, Take 1 tablet (15 mg total) by mouth daily., Disp: 30 tablet, Rfl: 0   Objective:     Vitals:   04/23/22 0804  BP: 120/81  Pulse: 76  SpO2: 97%  Weight: (!) 309 lb (140.2 kg)  Height: '5\' 5"'$  (1.651 m)      Body mass index is 51.42 kg/m.    Physical Exam:    General: awake, alert, and oriented no acute distress, nontoxic Skin: no suspicious lesions or rashes Neuro:sensation intact distally with no dificits, normal muscle tone, no atrophy, strength 5/5 in all tested lower ext groups Psych: normal mood and affect, speech clear  Right hip: No deformity, swelling or wasting ROM Flexion 90, ext 30, IR 45, ER 45 TTP lumbar paraspinal muscles worse on right, gluteal musculature NTTP over the hip flexors, greater troch,   Negative log roll with FROM Negative FABER Positive FADIR for lateral hip pain Negative Piriformis test, though caused hamstring and gluteal tightness   Gait normal    Electronically signed by:  Alexa West D.[facility] 8:27 AM 04/23/22

## 2022-04-18 DIAGNOSIS — K581 Irritable bowel syndrome with constipation: Secondary | ICD-10-CM | POA: Diagnosis not present

## 2022-04-18 DIAGNOSIS — K625 Hemorrhage of anus and rectum: Secondary | ICD-10-CM | POA: Diagnosis not present

## 2022-04-23 ENCOUNTER — Ambulatory Visit: Payer: BC Managed Care – PPO | Admitting: Sports Medicine

## 2022-04-23 ENCOUNTER — Other Ambulatory Visit: Payer: Self-pay | Admitting: Sports Medicine

## 2022-04-23 VITALS — BP 120/81 | HR 76 | Ht 65.0 in | Wt 309.0 lb

## 2022-04-23 DIAGNOSIS — K219 Gastro-esophageal reflux disease without esophagitis: Secondary | ICD-10-CM

## 2022-04-23 DIAGNOSIS — M25551 Pain in right hip: Secondary | ICD-10-CM

## 2022-04-23 DIAGNOSIS — K5900 Constipation, unspecified: Secondary | ICD-10-CM | POA: Diagnosis not present

## 2022-04-23 DIAGNOSIS — M1611 Unilateral primary osteoarthritis, right hip: Secondary | ICD-10-CM

## 2022-04-23 MED ORDER — MELOXICAM 15 MG PO TABS: 15.0000 mg | ORAL_TABLET | Freq: Every day | ORAL | 0 refills | Status: DC

## 2022-04-23 MED ORDER — PANTOPRAZOLE SODIUM 40 MG PO TBEC: 40.0000 mg | DELAYED_RELEASE_TABLET | Freq: Every day | ORAL | 0 refills | Status: DC

## 2022-04-23 NOTE — Patient Instructions (Addendum)
Good to see you  - Start meloxicam 15 mg daily x2 weeks.  If still having pain after 2 weeks, complete 3rd-week of meloxicam. May use remaining meloxicam as needed once daily for pain control.  Do not to use additional NSAIDs while taking meloxicam.  May use Tylenol [phone] mg 2 to 3 times a day for breakthrough pain. Start Protonix 40 mg daily taken with meloxicam Continue hip HEP  Start low back HEP 3-4 week follow up

## 2022-05-13 NOTE — Progress Notes (Deleted)
Benito Mccreedy D.[place] Phone: [phone]   Assessment and Plan:     There are no diagnoses linked to this encounter.  ***   Pertinent previous records reviewed include ***   Follow Up: ***     Subjective:   I, Malyn Aytes, am serving as a Education administrator for Doctor Glennon Mac   Chief Complaint: right hip pain    HPI:    04/09/22 Patient is a 51 year old female complaining of right hip pain. Patient states that she has had pain for a very long time, 3 weeks ago she laid on a low bed and ever since then she has had pain , the last couple of days she has had trouble walking , sitting down and the process of getting up causes pain, no meds but took a Vicodin Sunday night with two mobics with it a total of 15 mg, does have radiating pain in the hip that went up the back and down the leg but generally the pain stays in the hip, hip doesn't feel like its going to give out on her , does note that she has been having leg cramps more often as of late.    04/23/2022 Patient states states that she is okay her hip feels a little better the pain feels like it is moving up some but the last 4-5 days have been better still hurts when she bends over and she tried HEP but it was to painful   10/17/20223 Patient states   Relevant Historical Information: Prediabetes, PCOS, currently undergoing work-up for rectal bleeding  Additional pertinent review of systems negative.   Current Outpatient Medications:    acetaminophen (TYLENOL) 500 MG tablet, Take 500 mg by mouth every 8 (eight) hours as needed., Disp: , Rfl:    albuterol (VENTOLIN HFA) 108 (90 Base) MCG/ACT inhaler, Inhale 2 puffs into the lungs every 6 (six) hours as needed for wheezing or shortness of breath., Disp: 18 g, Rfl: 0   AUVI-Q 0.3 MG/0.3ML SOAJ injection, , Disp: , Rfl:    budesonide-formoterol (SYMBICORT) 160-4.5 MCG/ACT inhaler, Inhale 2 puffs into  the lungs 2 (two) times daily., Disp: 1 each, Rfl: 6   levocetirizine (XYZAL) 5 MG tablet, TAKE 1 TABLET BY MOUTH EVERY EVENING AS NEEDED, haven't started yet, Disp: , Rfl: 5   meloxicam (MOBIC) 15 MG tablet, Take 1 tablet (15 mg total) by mouth daily., Disp: 30 tablet, Rfl: 0   ondansetron (ZOFRAN) 4 MG tablet, Take 1 tablet (4 mg total) by mouth every 8 (eight) hours as needed for nausea or vomiting., Disp: 20 tablet, Rfl: 0   pantoprazole (PROTONIX) 40 MG tablet, Take 1 tablet (40 mg total) by mouth daily., Disp: 30 tablet, Rfl: 0   rizatriptan (MAXALT-MLT) 10 MG disintegrating tablet, DISSOLVE 1 TABLET ON THE TONGUE AS NEEDED FOR MIGRAINE. MAY REPEAT IN 2 HOURS AS NEEDED, Disp: 9 tablet, Rfl: 2   sertraline (ZOLOFT) 100 MG tablet, Take 1.5 tablets (150 mg total) by mouth daily., Disp: 135 tablet, Rfl: 4   Spacer/Aero-Holding Chambers (AEROCHAMBER PLUS) inhaler, Use as instructed to use with inahaler., Disp: 1 each, Rfl: 1   spironolactone (ALDACTONE) 100 MG tablet, TAKE 1 TABLET (100 MG TOTAL) BY MOUTH 2 (TWO) TIMES DAILY., Disp: 180 tablet, Rfl: 4   topiramate (TOPAMAX) 25 MG tablet, TAKE 1 TABLET(25 MG) BY MOUTH AT BEDTIME, Disp: 90 tablet, Rfl: 0   Objective:  There were no vitals filed for this visit.    There is no height or weight on file to calculate BMI.    Physical Exam:    ***   Electronically signed by:  Benito Mccreedy D.[facility] 10:55 AM 05/13/22

## 2022-05-14 ENCOUNTER — Ambulatory Visit: Payer: BC Managed Care – PPO | Admitting: Sports Medicine

## 2022-05-15 ENCOUNTER — Encounter: Payer: Self-pay | Admitting: Family Medicine

## 2022-05-20 NOTE — Progress Notes (Signed)
ACUTE VISIT Chief Complaint  Patient presents with   Urinary Tract Infection   unable to focus   HPI: [name] is a 51 y.o. female with history of prediabetes, PCOS, migraine headaches, generalized OA, depression, anxiety, and IBS here today complaining of urinary symptoms as described above. Urinary frequency and nocturia for years, stable.  Hematuria This is a new problem. The current episode started in the past 7 days. The problem has been waxing and waning since onset. She describes the hematuria as gross hematuria. The hematuria occurs throughout her entire urinary stream. She reports no clotting in her urine stream. The pain is moderate. She describes her urine color as light pink. Irritative symptoms include frequency, nocturia and urgency. Obstructive symptoms do not include dribbling, incomplete emptying, an intermittent stream, a slower stream, straining or a weak stream. Associated symptoms include chills, dysuria and flank pain (Left lower back pain for a couple days.). Pertinent negatives include no bone pain, facial swelling, fever, genital pain, hesitancy, inability to urinate, nausea, urinary retention, vomiting or weight loss. She is not sexually active. Her past medical history is significant for kidney stones. There is no history of hypertension, STDs or tobacco use.  She has some abdominal pain, intermittently, no more than usual and attributed to IBS. Past history of nephrolithiasis. She is not drinking enough water, she usually drinks sweet tea.  She is also concerned about problems with concentration and focusing, sometimes at home she is not able to complete task, she gets easily distracted.  This is not affecting her job. No past history of ADHD/ADD. Anxiety and depression being managed by her gynecologist, currently she is on sertraline 150 mg daily. She reports her depression as stable. Denies suicidal thoughts. She has seen psychiatrist in the past  and states that she did not interested in going back. Sleeping about 5 hours.  Prediabetes: Negative for polyphagia. Lab Results  Component Value Date   HGBA1C 5.7 12/14/2021   03/06/2022 glucose was elevated at 147.  Review of Systems  Constitutional:  Positive for chills. Negative for fever and weight loss.  HENT:  Negative for facial swelling.   Gastrointestinal:  Negative for nausea and vomiting.       No changes in bowel habits.  Genitourinary:  Positive for dysuria, flank pain (Left lower back pain for a couple days.), frequency, hematuria, nocturia and urgency. Negative for hesitancy and incomplete emptying.  Musculoskeletal:  Positive for arthralgias and back pain.  Skin:  Negative for rash.  Neurological:  Negative for syncope and weakness.  Psychiatric/Behavioral:  Negative for confusion and hallucinations. The patient is nervous/anxious.   Rest see pertinent positives and negatives per HPI.  Current Outpatient Medications on File Prior to Visit  Medication Sig Dispense Refill   acetaminophen (TYLENOL) 500 MG tablet Take 500 mg by mouth every 8 (eight) hours as needed.     albuterol (VENTOLIN HFA) 108 (90 Base) MCG/ACT inhaler Inhale 2 puffs into the lungs every 6 (six) hours as needed for wheezing or shortness of breath. 18 g 0   AUVI-Q 0.3 MG/0.3ML SOAJ injection      budesonide-formoterol (SYMBICORT) 160-4.5 MCG/ACT inhaler Inhale 2 puffs into the lungs 2 (two) times daily. 1 each 6   levocetirizine (XYZAL) 5 MG tablet TAKE 1 TABLET BY MOUTH EVERY EVENING AS NEEDED, haven't started yet  5   meloxicam (MOBIC) 15 MG tablet Take 1 tablet (15 mg total) by mouth daily. 30 tablet 0   ondansetron (ZOFRAN) 4  MG tablet Take 1 tablet (4 mg total) by mouth every 8 (eight) hours as needed for nausea or vomiting. 20 tablet 0   pantoprazole (PROTONIX) 40 MG tablet Take 1 tablet (40 mg total) by mouth daily. 30 tablet 0   rizatriptan (MAXALT-MLT) 10 MG disintegrating tablet DISSOLVE 1  TABLET ON THE TONGUE AS NEEDED FOR MIGRAINE. MAY REPEAT IN 2 HOURS AS NEEDED 9 tablet 2   sertraline (ZOLOFT) 100 MG tablet Take 1.5 tablets (150 mg total) by mouth daily. 135 tablet 4   Spacer/Aero-Holding Chambers (AEROCHAMBER PLUS) inhaler Use as instructed to use with inahaler. 1 each 1   spironolactone (ALDACTONE) 100 MG tablet TAKE 1 TABLET (100 MG TOTAL) BY MOUTH 2 (TWO) TIMES DAILY. 180 tablet 4   topiramate (TOPAMAX) 25 MG tablet TAKE 1 TABLET(25 MG) BY MOUTH AT BEDTIME 90 tablet 0   No current facility-administered medications on file prior to visit.   Past Medical History:  Diagnosis Date   Abnormal Pap smear of cervix 1996   HPV +   Anxiety    Asthma    Chicken pox    Depression    Genital warts    GERD (gastroesophageal reflux disease)    History of diverticulitis    History of genital warts    Hx: UTI (urinary tract infection)    Migraines    Urine incontinence    Allergies  Allergen Reactions   Cashew Nut Oil    Milk-Related Compounds Diarrhea   Wheat Bran    Cephalosporins Hives and Rash   Penicillins Hives and Rash   Social History   Socioeconomic History   Marital status: Married    Spouse name: Not on file   Number of children: 0   Years of education: Not on file   Highest education level: Associate degree: occupational, Hotel manager, or vocational program  Occupational History   Not on file  Tobacco Use   Smoking status: Never   Smokeless tobacco: Never  Vaping Use   Vaping Use: Never used  Substance and Sexual Activity   Alcohol use: No   Drug use: No   Sexual activity: Yes    Partners: Male  Other Topics Concern   Not on file  Social History Narrative   Not on file   Social Determinants of Health   Financial Resource Strain: Low Risk  (05/21/2022)   Overall Financial Resource Strain (CARDIA)    Difficulty of Paying Living Expenses: Not very hard  Food Insecurity: Food Insecurity Present (05/21/2022)   Hunger Vital Sign    Worried  About Running Out of Food in the Last Year: Sometimes true    Ran Out of Food in the Last Year: Never true  Transportation Needs: No Transportation Needs (05/21/2022)   PRAPARE - Hydrologist (Medical): No    Lack of Transportation (Non-Medical): No  Physical Activity: Unknown (05/21/2022)   Exercise Vital Sign    Days of Exercise per Week: 7 days    Minutes of Exercise per Session: Patient refused  Stress: Stress Concern Present (05/21/2022)   [place]    Feeling of Stress : To some extent  Social Connections: Unknown (05/21/2022)   Social Connection and Isolation Panel [NHANES]    Frequency of Communication with Friends and Family: Three times a week    Frequency of Social Gatherings with Friends and Family: Once a week    Attends Religious Services: Patient refused  Active Member of Clubs or Organizations: No    Attends Archivist Meetings: Not on file    Marital Status: Married   Vitals:   05/21/22 0703  BP: 118/70  Pulse: 78  Resp: 16  Temp: 98.4 F (36.9 C)  SpO2: 97%  Body mass index is 52.34 kg/m.  Physical Exam Vitals and nursing note reviewed.  Constitutional:      General: She is not in acute distress.    Appearance: She is well-developed.  HENT:     Head: Normocephalic and atraumatic.     Mouth/Throat:     Mouth: Mucous membranes are moist.     Pharynx: Oropharynx is clear.  Eyes:     Conjunctiva/sclera: Conjunctivae normal.  Cardiovascular:     Rate and Rhythm: Normal rate and regular rhythm.  Pulmonary:     Effort: Pulmonary effort is normal. No respiratory distress.     Breath sounds: Normal breath sounds.  Abdominal:     Palpations: Abdomen is soft. There is no mass.     Tenderness: There is no abdominal tenderness. There is no right CVA tenderness or left CVA tenderness.  Skin:    General: Skin is warm.     Findings: No erythema.   Neurological:     Mental Status: She is alert and oriented to person, place, and time.  Psychiatric:        Mood and Affect: Mood is anxious.        Speech: Speech normal.   ASSESSMENT AND PLAN:  [name] was seen today for urinary tract infection and unable to focus.  Diagnoses and all orders for this visit: Orders Placed This Encounter  Procedures   DG Abd 1 View   POC HgB A1c   POCT urinalysis dipstick   Lab Results  Component Value Date   HGBA1C 5.4 05/21/2022   Prediabetes A1C improved. Consistency with a healthy live style for diabetes prevention encouraged.  Gross hematuria We discussed possible etiologies. Seems resolved. Urine dipstick today negative. ? Nephrolithiasis. Urine strainer given. KUB ordered.  Acute cystitis with hematuria Will treat empirically for cystitis. Increase fluid intake. Monitor for new symptoms. Urine sample was small,not enough for Ucx.  -     nitrofurantoin, macrocrystal-monohydrate, (MACROBID) 100 MG capsule; Take 1 capsule (100 mg total) by mouth 2 (two) times daily for 5 days.  Disturbed concentration New problem. We discussed possible problems that could be affecting concentration, including depression and anxiety. Adult ADD/ADHD is in the differential dx. Reports that it is not affecting her job. She is not interested in seeing psychiatrist for further screening.  Recurrent major depressive disorder, in partial remission (HCC) Continue sertraline 150 mg daily, she follows with gynecologist.  Return if symptoms worsen or fail to improve.  Bertis Hustead G. [place], MD  [facility] [place]. [place] office.

## 2022-05-21 ENCOUNTER — Ambulatory Visit (INDEPENDENT_AMBULATORY_CARE_PROVIDER_SITE_OTHER): Payer: BC Managed Care – PPO

## 2022-05-21 ENCOUNTER — Ambulatory Visit: Payer: BC Managed Care – PPO | Admitting: Family Medicine

## 2022-05-21 VITALS — BP 118/70 | HR 78 | Temp 98.4°F | Resp 16 | Ht 65.0 in | Wt 314.5 lb

## 2022-05-21 DIAGNOSIS — R7303 Prediabetes: Secondary | ICD-10-CM

## 2022-05-21 DIAGNOSIS — R310 Gross hematuria: Secondary | ICD-10-CM

## 2022-05-21 DIAGNOSIS — R41840 Attention and concentration deficit: Secondary | ICD-10-CM | POA: Diagnosis not present

## 2022-05-21 DIAGNOSIS — N3001 Acute cystitis with hematuria: Secondary | ICD-10-CM | POA: Diagnosis not present

## 2022-05-21 DIAGNOSIS — F3341 Major depressive disorder, recurrent, in partial remission: Secondary | ICD-10-CM | POA: Diagnosis not present

## 2022-05-21 DIAGNOSIS — Z87442 Personal history of urinary calculi: Secondary | ICD-10-CM | POA: Diagnosis not present

## 2022-05-21 DIAGNOSIS — M160 Bilateral primary osteoarthritis of hip: Secondary | ICD-10-CM | POA: Diagnosis not present

## 2022-05-21 LAB — POCT GLYCOSYLATED HEMOGLOBIN (HGB A1C): Hemoglobin A1C: 5.4 % (ref 4.0–5.6)

## 2022-05-21 LAB — POCT URINALYSIS DIPSTICK
Bilirubin, UA: NEGATIVE
Blood, UA: NEGATIVE
Glucose, UA: NEGATIVE
Ketones, UA: NEGATIVE
Leukocytes, UA: NEGATIVE
Nitrite, UA: NEGATIVE
Protein, UA: NEGATIVE
Spec Grav, UA: 1.02 (ref 1.010–1.025)
Urobilinogen, UA: 0.2 E.U./dL
pH, UA: 6 (ref 5.0–8.0)

## 2022-05-21 MED ORDER — NITROFURANTOIN MONOHYD MACRO 100 MG PO CAPS: 100.0000 mg | ORAL_CAPSULE | Freq: Two times a day (BID) | ORAL | 0 refills | Status: AC

## 2022-05-21 NOTE — Patient Instructions (Addendum)
A few things to remember from today's visit:  Prediabetes - Plan: POC HgB A1c  Gross hematuria - Plan: POCT urinalysis dipstick, DG Abd 1 View  Acute cystitis with hematuria - Plan: nitrofurantoin, macrocrystal-monohydrate, (MACROBID) 100 MG capsule We will treat empirically for urinary tract infection. Monitor for new symptoms. Increase water intake. If fever develops or severe abdominal pain with vomiting, you need to seek immediate medical attention.  If you need refills for medications you take chronically, please call your pharmacy. Do not use My Chart to request refills or for acute issues that need immediate attention. If you send a my chart message, it may take a few days to be addressed, specially if I am not in the office.  Please be sure medication list is accurate. If a new problem present, please set up appointment sooner than planned today.

## 2022-05-24 ENCOUNTER — Encounter (HOSPITAL_BASED_OUTPATIENT_CLINIC_OR_DEPARTMENT_OTHER): Payer: Self-pay | Admitting: Obstetrics & Gynecology

## 2022-05-24 DIAGNOSIS — F3341 Major depressive disorder, recurrent, in partial remission: Secondary | ICD-10-CM | POA: Insufficient documentation

## 2022-06-06 DIAGNOSIS — H5213 Myopia, bilateral: Secondary | ICD-10-CM | POA: Diagnosis not present

## 2022-06-10 NOTE — Progress Notes (Signed)
This encounter was created in error - please disregard.

## 2022-06-18 ENCOUNTER — Encounter: Payer: Self-pay | Admitting: Family Medicine

## 2022-06-18 ENCOUNTER — Other Ambulatory Visit: Payer: Self-pay | Admitting: Obstetrics & Gynecology

## 2022-07-05 DIAGNOSIS — K635 Polyp of colon: Secondary | ICD-10-CM | POA: Diagnosis not present

## 2022-07-05 DIAGNOSIS — D123 Benign neoplasm of transverse colon: Secondary | ICD-10-CM | POA: Diagnosis not present

## 2022-07-05 DIAGNOSIS — D124 Benign neoplasm of descending colon: Secondary | ICD-10-CM | POA: Diagnosis not present

## 2022-07-05 DIAGNOSIS — K625 Hemorrhage of anus and rectum: Secondary | ICD-10-CM | POA: Diagnosis not present

## 2022-07-05 DIAGNOSIS — F32A Depression, unspecified: Secondary | ICD-10-CM | POA: Diagnosis not present

## 2022-07-05 DIAGNOSIS — Z91011 Allergy to milk products: Secondary | ICD-10-CM | POA: Diagnosis not present

## 2022-07-05 DIAGNOSIS — Z79899 Other long term (current) drug therapy: Secondary | ICD-10-CM | POA: Diagnosis not present

## 2022-07-05 DIAGNOSIS — Z881 Allergy status to other antibiotic agents status: Secondary | ICD-10-CM | POA: Diagnosis not present

## 2022-07-05 DIAGNOSIS — K581 Irritable bowel syndrome with constipation: Secondary | ICD-10-CM | POA: Diagnosis not present

## 2022-07-05 DIAGNOSIS — Z86010 Personal history of colonic polyps: Secondary | ICD-10-CM | POA: Diagnosis not present

## 2022-07-05 DIAGNOSIS — K219 Gastro-esophageal reflux disease without esophagitis: Secondary | ICD-10-CM | POA: Diagnosis not present

## 2022-07-05 DIAGNOSIS — M1990 Unspecified osteoarthritis, unspecified site: Secondary | ICD-10-CM | POA: Diagnosis not present

## 2022-07-05 DIAGNOSIS — K648 Other hemorrhoids: Secondary | ICD-10-CM | POA: Diagnosis not present

## 2022-07-05 DIAGNOSIS — J45909 Unspecified asthma, uncomplicated: Secondary | ICD-10-CM | POA: Diagnosis not present

## 2022-07-05 DIAGNOSIS — E119 Type 2 diabetes mellitus without complications: Secondary | ICD-10-CM | POA: Diagnosis not present

## 2022-07-05 DIAGNOSIS — D122 Benign neoplasm of ascending colon: Secondary | ICD-10-CM | POA: Diagnosis not present

## 2022-07-17 ENCOUNTER — Encounter (HOSPITAL_BASED_OUTPATIENT_CLINIC_OR_DEPARTMENT_OTHER): Payer: Self-pay | Admitting: Obstetrics & Gynecology

## 2022-07-17 ENCOUNTER — Ambulatory Visit (INDEPENDENT_AMBULATORY_CARE_PROVIDER_SITE_OTHER): Payer: BC Managed Care – PPO | Admitting: Obstetrics & Gynecology

## 2022-07-17 VITALS — BP 132/88 | HR 69 | Ht 66.0 in | Wt 316.8 lb

## 2022-07-17 DIAGNOSIS — E282 Polycystic ovarian syndrome: Secondary | ICD-10-CM

## 2022-07-17 DIAGNOSIS — F3289 Other specified depressive episodes: Secondary | ICD-10-CM | POA: Diagnosis not present

## 2022-07-17 DIAGNOSIS — Z1231 Encounter for screening mammogram for malignant neoplasm of breast: Secondary | ICD-10-CM

## 2022-07-17 DIAGNOSIS — R110 Nausea: Secondary | ICD-10-CM

## 2022-07-17 DIAGNOSIS — Z01419 Encounter for gynecological examination (general) (routine) without abnormal findings: Secondary | ICD-10-CM

## 2022-07-17 DIAGNOSIS — K219 Gastro-esophageal reflux disease without esophagitis: Secondary | ICD-10-CM | POA: Diagnosis not present

## 2022-07-17 DIAGNOSIS — Z6841 Body Mass Index (BMI) 40.0 and over, adult: Secondary | ICD-10-CM | POA: Diagnosis not present

## 2022-07-17 DIAGNOSIS — Z23 Encounter for immunization: Secondary | ICD-10-CM | POA: Diagnosis not present

## 2022-07-17 MED ORDER — ONDANSETRON HCL 4 MG PO TABS: 4.0000 mg | ORAL_TABLET | Freq: Three times a day (TID) | ORAL | 1 refills | Status: DC | PRN

## 2022-07-17 MED ORDER — SPIRONOLACTONE 100 MG PO TABS: ORAL_TABLET | Freq: Two times a day (BID) | ORAL | 4 refills | Status: DC

## 2022-07-17 MED ORDER — PANTOPRAZOLE SODIUM 40 MG PO TBEC: 40.0000 mg | DELAYED_RELEASE_TABLET | Freq: Every day | ORAL | 3 refills | Status: DC

## 2022-07-17 MED ORDER — SERTRALINE HCL 100 MG PO TABS: 150.0000 mg | ORAL_TABLET | Freq: Every day | ORAL | 4 refills | Status: DC

## 2022-07-17 NOTE — Progress Notes (Signed)
51 y.o. G0P0000 Married White or Caucasian female here for annual exam.  Menstrual cycles are getting lighter.  Seems to have been changing this way for the last year.  Sometimes her cycles is just dark spotting.  She needs very little products for her bleeding.    Just had colonoscopy done.  Had some rectal bleeding in the summer.  Had 16 polyps and per her report, they were all adenomatous.    Patient's last menstrual period was 06/20/2022 (approximate).          Sexually active: No.  The current method of family planning is abstinence.    Smoker:  no  Health Maintenance: Pap:  06/25/2021 Negative History of abnormal Pap:  LEEP 1996 MMG:  06/28/2021 Negative Colonoscopy:  07/05/2022, 16 polyps were noted.  Follow up.   Screening Labs: pt requests today   reports that she has never smoked. She has never used smokeless tobacco. She reports that she does not drink alcohol and does not use drugs.  Past Medical History:  Diagnosis Date   Abnormal Pap smear of cervix 1996   HPV +   Anxiety    Asthma    Chicken pox    Depression    Genital warts    GERD (gastroesophageal reflux disease)    History of diverticulitis    History of genital warts    Hx: UTI (urinary tract infection)    Migraines    Urine incontinence     Past Surgical History:  Procedure Laterality Date   CERVICAL BIOPSY  W/ LOOP ELECTRODE EXCISION  [place]   TONSILLECTOMY AND ADENOIDECTOMY  1996   urethral stretching  1980    Current Outpatient Medications  Medication Sig Dispense Refill   acetaminophen (TYLENOL) 500 MG tablet Take 500 mg by mouth every 8 (eight) hours as needed.     albuterol (VENTOLIN HFA) 108 (90 Base) MCG/ACT inhaler Inhale 2 puffs into the lungs every 6 (six) hours as needed for wheezing or shortness of breath. 18 g 0   AUVI-Q 0.3 MG/0.3ML SOAJ injection      meloxicam (MOBIC) 15 MG tablet Take 1 tablet (15 mg total) by mouth daily. 30 tablet 0    ondansetron (ZOFRAN) 4 MG tablet Take 1 tablet (4 mg total) by mouth every 8 (eight) hours as needed for nausea or vomiting. 20 tablet 0   pantoprazole (PROTONIX) 40 MG tablet Take 1 tablet (40 mg total) by mouth daily. 30 tablet 0   rizatriptan (MAXALT-MLT) 10 MG disintegrating tablet DISSOLVE 1 TABLET ON THE TONGUE AS NEEDED FOR MIGRAINE. MAY REPEAT IN 2 HOURS AS NEEDED 9 tablet 2   Spacer/Aero-Holding Chambers (AEROCHAMBER PLUS) inhaler Use as instructed to use with inahaler. 1 each 1   budesonide-formoterol (SYMBICORT) 160-4.5 MCG/ACT inhaler Inhale 2 puffs into the lungs 2 (two) times daily. (Patient not taking: Reported on 07/17/2022) 1 each 6   levocetirizine (XYZAL) 5 MG tablet TAKE 1 TABLET BY MOUTH EVERY EVENING AS NEEDED, haven't started yet (Patient not taking: Reported on 07/17/2022)  5   sertraline (ZOLOFT) 100 MG tablet Take 1.5 tablets (150 mg total) by mouth daily. 135 tablet 4   spironolactone (ALDACTONE) 100 MG tablet TAKE 1 TABLET (100 MG TOTAL) BY MOUTH 2 (TWO) TIMES DAILY. 180 tablet 4   topiramate (TOPAMAX) 25 MG tablet TAKE 1 TABLET(25 MG) BY MOUTH AT BEDTIME (Patient not taking: Reported on 07/17/2022) 90 tablet 0  No current facility-administered medications for this visit.    Family History  Problem Relation Age of Onset   Arthritis Mother    Depression Mother    Hyperlipidemia Mother    Miscarriages / [place] Mother    Skin cancer Mother    Early death Father    Alcohol abuse Maternal Grandmother    Arthritis Maternal Grandmother    Depression Maternal Grandmother    Diabetes Maternal Grandmother    Hyperlipidemia Maternal Grandmother    Hypertension Maternal Grandmother    Kidney disease Maternal Grandmother    Arthritis Maternal Grandfather    Hypertension Maternal Grandfather    Kidney disease Maternal Grandfather    Heart attack Maternal Grandfather    Leukemia Maternal Grandfather    Bone cancer Maternal Grandfather    Alcohol abuse Paternal  Grandmother    Cancer Paternal Grandmother    Alcohol abuse Paternal Grandfather     ROS: Constitutional: negative Genitourinary:negative  Exam:   BP 132/88 (BP Location: Left Arm, Patient Position: Sitting, Cuff Size: Large)   Pulse 69   Ht '5\' 6"'$  (1.676 m) Comment: Reported  Wt (!) 316 lb 12.8 oz (143.7 kg)   LMP 06/20/2022 (Approximate)   BMI 51.13 kg/m   Height: '5\' 6"'$  (167.6 cm) (Reported)  General appearance: alert, cooperative and appears stated age Head: Normocephalic, without obvious abnormality, atraumatic Neck: no adenopathy, supple, symmetrical, trachea midline and thyroid normal to inspection and palpation Lungs: clear to auscultation bilaterally Breasts: normal appearance, no masses or tenderness Heart: regular rate and rhythm Abdomen: soft, non-tender; bowel sounds normal; no masses,  no organomegaly Extremities: extremities normal, atraumatic, no cyanosis or edema Skin: Skin color, texture, turgor normal. No rashes or lesions Lymph nodes: Cervical, supraclavicular, and axillary nodes normal. No abnormal inguinal nodes palpated Neurologic: Grossly normal   Pelvic: External genitalia:  no lesions              Urethra:  normal appearing urethra with no masses, tenderness or lesions              Bartholins and Skenes: normal                 Vagina: normal appearing vagina with normal color and no discharge, no lesions              Cervix: no lesions              Pap taken: No. Bimanual Exam:  Uterus:  normal size, contour, position, consistency, mobility, non-tender              Adnexa: normal adnexa and no mass, fullness, tenderness               Rectovaginal: Confirms               Anus:  normal sphincter tone, no lesions  Chaperone, Octaviano Batty, CMA, was present for exam.  Assessment/Plan: 1. Well woman exam with routine gynecological exam - Pap smear neg with neg HR HPV 07/25/2021. - Mammogram 06/28/2021.  Pt aware due. - Colonoscopy 07/05/2022.  Had  multiple polyps. - lab work ordered today - vaccines reviewed/updated  2. Class 3 severe obesity without serious comorbidity with body mass index (BMI) of 50.0 to 59.9 in adult, unspecified obesity type (HCC) - CBC - Comprehensive metabolic panel - Hemoglobin A1c - TSH - Lipid panel  3. PCOS (polycystic ovarian syndrome) - spironolactone (ALDACTONE) 100 MG tablet; TAKE 1 TABLET (100 MG TOTAL) BY MOUTH 2 (TWO)  TIMES DAILY.  Dispense: 180 tablet; Refill: 4  4. Other depression - sertraline (ZOLOFT) 100 MG tablet; Take 1.5 tablets (150 mg total) by mouth daily.  Dispense: 135 tablet; Refill: 4  5. Encounter for screening mammogram for malignant neoplasm of breast - MM 3D SCREEN BREAST BILATERAL; Future  6. Gastroesophageal reflux disease without esophagitis - pantoprazole (PROTONIX) 40 MG tablet; Take 1 tablet (40 mg total) by mouth daily.  Dispense: 90 tablet; Refill: 3  7. Nausea - ondansetron (ZOFRAN) 4 MG tablet; Take 1 tablet (4 mg total) by mouth every 8 (eight) hours as needed for nausea or vomiting.  Dispense: 30 tablet; Refill: 1

## 2022-07-18 LAB — COMPREHENSIVE METABOLIC PANEL
ALT: 37 IU/L — ABNORMAL HIGH (ref 0–32)
AST: 69 IU/L — ABNORMAL HIGH (ref 0–40)
Albumin/Globulin Ratio: 1.4 (ref 1.2–2.2)
Albumin: 4 g/dL (ref 3.9–4.9)
Alkaline Phosphatase: 104 IU/L (ref 44–121)
BUN/Creatinine Ratio: 10 (ref 9–23)
BUN: 7 mg/dL (ref 6–24)
Bilirubin Total: 0.4 mg/dL (ref 0.0–1.2)
CO2: 21 mmol/L (ref 20–29)
Calcium: 9.2 mg/dL (ref 8.7–10.2)
Chloride: 104 mmol/L (ref 96–106)
Creatinine, Ser: 0.69 mg/dL (ref 0.57–1.00)
Globulin, Total: 2.8 g/dL (ref 1.5–4.5)
Glucose: 112 mg/dL — ABNORMAL HIGH (ref 70–99)
Potassium: 3.9 mmol/L (ref 3.5–5.2)
Sodium: 140 mmol/L (ref 134–144)
Total Protein: 6.8 g/dL (ref 6.0–8.5)
eGFR: 106 mL/min/{1.73_m2} (ref >59)

## 2022-07-18 LAB — CBC
Hematocrit: 41.3 % (ref 34.0–46.6)
Hemoglobin: 13.8 g/dL (ref 11.1–15.9)
MCH: 28.4 pg (ref 26.6–33.0)
MCHC: 33.4 g/dL (ref 31.5–35.7)
MCV: 85 fL (ref 79–97)
Platelets: 269 10*3/uL (ref 150–450)
RBC: 4.86 x10E6/uL (ref 3.77–5.28)
RDW: 12.9 % (ref 11.7–15.4)
WBC: 6.7 10*3/uL (ref 3.4–10.8)

## 2022-07-18 LAB — LIPID PANEL
Chol/HDL Ratio: 5 ratio — ABNORMAL HIGH (ref 0.0–4.4)
Cholesterol, Total: 169 mg/dL (ref 100–199)
HDL: 34 mg/dL — ABNORMAL LOW (ref >39)
LDL Chol Calc (NIH): 101 mg/dL — ABNORMAL HIGH (ref 0–99)
Triglycerides: 198 mg/dL — ABNORMAL HIGH (ref 0–149)
VLDL Cholesterol Cal: 34 mg/dL (ref 5–40)

## 2022-07-18 LAB — HEMOGLOBIN A1C
Est. average glucose Bld gHb Est-mCnc: 126 mg/dL
Hgb A1c MFr Bld: 6 % — ABNORMAL HIGH (ref 4.8–5.6)

## 2022-07-18 LAB — TSH: TSH: 3.23 u[IU]/mL (ref 0.450–4.500)

## 2022-07-23 ENCOUNTER — Telehealth: Payer: BC Managed Care – PPO | Admitting: Family Medicine

## 2022-07-23 ENCOUNTER — Encounter (HOSPITAL_COMMUNITY): Payer: Self-pay | Admitting: *Deleted

## 2022-07-23 ENCOUNTER — Ambulatory Visit (HOSPITAL_COMMUNITY)
Admission: EM | Admit: 2022-07-23 | Discharge: 2022-07-23 | Disposition: A | Discharge status: Home / Self Care | Payer: BC Managed Care – PPO | Attending: Physician Assistant | Admitting: Physician Assistant

## 2022-07-23 DIAGNOSIS — R6889 Other general symptoms and signs: Secondary | ICD-10-CM

## 2022-07-23 DIAGNOSIS — J0140 Acute pansinusitis, unspecified: Secondary | ICD-10-CM | POA: Diagnosis not present

## 2022-07-23 MED ORDER — DOXYCYCLINE HYCLATE 100 MG PO CAPS: 100.0000 mg | ORAL_CAPSULE | Freq: Two times a day (BID) | ORAL | 0 refills | Status: DC

## 2022-07-23 NOTE — Discharge Instructions (Signed)
Use over-the-counter medications including Mucinex, Flonase, Tylenol.  Start doxycycline to cover for secondary infection.  Stay out of the sun while on this medication as it can make you have a bad sunburn.  Make sure that you rest and drink plenty of fluid.  Follow-up with your primary care if your symptoms are improving quickly with antibiotics.  If at any point anything worsens and you have shortness of breath, worsening cough, fever, nausea/vomiting interfere with oral intake, weakness you need to be seen immediately.

## 2022-07-23 NOTE — ED Triage Notes (Signed)
Pt states since Friday she has had burning in her nose and throat, headache, her neck has been swollen and she has had a productive cough. She is taking OTC cough meds without relief. She hasn't used her Albuterol MDI in more than a week.

## 2022-07-23 NOTE — ED Provider Notes (Signed)
[place]    CSN: [id number] Arrival date & time: 07/23/22  1559      History   Chief Complaint Chief Complaint  Patient presents with   Cough   Nasal Congestion   Sore Throat    HPI Alexa West is a 51 y.o. female.   Patient presents today with a month-long history of URI symptoms since she had COVID the week of Thanksgiving.  Over the past 5 days this is significantly worsened and she has significant congestion with thick purulent mucus and cough.  She has been using over-the-counter medications without improvement of symptoms.  She does have a history of asthma but denies any shortness of breath or chest tightness and has not required use of her albuterol since symptoms have worsened.  She has had COVID vaccines.  Denies any recent antibiotics or steroids.    Past Medical History:  Diagnosis Date   Abnormal Pap smear of cervix 1996   HPV +   Anxiety    Asthma    Chicken pox    Depression    Genital warts    GERD (gastroesophageal reflux disease)    History of diverticulitis    History of genital warts    Hx: UTI (urinary tract infection)    Migraines    Urine incontinence     Patient Active Problem List   Diagnosis Date Noted   Recurrent major depressive disorder, in partial remission ([place]) 05/24/2022   Hyperlipidemia 05/03/2021   Generalized osteoarthritis of multiple sites 05/17/2020   Fatigue 05/17/2020   Chronic pain disorder 05/17/2020   Primary osteoarthritis of left hip 03/03/2020   Right knee pain 10/27/2018   Neck pain 05/13/2018   Body mass index 50.0-59.9, adult ([place]) 05/13/2018   PCOS (polycystic ovarian syndrome) 12/05/2017   History of IBS 12/05/2017   S/P cholecystectomy 12/05/2017   Gastric ulcer with hemorrhage 27/78/2423   Eosinophilic esophagitis 53/61/4431   Morbid obesity ([place]) 10/13/2017   Migraine headache without aura 10/13/2017   GI bleed 08/20/2016   Transaminitis 08/20/2016   Prediabetes 08/20/2016    Lumbar pain 05/24/2014   Impaired fasting glucose 09/18/2010   Depression 03/04/2008   Esophageal reflux 03/04/2008    Past Surgical History:  Procedure Laterality Date   CERVICAL BIOPSY  W/ LOOP ELECTRODE [place]   TONSILLECTOMY AND ADENOIDECTOMY  1996   urethral stretching  1980    OB History     Gravida  0   Para  0   Term  0   Preterm  0   AB  0   Living  0      SAB  0   IAB  0   Ectopic  0   Multiple  0   Live Births  0            Home Medications    Prior to Admission medications   Medication Sig Start Date End Date Taking? Authorizing Provider  albuterol (VENTOLIN HFA) 108 (90 Base) MCG/ACT inhaler Inhale 2 puffs into the lungs every 6 (six) hours as needed for wheezing or shortness of breath. 02/25/22  Yes Laurey Morale, MD  AUVI-Q 0.3 MG/0.3ML SOAJ injection  01/27/18  Yes [provider]  doxycycline (VIBRAMYCIN) 100 MG capsule Take 1 capsule (100 mg total) by mouth 2 (two) times daily. 07/23/22  Yes Diavion Labrador, Derry Skill, PA-C  meloxicam (MOBIC) 15 MG tablet Take 1  tablet (15 mg total) by mouth daily. 04/23/22  Yes Glennon Mac, DO  ondansetron (ZOFRAN) 4 MG tablet Take 1 tablet (4 mg total) by mouth every 8 (eight) hours as needed for nausea or vomiting. 07/17/22  Yes Megan Salon, MD  pantoprazole (PROTONIX) 40 MG tablet Take 1 tablet (40 mg total) by mouth daily. 07/17/22  Yes Megan Salon, MD  rizatriptan (MAXALT-MLT) 10 MG disintegrating tablet DISSOLVE 1 TABLET ON THE TONGUE AS NEEDED FOR MIGRAINE. MAY REPEAT IN 2 HOURS AS NEEDED 10/10/21  Yes [place], Betty G, MD  sertraline (ZOLOFT) 100 MG tablet Take 1.5 tablets (150 mg total) by mouth daily. 07/17/22  Yes Megan Salon, MD  Spacer/Aero-Holding Chambers (AEROCHAMBER PLUS) inhaler Use as instructed to use with inahaler. 09/17/19  Yes [place], Betty G, MD  spironolactone (ALDACTONE) 100 MG tablet TAKE 1 TABLET (100 MG TOTAL) BY  MOUTH 2 (TWO) TIMES DAILY. 07/17/22 07/17/23 Yes Megan Salon, MD  budesonide-formoterol [facility]) 160-4.5 MCG/ACT inhaler Inhale 2 puffs into the lungs 2 (two) times daily. Patient not taking: Reported on 07/17/2022 01/22/21   Freddi Starr, MD  topiramate (TOPAMAX) 25 MG tablet TAKE 1 TABLET(25 MG) BY MOUTH AT BEDTIME Patient not taking: Reported on 07/17/2022 12/14/21   [place], Betty G, MD    Family History Family History  Problem Relation Age of Onset   Arthritis Mother    Depression Mother    Hyperlipidemia Mother    Miscarriages / [place] Mother    Skin cancer Mother    Early death Father    Alcohol abuse Maternal Grandmother    Arthritis Maternal Grandmother    Depression Maternal Grandmother    Diabetes Maternal Grandmother    Hyperlipidemia Maternal Grandmother    Hypertension Maternal Grandmother    Kidney disease Maternal Grandmother    Arthritis Maternal Grandfather    Hypertension Maternal Grandfather    Kidney disease Maternal Grandfather    Heart attack Maternal Grandfather    Leukemia Maternal Grandfather    Bone cancer Maternal Grandfather    Alcohol abuse Paternal Grandmother    Cancer Paternal Grandmother    Alcohol abuse Paternal Grandfather     Social History Social History   Tobacco Use   Smoking status: Never   Smokeless tobacco: Never  Vaping Use   Vaping Use: Never used  Substance Use Topics   Alcohol use: No   Drug use: No     Allergies   Cashew nut oil, Milk-related compounds, Wheat bran, Cephalosporins, and Penicillins   Review of Systems Review of Systems  Constitutional:  Positive for activity change. Negative for appetite change, fatigue and fever.  HENT:  Positive for congestion, postnasal drip, sinus pressure and sore throat. Negative for sneezing.   Respiratory:  Positive for cough. Negative for shortness of breath.   Cardiovascular:  Negative for chest pain.  Gastrointestinal:  Negative for abdominal pain,  diarrhea, nausea and vomiting.  Neurological:  Negative for dizziness, light-headedness and headaches.     Physical Exam Triage Vital Signs ED Triage Vitals  Enc Vitals Group     BP 07/23/22 1937 122/88     Pulse Rate 07/23/22 1937 73     Resp 07/23/22 1937 20     Temp 07/23/22 1937 99.2 F (37.3 C)     Temp Source 07/23/22 1937 Oral     SpO2 07/23/22 1937 98 %     Weight --      Height --      Head  Circumference --      Peak Flow --      Pain Score 07/23/22 1934 6     Pain Loc --      Pain Edu? --      Excl. in [place]? --    No data found.  Updated Vital Signs BP 122/88 (BP Location: Left Arm)   Pulse 73   Temp 99.2 F (37.3 C) (Oral)   Resp 20   LMP 06/20/2022 (Approximate)   SpO2 98%   Visual Acuity Right Eye Distance:   Left Eye Distance:   Bilateral Distance:    Right Eye Near:   Left Eye Near:    Bilateral Near:     Physical Exam Vitals reviewed.  Constitutional:      General: She is awake. She is not in acute distress.    Appearance: Normal appearance. She is well-developed. She is not ill-appearing.     Comments: Very pleasant female presented age no acute distress sitting comfortably in exam room  HENT:     Head: Normocephalic and atraumatic.     Right Ear: Tympanic membrane, ear canal and external ear normal. Tympanic membrane is not erythematous or bulging.     Left Ear: Tympanic membrane, ear canal and external ear normal. Tympanic membrane is not erythematous or bulging.     Nose:     Right Sinus: Maxillary sinus tenderness present. No frontal sinus tenderness.     Left Sinus: Maxillary sinus tenderness present. No frontal sinus tenderness.     Mouth/Throat:     Pharynx: Uvula midline. Posterior oropharyngeal erythema present. No oropharyngeal exudate.  Cardiovascular:     Rate and Rhythm: Normal rate and regular rhythm.     Heart sounds: Normal heart sounds, S1 normal and S2 normal. No murmur heard. Pulmonary:     Effort: Pulmonary effort is  normal.     Breath sounds: Normal breath sounds. No wheezing, rhonchi or rales.     Comments: Clear to auscultation bilaterally Psychiatric:        Behavior: Behavior is cooperative.      UC Treatments / Results  Labs (all labs ordered are listed, but only abnormal results are displayed) Labs Reviewed - No data to display  EKG   Radiology No results found.  Procedures Procedures (including critical care time)  Medications Ordered in UC Medications - No data to display  Initial Impression / Assessment and Plan / UC Course  I have reviewed the triage vital signs and the nursing notes.  Pertinent labs & imaging results that were available during my care of the patient were reviewed by me and considered in my medical decision making (see chart for details).     Patient is well-appearing, afebrile, nontoxic, nontachycardic.  No indication for viral testing as she is recently had COVID-19 and is outside the window effectiveness for Tamiflu.  I am concerned for secondary bacterial infection given she has had ongoing symptoms since COVID a month ago that have recently worsened.  Will cover for bacterial etiology with doxycycline 100 mg twice daily for 7 days.  Recommend she also use over-the-counter medication including Mucinex, Flonase, Tylenol.  She is to use nasal saline and sinus rinses for additional symptom relief.  Recommended that she rest and drink plenty of fluid.  If her symptoms are not improving within a week she is to return for reevaluation.  If anything worsens she needs to be seen immediately including fever, cough, shortness of breath, nausea/vomiting, chest pain, weakness.  Strict return precautions given.  Work excuse note provided.  Final Clinical Impressions(s) / UC Diagnoses   Final diagnoses:  Acute non-recurrent pansinusitis     Discharge Instructions      Use over-the-counter medications including Mucinex, Flonase, Tylenol.  Start doxycycline to cover  for secondary infection.  Stay out of the sun while on this medication as it can make you have a bad sunburn.  Make sure that you rest and drink plenty of fluid.  Follow-up with your primary care if your symptoms are improving quickly with antibiotics.  If at any point anything worsens and you have shortness of breath, worsening cough, fever, nausea/vomiting interfere with oral intake, weakness you need to be seen immediately.    ED Prescriptions     Medication Sig Dispense Auth. Provider   doxycycline (VIBRAMYCIN) 100 MG capsule Take 1 capsule (100 mg total) by mouth 2 (two) times daily. 14 capsule Journe Hallmark K, PA-C      PDMP not reviewed this encounter.   Terrilee Croak, PA-C 07/23/22 1953

## 2022-07-23 NOTE — Progress Notes (Signed)
Needs to convert to VV for more clarity of symptoms

## 2022-07-30 ENCOUNTER — Ambulatory Visit: Payer: BC Managed Care – PPO | Admitting: Family Medicine

## 2022-08-14 ENCOUNTER — Other Ambulatory Visit: Payer: Self-pay | Admitting: Pulmonary Disease

## 2022-08-14 DIAGNOSIS — J4530 Mild persistent asthma, uncomplicated: Secondary | ICD-10-CM

## 2022-09-27 ENCOUNTER — Other Ambulatory Visit: Payer: Self-pay | Admitting: Family Medicine

## 2022-12-18 DIAGNOSIS — R140 Abdominal distension (gaseous): Secondary | ICD-10-CM | POA: Diagnosis not present

## 2022-12-18 DIAGNOSIS — D126 Benign neoplasm of colon, unspecified: Secondary | ICD-10-CM | POA: Diagnosis not present

## 2022-12-18 DIAGNOSIS — M6208 Separation of muscle (nontraumatic), other site: Secondary | ICD-10-CM | POA: Diagnosis not present

## 2022-12-18 DIAGNOSIS — K5900 Constipation, unspecified: Secondary | ICD-10-CM | POA: Diagnosis not present

## 2022-12-18 DIAGNOSIS — K581 Irritable bowel syndrome with constipation: Secondary | ICD-10-CM | POA: Diagnosis not present

## 2022-12-18 DIAGNOSIS — R109 Unspecified abdominal pain: Secondary | ICD-10-CM | POA: Diagnosis not present

## 2022-12-20 ENCOUNTER — Encounter: Payer: Self-pay | Admitting: Family Medicine

## 2022-12-24 ENCOUNTER — Encounter (HOSPITAL_BASED_OUTPATIENT_CLINIC_OR_DEPARTMENT_OTHER): Payer: Self-pay | Admitting: Obstetrics & Gynecology

## 2022-12-30 ENCOUNTER — Encounter: Payer: Self-pay | Admitting: Family Medicine

## 2022-12-30 ENCOUNTER — Ambulatory Visit: Payer: BC Managed Care – PPO | Admitting: Family Medicine

## 2022-12-30 NOTE — Telephone Encounter (Signed)
Noted  

## 2023-01-13 ENCOUNTER — Other Ambulatory Visit: Payer: Self-pay

## 2023-01-13 MED ORDER — RIZATRIPTAN BENZOATE 10 MG PO TABS: 10.0000 mg | ORAL_TABLET | ORAL | 0 refills | Status: DC | PRN

## 2023-01-20 ENCOUNTER — Encounter (HOSPITAL_BASED_OUTPATIENT_CLINIC_OR_DEPARTMENT_OTHER): Payer: Self-pay | Admitting: Obstetrics & Gynecology

## 2023-03-21 ENCOUNTER — Telehealth: Payer: Self-pay | Admitting: Family Medicine

## 2023-03-21 NOTE — Telephone Encounter (Signed)
Patient has an appointment on Monday. 

## 2023-03-21 NOTE — Progress Notes (Unsigned)
ACUTE VISIT No chief complaint on file.  HPI: [name] is a 52 y.o. female, who is here today complaining of *** HPI Lab Results  Component Value Date   NA 140 07/17/2022   CL 104 07/17/2022   K 3.9 07/17/2022   CO2 21 07/17/2022   BUN 7 07/17/2022   CREATININE 0.69 07/17/2022   EGFR 106 07/17/2022   CALCIUM 9.2 07/17/2022   ALBUMIN 4.0 07/17/2022   GLUCOSE 112 (H) 07/17/2022   Lab Results  Component Value Date   TSH 3.230 07/17/2022   Lab Results  Component Value Date   WBC 6.7 07/17/2022   HGB 13.8 07/17/2022   HCT 41.3 07/17/2022   MCV 85 07/17/2022   PLT 269 07/17/2022  Borderline sleep study years ago, her husband has noted episodes of apnea. ***  Review of Systems See other pertinent positives and negatives in HPI.  Current Outpatient Medications on File Prior to Visit  Medication Sig Dispense Refill   albuterol (VENTOLIN HFA) 108 (90 Base) MCG/ACT inhaler Inhale 2 puffs into the lungs every 6 (six) hours as needed for wheezing or shortness of breath. 18 g 0   AUVI-Q 0.3 MG/0.3ML SOAJ injection      budesonide-formoterol (SYMBICORT) 160-4.5 MCG/ACT inhaler INHALE 2 PUFFS INTO THE LUNGS TWICE DAILY 10.2 g 0   doxycycline (VIBRAMYCIN) 100 MG capsule Take 1 capsule (100 mg total) by mouth 2 (two) times daily. 14 capsule 0   meloxicam (MOBIC) 15 MG tablet Take 1 tablet (15 mg total) by mouth daily. 30 tablet 0   ondansetron (ZOFRAN) 4 MG tablet Take 1 tablet (4 mg total) by mouth every 8 (eight) hours as needed for nausea or vomiting. 30 tablet 1   pantoprazole (PROTONIX) 40 MG tablet Take 1 tablet (40 mg total) by mouth daily. 90 tablet 3   rizatriptan (MAXALT) 10 MG tablet Take 1 tablet (10 mg total) by mouth as needed for migraine. May repeat in 2 hours if needed 10 tablet 0   sertraline (ZOLOFT) 100 MG tablet Take 1.5 tablets (150 mg total) by mouth daily. 135 tablet 4   Spacer/Aero-Holding Chambers (AEROCHAMBER PLUS) inhaler Use as instructed to  use with inahaler. 1 each 1   spironolactone (ALDACTONE) 100 MG tablet TAKE 1 TABLET (100 MG TOTAL) BY MOUTH 2 (TWO) TIMES DAILY. 180 tablet 4   topiramate (TOPAMAX) 25 MG tablet TAKE 1 TABLET(25 MG) BY MOUTH AT BEDTIME (Patient not taking: Reported on 07/17/2022) 90 tablet 0   No current facility-administered medications on file prior to visit.    Past Medical History:  Diagnosis Date   Abnormal Pap smear of cervix 1996   HPV +   Anxiety    Asthma    Chicken pox    Depression    Genital warts    GERD (gastroesophageal reflux disease)    History of diverticulitis    History of genital warts    Hx: UTI (urinary tract infection)    Migraines    Urine incontinence    Allergies  Allergen Reactions   Cashew Nut Oil    Milk-Related Compounds Diarrhea   Wheat    Cephalosporins Hives and Rash   Penicillins Hives and Rash    Social History   Socioeconomic History   Marital status: Married    Spouse name: Not on file   Number of children: 0   Years of education: Not on file   Highest education level: Associate degree: occupational, Scientist, product/process development, or vocational program  Occupational History   Not on file  Tobacco Use   Smoking status: Never   Smokeless tobacco: Never  Vaping Use   Vaping status: Never Used  Substance and Sexual Activity   Alcohol use: No   Drug use: No   Sexual activity: Yes    Partners: Male  Other Topics Concern   Not on file  Social History Narrative   Not on file   Social Determinants of Health   Financial Resource Strain: Patient Declined (12/17/2022)   Received from [facility], [facility]   Overall Financial Resource Strain (CARDIA)    Difficulty of Paying Living Expenses: Patient declined  Food Insecurity: No Food Insecurity (12/17/2022)   Received from [facility], [facility]   Hunger Vital Sign    Worried About Running Out of Food in the Last Year: Never true    Ran Out of Food in the Last Year: Never true  Transportation Needs:  No Transportation Needs (12/17/2022)   Received from [facility], [facility]   PRAPARE - Transportation    Lack of Transportation (Medical): No    Lack of Transportation (Non-Medical): No  Physical Activity: Unknown (12/17/2022)   Received from [facility], [facility]   Exercise Vital Sign    Days of Exercise per Week: 0 days    Minutes of Exercise per Session: Not on file  Stress: Stress Concern Present (12/17/2022)   Received from [facility] - Occupational Stress Questionnaire    Feeling of Stress : Very much  Social Connections: Socially Integrated (12/17/2022)   Received from [facility], [facility]   Social Network    How would you rate your social network (family, work, friends)?: Good participation with social networks    Vitals:   03/24/23 1408  Resp: 12   Wt Readings from Last 3 Encounters:  07/17/22 (!) 316 lb 12.8 oz (143.7 kg)  05/21/22 (!) 314 lb 8 oz (142.7 kg)  04/23/22 (!) 309 lb (140.2 kg)    There is no height or weight on file to calculate BMI.  Physical Exam Vitals and nursing note reviewed.  Constitutional:      General: She is not in acute distress.    Appearance: She is well-developed.  HENT:     Head: Normocephalic and atraumatic.     Mouth/Throat:     Mouth: Mucous membranes are moist.     Pharynx: Oropharynx is clear.  Eyes:     Conjunctiva/sclera: Conjunctivae normal.  Cardiovascular:     Rate and Rhythm: Normal rate and regular rhythm.     Pulses:          Dorsalis pedis pulses are 2+ on the right side and 2+ on the left side.     Heart sounds: No murmur heard.    Comments: Varicose veins LE, bilateral.  Right worse than left with torturous dilated veins. Pulmonary:     Effort: Pulmonary effort is normal. No respiratory distress.     Breath sounds: Normal breath sounds.  Chest:     Chest wall: No tenderness.  Abdominal:     Palpations: Abdomen is soft. There is  no hepatomegaly or mass.     Tenderness: There is no abdominal tenderness.  Musculoskeletal:     Thoracic back: No tenderness or bony tenderness.     Lumbar back: No tenderness or bony tenderness.     Right lower leg: No edema.     Left lower  leg: No edema.     Comments: Right interscapular pain with full shoulder abduction and deep breathing.  Lymphadenopathy:     Cervical: No cervical adenopathy.  Skin:    General: Skin is warm.     Findings: No erythema or rash.  Neurological:     General: No focal deficit present.     Mental Status: She is alert and oriented to person, place, and time.     Cranial Nerves: No cranial nerve deficit.     Gait: Gait normal.  Psychiatric:        Mood and Affect: Affect normal. Mood is anxious.    ASSESSMENT AND PLAN: Palpitations -     EKG 12-Lead -     Basic metabolic panel; Future -     CBC; Future -     TSH; Future -     Magnesium; Future  Right-sided thoracic back pain, unspecified chronicity  Asymptomatic varicose veins of both lower extremities  Chronic fatigue -     Vitamin B12; Future  Muscle cramp -     Basic metabolic panel; Future -     Magnesium; Future -     Vitamin B12; Future  Patient request for diagnostic testing -     Vitamin B12; Future  Impaired fasting glucose -     Hemoglobin A1c; Future  Sleep apnea, unspecified type -     Ambulatory referral to Pulmonology   Return if symptoms worsen or fail to improve, for keep next appointment.  Linlee Cromie G. [place], MD  [facility]. [place] office.

## 2023-03-21 NOTE — Telephone Encounter (Signed)
Alexa West with triage nurse called and stated pt is having pain in right breast radiating to her back, constant chest pain and neck pain. She recommended for pt to call 911, pt is refusing. I let her know I would send a high priority msg back to care team.

## 2023-03-21 NOTE — Telephone Encounter (Signed)
I spoke with patient. Symptoms have been going on for about 2 weeks, but have been more consistent the last week. Denies shortness of breath, but does feel like her heart is racing. Patient is aware to seek medical treatment immediately if anything changes or becomes worse prior to her appt on Monday.

## 2023-03-21 NOTE — Telephone Encounter (Signed)
I agree with initial triage recommendations, ED evaluation, which she declined. She was instructed about warning signs and to seek immediate medical attention if symptoms get worse. I will see her Monday. Charline Hoskinson [place], MD

## 2023-03-24 ENCOUNTER — Ambulatory Visit: Payer: BC Managed Care – PPO | Admitting: Family Medicine

## 2023-03-24 ENCOUNTER — Encounter: Payer: Self-pay | Admitting: Family Medicine

## 2023-03-24 VITALS — BP 110/72 | HR 70 | Temp 98.5°F | Resp 12 | Ht 66.0 in | Wt 310.1 lb

## 2023-03-24 DIAGNOSIS — I8393 Asymptomatic varicose veins of bilateral lower extremities: Secondary | ICD-10-CM

## 2023-03-24 DIAGNOSIS — R252 Cramp and spasm: Secondary | ICD-10-CM | POA: Diagnosis not present

## 2023-03-24 DIAGNOSIS — M546 Pain in thoracic spine: Secondary | ICD-10-CM

## 2023-03-24 DIAGNOSIS — R002 Palpitations: Secondary | ICD-10-CM

## 2023-03-24 DIAGNOSIS — Z0189 Encounter for other specified special examinations: Secondary | ICD-10-CM | POA: Diagnosis not present

## 2023-03-24 DIAGNOSIS — R7301 Impaired fasting glucose: Secondary | ICD-10-CM | POA: Diagnosis not present

## 2023-03-24 DIAGNOSIS — R5382 Chronic fatigue, unspecified: Secondary | ICD-10-CM

## 2023-03-24 DIAGNOSIS — G4730 Sleep apnea, unspecified: Secondary | ICD-10-CM

## 2023-03-24 NOTE — Patient Instructions (Addendum)
A few things to remember from today's visit:  Palpitations - Plan: EKG 12-Lead, Basic metabolic panel, CBC, TSH, Magnesium  Right-sided thoracic back pain, unspecified chronicity  Asymptomatic varicose veins of both lower extremities  Chronic fatigue - Plan: Vitamin B12  Muscle cramp - Plan: Basic metabolic panel, Magnesium, Vitamin B12  Patient request for diagnostic testing - Plan: Vitamin B12  Back pain seems to be muscle related. Today we do not have x-ray services.  You can go to another facility to have x-ray done or we can hold on this for now.  If you need refills for medications you take chronically, please call your pharmacy. Do not use My Chart to request refills or for acute issues that need immediate attention. If you send a my chart message, it may take a few days to be addressed, specially if I am not in the office.  Please be sure medication list is accurate. If a new problem present, please set up appointment sooner than planned today.

## 2023-03-25 LAB — TSH: TSH: 3.92 u[IU]/mL (ref 0.35–5.50)

## 2023-03-25 LAB — BASIC METABOLIC PANEL
BUN: 6 mg/dL (ref 6–23)
CO2: 23 meq/L (ref 19–32)
Calcium: 9.3 mg/dL (ref 8.4–10.5)
Chloride: 101 meq/L (ref 96–112)
Creatinine, Ser: 0.86 mg/dL (ref 0.40–1.20)
GFR: 78.4 mL/min (ref >60.00)
Glucose, Bld: 76 mg/dL (ref 70–99)
Potassium: 3.6 meq/L (ref 3.5–5.1)
Sodium: 137 meq/L (ref 135–145)

## 2023-03-25 LAB — CBC
HCT: 42 % (ref 36.0–46.0)
Hemoglobin: 13.7 g/dL (ref 12.0–15.0)
MCHC: 32.7 g/dL (ref 30.0–36.0)
MCV: 86.4 fl (ref 78.0–100.0)
Platelets: 291 10*3/uL (ref 150.0–400.0)
RBC: 4.87 Mil/uL (ref 3.87–5.11)
RDW: 14.1 % (ref 11.5–15.5)
WBC: 7.9 10*3/uL (ref 4.0–10.5)

## 2023-03-25 LAB — VITAMIN B12: Vitamin B-12: 311 pg/mL (ref 211–911)

## 2023-03-25 LAB — MAGNESIUM: Magnesium: 2 mg/dL (ref 1.5–2.5)

## 2023-03-25 LAB — HEMOGLOBIN A1C: Hgb A1c MFr Bld: 6 % (ref 4.6–6.5)

## 2023-03-27 ENCOUNTER — Ambulatory Visit (INDEPENDENT_AMBULATORY_CARE_PROVIDER_SITE_OTHER)
Admission: RE | Admit: 2023-03-27 | Discharge: 2023-03-27 | Disposition: A | Discharge status: Home / Self Care | Payer: BC Managed Care – PPO | Source: Ambulatory Visit | Attending: Family Medicine | Admitting: Family Medicine

## 2023-03-27 ENCOUNTER — Encounter: Payer: Self-pay | Admitting: Radiology

## 2023-03-27 DIAGNOSIS — J45909 Unspecified asthma, uncomplicated: Secondary | ICD-10-CM | POA: Diagnosis not present

## 2023-03-27 DIAGNOSIS — M546 Pain in thoracic spine: Secondary | ICD-10-CM

## 2023-03-27 DIAGNOSIS — R079 Chest pain, unspecified: Secondary | ICD-10-CM | POA: Diagnosis not present

## 2023-03-27 DIAGNOSIS — R918 Other nonspecific abnormal finding of lung field: Secondary | ICD-10-CM | POA: Diagnosis not present

## 2023-04-01 ENCOUNTER — Encounter: Payer: Self-pay | Admitting: Family Medicine

## 2023-04-06 DIAGNOSIS — R109 Unspecified abdominal pain: Secondary | ICD-10-CM | POA: Diagnosis not present

## 2023-04-06 DIAGNOSIS — Z20822 Contact with and (suspected) exposure to covid-19: Secondary | ICD-10-CM | POA: Diagnosis not present

## 2023-04-06 DIAGNOSIS — R110 Nausea: Secondary | ICD-10-CM | POA: Diagnosis not present

## 2023-04-06 DIAGNOSIS — N200 Calculus of kidney: Secondary | ICD-10-CM | POA: Diagnosis not present

## 2023-04-06 DIAGNOSIS — R509 Fever, unspecified: Secondary | ICD-10-CM | POA: Diagnosis not present

## 2023-04-08 ENCOUNTER — Other Ambulatory Visit: Payer: Self-pay

## 2023-04-08 ENCOUNTER — Emergency Department (HOSPITAL_COMMUNITY): Payer: BC Managed Care – PPO

## 2023-04-08 ENCOUNTER — Emergency Department (HOSPITAL_COMMUNITY)
Admission: EM | Admit: 2023-04-08 | Discharge: 2023-04-09 | Disposition: A | Discharge status: Home / Self Care | Payer: BC Managed Care – PPO | Attending: Emergency Medicine | Admitting: Emergency Medicine

## 2023-04-08 DIAGNOSIS — N838 Other noninflammatory disorders of ovary, fallopian tube and broad ligament: Secondary | ICD-10-CM | POA: Diagnosis not present

## 2023-04-08 DIAGNOSIS — K5732 Diverticulitis of large intestine without perforation or abscess without bleeding: Secondary | ICD-10-CM | POA: Insufficient documentation

## 2023-04-08 DIAGNOSIS — K5792 Diverticulitis of intestine, part unspecified, without perforation or abscess without bleeding: Secondary | ICD-10-CM

## 2023-04-08 DIAGNOSIS — R932 Abnormal findings on diagnostic imaging of liver and biliary tract: Secondary | ICD-10-CM | POA: Diagnosis not present

## 2023-04-08 DIAGNOSIS — R1032 Left lower quadrant pain: Secondary | ICD-10-CM | POA: Diagnosis not present

## 2023-04-08 DIAGNOSIS — N200 Calculus of kidney: Secondary | ICD-10-CM | POA: Diagnosis not present

## 2023-04-08 LAB — CBC WITH DIFFERENTIAL/PLATELET
Abs Immature Granulocytes: 0.04 10*3/uL (ref 0.00–0.07)
Basophils Absolute: 0.1 10*3/uL (ref 0.0–0.1)
Basophils Relative: 1 %
Eosinophils Absolute: 0.3 10*3/uL (ref 0.0–0.5)
Eosinophils Relative: 3 %
HCT: 44.9 % (ref 36.0–46.0)
Hemoglobin: 14.6 g/dL (ref 12.0–15.0)
Immature Granulocytes: 0 %
Lymphocytes Relative: 12 %
Lymphs Abs: 1.2 10*3/uL (ref 0.7–4.0)
MCH: 28.8 pg (ref 26.0–34.0)
MCHC: 32.5 g/dL (ref 30.0–36.0)
MCV: 88.6 fL (ref 80.0–100.0)
Monocytes Absolute: 0.9 10*3/uL (ref 0.1–1.0)
Monocytes Relative: 8 %
Neutro Abs: 8.1 10*3/uL — ABNORMAL HIGH (ref 1.7–7.7)
Neutrophils Relative %: 76 %
Platelets: 309 10*3/uL (ref 150–400)
RBC: 5.07 MIL/uL (ref 3.87–5.11)
RDW: 12.7 % (ref 11.5–15.5)
WBC: 10.6 10*3/uL — ABNORMAL HIGH (ref 4.0–10.5)
nRBC: 0 % (ref 0.0–0.2)

## 2023-04-08 LAB — URINALYSIS, ROUTINE W REFLEX MICROSCOPIC
Glucose, UA: NEGATIVE mg/dL
Hgb urine dipstick: NEGATIVE
Ketones, ur: NEGATIVE mg/dL
Nitrite: NEGATIVE
Protein, ur: 30 mg/dL — AB
Specific Gravity, Urine: >1.03 — ABNORMAL HIGH (ref 1.005–1.030)
pH: 6 (ref 5.0–8.0)

## 2023-04-08 LAB — COMPREHENSIVE METABOLIC PANEL
ALT: 31 U/L (ref 0–44)
AST: 48 U/L — ABNORMAL HIGH (ref 15–41)
Albumin: 3.3 g/dL — ABNORMAL LOW (ref 3.5–5.0)
Alkaline Phosphatase: 66 U/L (ref 38–126)
Anion gap: 12 (ref 5–15)
BUN: 7 mg/dL (ref 6–20)
CO2: 22 mmol/L (ref 22–32)
Calcium: 9 mg/dL (ref 8.9–10.3)
Chloride: 102 mmol/L (ref 98–111)
Creatinine, Ser: 1 mg/dL (ref 0.44–1.00)
GFR, Estimated: >60 mL/min (ref >60)
Glucose, Bld: 110 mg/dL — ABNORMAL HIGH (ref 70–99)
Potassium: 3.6 mmol/L (ref 3.5–5.1)
Sodium: 136 mmol/L (ref 135–145)
Total Bilirubin: 0.9 mg/dL (ref 0.3–1.2)
Total Protein: 7.5 g/dL (ref 6.5–8.1)

## 2023-04-08 LAB — URINALYSIS, MICROSCOPIC (REFLEX)

## 2023-04-08 LAB — LIPASE, BLOOD: Lipase: 31 U/L (ref 11–51)

## 2023-04-08 LAB — HCG, SERUM, QUALITATIVE: Preg, Serum: NEGATIVE

## 2023-04-08 MED ORDER — IOHEXOL 350 MG/ML SOLN
75.0000 mL | Freq: Once | INTRAVENOUS | Status: AC | PRN
  Administered 2023-04-08: 75 mL via INTRAVENOUS

## 2023-04-08 MED ORDER — MORPHINE SULFATE (PF) 2 MG/ML IV SOLN
4.0000 mg | Freq: Once | INTRAVENOUS | Status: AC
  Administered 2023-04-08: 4 mg via INTRAVENOUS
  Filled 2023-04-08: qty 2

## 2023-04-08 MED ORDER — ONDANSETRON HCL 4 MG/2ML IJ SOLN
4.0000 mg | Freq: Once | INTRAMUSCULAR | Status: AC
  Administered 2023-04-08: 4 mg via INTRAVENOUS
  Filled 2023-04-08: qty 2

## 2023-04-08 NOTE — ED Provider Triage Note (Cosign Needed Addendum)
Emergency Medicine Provider Triage Evaluation Note  Alexa West , a 52 y.o. female  was evaluated in triage.  Pt complains of left lower quadrant abdominal pain for the last week and a half.  Started having fevers last Friday.  Seen in urgent care, treated with Cipro and Flagyl which she has been taking.  Outpatient CT was ordered but unfortunately was not able to schedule this due to a 5-day wait.  Began to have nausea and vomiting today.  No diarrhea.  No urinary symptoms.  No pain radiating to the back.  Last bowel movement was this morning though was small.  Review of Systems  Positive: See HPI Negative: See HPI  Physical Exam  BP 128/79 (BP Location: Right Arm)   Pulse 85   Temp 100.1 F (37.8 C) (Oral)   Resp 16   LMP 02/25/2023   SpO2 96%  Gen:   Awake, appears very uncomfortable Resp:  Normal effort  MSK:   Moves extremities without difficulty  Other:  Moderate LLQ tenderness, abdomen soft, no rebound, guarding, no CVA tenderness  Medical Decision Making  Medically screening exam initiated at 4:55 PM.  Appropriate orders placed.  Davionne Swaim Binford was informed that the remainder of the evaluation will be completed by another provider, this initial triage assessment does not replace that evaluation, and the importance of remaining in the ED until their evaluation is complete.     Tonette Lederer, PA-C 04/08/23 1657    Tonette Lederer, PA-C 04/08/23 1705

## 2023-04-08 NOTE — ED Triage Notes (Signed)
Patient reports lower abdominal pain x 1 week, intermittent at first but constant since Friday. Went to [facility] Sunday and was given Flagyl and Cipro which she has taken without any improvement. Today began vomiting-emesis x 1, denies diarrhea.

## 2023-04-09 MED ORDER — PROMETHAZINE HCL 25 MG RE SUPP: 25.0000 mg | Freq: Four times a day (QID) | RECTAL | 0 refills | Status: DC | PRN

## 2023-04-09 MED ORDER — PROMETHAZINE HCL 25 MG PO TABS: 25.0000 mg | ORAL_TABLET | Freq: Four times a day (QID) | ORAL | 0 refills | Status: AC | PRN

## 2023-04-09 MED ORDER — METRONIDAZOLE 500 MG/100ML IV SOLN
500.0000 mg | Freq: Once | INTRAVENOUS | Status: AC
  Administered 2023-04-09: 500 mg via INTRAVENOUS
  Filled 2023-04-09: qty 100

## 2023-04-09 MED ORDER — OXYCODONE-ACETAMINOPHEN 5-325 MG PO TABS
1.0000 | ORAL_TABLET | Freq: Once | ORAL | Status: AC
  Administered 2023-04-09: 1 via ORAL
  Filled 2023-04-09: qty 1

## 2023-04-09 MED ORDER — LACTATED RINGERS IV BOLUS
1000.0000 mL | Freq: Once | INTRAVENOUS | Status: AC
  Administered 2023-04-09: 1000 mL via INTRAVENOUS

## 2023-04-09 MED ORDER — OXYCODONE-ACETAMINOPHEN 5-325 MG PO TABS
1.0000 | ORAL_TABLET | Freq: Three times a day (TID) | ORAL | 0 refills | Status: DC | PRN
Start: 2023-04-09 — End: 2023-11-12

## 2023-04-09 MED ORDER — HYDROMORPHONE HCL 1 MG/ML IJ SOLN
1.0000 mg | Freq: Once | INTRAMUSCULAR | Status: AC
  Administered 2023-04-09: 1 mg via INTRAVENOUS
  Filled 2023-04-09: qty 1

## 2023-04-09 MED ORDER — SODIUM CHLORIDE 0.9 % IV SOLN
12.5000 mg | Freq: Four times a day (QID) | INTRAVENOUS | Status: DC | PRN
  Administered 2023-04-09: 12.5 mg via INTRAVENOUS
  Filled 2023-04-09: qty 12.5

## 2023-04-09 MED ORDER — CIPROFLOXACIN IN D5W 400 MG/200ML IV SOLN
400.0000 mg | Freq: Once | INTRAVENOUS | Status: AC
  Administered 2023-04-09: 400 mg via INTRAVENOUS
  Filled 2023-04-09: qty 200

## 2023-04-09 NOTE — ED Provider Notes (Signed)
[place] EMERGENCY DEPARTMENT AT [facility] Provider Note   CSN: [id number] Arrival date & time: 04/08/23  1618     History  Chief Complaint  Patient presents with   Abdominal Pain    Alexa West is a 52 y.o. female.  52 year old female that presents ER today secondary to left lower quadrant pain.  She saw urgent care on Friday was diagnosed with diverticulitis and started Cipro Flagyl which she started on Sunday.  She was doing okay but then she started having nausea and vomiting that was not relieved by her home Zofran earlier today so she presents the ER for management of that.  The pain in her left lower quadrant seems to be a bit worse to.  She has some chills but no measured fevers.  No other associated symptoms.   Abdominal Pain      Home Medications Prior to Admission medications   Medication Sig Start Date End Date Taking? Authorizing Provider  oxyCODONE-acetaminophen (PERCOCET) 5-325 MG tablet Take 1-2 tablets by mouth every 8 (eight) hours as needed. 04/09/23  Yes Auburn Hester, Barbara Cower, MD  promethazine (PHENERGAN) 25 MG suppository Place 1 suppository (25 mg total) rectally every 6 (six) hours as needed for nausea or vomiting. 04/09/23  Yes Tevis Dunavan, Barbara Cower, MD  promethazine (PHENERGAN) 25 MG tablet Take 1 tablet (25 mg total) by mouth every 6 (six) hours as needed for nausea or vomiting. 04/09/23  Yes Malori Myers, Barbara Cower, MD  albuterol (VENTOLIN HFA) 108 (90 Base) MCG/ACT inhaler Inhale 2 puffs into the lungs every 6 (six) hours as needed for wheezing or shortness of breath. 02/25/22   Nelwyn Salisbury, MD  AUVI-Q 0.3 MG/0.3ML SOAJ injection  01/27/18   [provider]  budesonide-formoterol (SYMBICORT) 160-4.5 MCG/ACT inhaler INHALE 2 PUFFS INTO THE LUNGS TWICE DAILY 08/14/22   Martina Sinner, MD  doxycycline (VIBRAMYCIN) 100 MG capsule Take 1 capsule (100 mg total) by mouth 2 (two) times daily. 07/23/22   Raspet, Noberto Retort, PA-C  meloxicam (MOBIC) 15 MG tablet  Take 1 tablet (15 mg total) by mouth daily. 04/23/22   Richardean Sale, DO  ondansetron (ZOFRAN) 4 MG tablet Take 1 tablet (4 mg total) by mouth every 8 (eight) hours as needed for nausea or vomiting. 07/17/22   Jerene Bears, MD  pantoprazole (PROTONIX) 40 MG tablet Take 1 tablet (40 mg total) by mouth daily. 07/17/22   Jerene Bears, MD  rizatriptan (MAXALT) 10 MG tablet Take 1 tablet (10 mg total) by mouth as needed for migraine. May repeat in 2 hours if needed 01/13/23   [place], Betty G, MD  sertraline (ZOLOFT) 100 MG tablet Take 1.5 tablets (150 mg total) by mouth daily. 07/17/22   Jerene Bears, MD  Spacer/Aero-Holding Chambers (AEROCHAMBER PLUS) inhaler Use as instructed to use with inahaler. 09/17/19   [place], Betty G, MD  spironolactone (ALDACTONE) 100 MG tablet TAKE 1 TABLET (100 MG TOTAL) BY MOUTH 2 (TWO) TIMES DAILY. 07/17/22 07/17/23  Jerene Bears, MD  topiramate (TOPAMAX) 25 MG tablet TAKE 1 TABLET(25 MG) BY MOUTH AT BEDTIME Patient not taking: Reported on 07/17/2022 12/14/21   [place], Betty G, MD      Allergies    Cashew nut oil, Milk-related compounds, Wheat, Cephalosporins, and Penicillins    Review of Systems   Review of Systems  Gastrointestinal:  Positive for abdominal pain.    Physical Exam Updated Vital Signs BP 105/65   Pulse 70   Temp 98.2 F (36.8  C) (Oral)   Resp 18   LMP 02/25/2023   SpO2 97%  Physical Exam Vitals and nursing note reviewed.  Constitutional:      Appearance: She is well-developed.  HENT:     Head: Normocephalic and atraumatic.  Cardiovascular:     Rate and Rhythm: Normal rate and regular rhythm.  Pulmonary:     Effort: No respiratory distress.     Breath sounds: No stridor.  Abdominal:     General: There is no distension.     Tenderness: There is abdominal tenderness in the periumbilical area, suprapubic area, left upper quadrant and left lower quadrant. There is no rebound.  Musculoskeletal:     Cervical back: Normal range of  motion.  Neurological:     Mental Status: She is alert.     ED Results / Procedures / Treatments   Labs (all labs ordered are listed, but only abnormal results are displayed) Labs Reviewed  CBC WITH DIFFERENTIAL/PLATELET - Abnormal; Notable for the following components:      Result Value   WBC 10.6 (*)    Neutro Abs 8.1 (*)    All other components within normal limits  COMPREHENSIVE METABOLIC PANEL - Abnormal; Notable for the following components:   Glucose, Bld 110 (*)    Albumin 3.3 (*)    AST 48 (*)    All other components within normal limits  URINALYSIS, ROUTINE W REFLEX MICROSCOPIC - Abnormal; Notable for the following components:   Specific Gravity, Urine >1.030 (*)    Bilirubin Urine SMALL (*)    Protein, ur 30 (*)    Leukocytes,Ua TRACE (*)    All other components within normal limits  URINALYSIS, MICROSCOPIC (REFLEX) - Abnormal; Notable for the following components:   Bacteria, UA RARE (*)    All other components within normal limits  LIPASE, BLOOD  HCG, SERUM, QUALITATIVE    EKG None  Radiology CT ABDOMEN PELVIS W CONTRAST  Result Date: 04/08/2023 CLINICAL DATA:  Left lower quadrant abdominal pain. EXAM: CT ABDOMEN AND PELVIS WITH CONTRAST TECHNIQUE: Multidetector CT imaging of the abdomen and pelvis was performed using the standard protocol following bolus administration of intravenous contrast. RADIATION DOSE REDUCTION: This exam was performed according to the departmental dose-optimization program which includes automated exposure control, adjustment of the mA and/or kV according to patient size and/or use of iterative reconstruction technique. CONTRAST:  75mL OMNIPAQUE IOHEXOL 350 MG/ML SOLN COMPARISON:  X-ray 05/21/2022.  CT 05/09/2018 FINDINGS: Lower chest: Slight linear opacity lung bases likely scar or atelectasis. No pleural effusion. Hepatobiliary: Fatty liver infiltration with some nodular contours. Please correlate for chronic liver disease. Previous  cholecystectomy. There is tiny low-attenuation lesion in the right hepatic lobe on series 3, image 21 which is too small to completely characterize but likely a benign cystic lesion. No specific imaging follow up. Patent portal vein. Pancreas: Unremarkable. No pancreatic ductal dilatation or surrounding inflammatory changes. Spleen: Normal in size without focal abnormality. Adrenals/Urinary Tract: The adrenal glands are preserved. Nonobstructing 4 mm lower pole right-sided renal stone. Two small left-sided nonobstructing renal stones in the lower pole and 2 punctate foci in the upper pole. No enhancing renal mass or collecting system dilatation. The ureters have normal course and caliber extending down to the urinary bladder. Preserved contours of the urinary bladder which is underdistended. Stomach/Bowel: Scattered colonic stool. Normal appendix. Scattered left-sided colonic diverticula. There is wall thickening with moderate stranding in the area of the proximal sigmoid colon consistent with a area of  diverticulitis. Vascular engorgement. No complicating features at this time of extraluminal gas, rim enhancing fluid collection or signs of obstruction. Mild-to-moderate fluid and air in the stomach. Stomach is nondilated. The small bowel is nondilated. Several loops are fluid-filled. Vascular/Lymphatic: Normal caliber aorta and IVC. Retroaortic left renal vein. There are some small but mildly prominent retroperitoneal nodes, nonpathologic by size criteria. These could be reactive. Reproductive: Uterus is present. 2.3 cm left-sided ovarian cystic lesion. Based on size and appearance no specific imaging follow up. Other: Mesenteric stranding identified in the left lower quadrant. No widespread free air. No rim enhancing fluid collections. Small fat containing umbilical hernia. Musculoskeletal: Moderate degenerative changes of the spine with areas of stenosis diffusely along the lumbar spine. There are bridging  osteophytes and syndesmophytes along the thoracic spine. IMPRESSION: Proximal sigmoid colon diverticulitis with wall thickening, moderate inflammatory stranding. No complicating features at this time. Recommend follow up after treatment to confirm resolution and exclude secondary pathology. Nodular fatty liver.  Please correlate for chronic liver disease. Bilateral nonobstructing renal stones. Electronically Signed   By: Karen Kays M.D.   On: 04/08/2023 19:52    Procedures Procedures    Medications Ordered in ED Medications  promethazine (PHENERGAN) 12.5 mg in sodium chloride 0.9 % 50 mL IVPB (0 mg Intravenous Stopped 04/09/23 0355)  ondansetron (ZOFRAN) injection 4 mg (4 mg Intravenous Given 04/08/23 1719)  morphine (PF) 2 MG/ML injection 4 mg (4 mg Intravenous Given 04/08/23 1720)  iohexol (OMNIPAQUE) 350 MG/ML injection 75 mL (75 mLs Intravenous Contrast Given 04/08/23 1919)  lactated ringers bolus 1,000 mL (0 mLs Intravenous Stopped 04/09/23 0355)  ciprofloxacin (CIPRO) IVPB 400 mg (0 mg Intravenous Stopped 04/09/23 0304)  metroNIDAZOLE (FLAGYL) IVPB 500 mg (0 mg Intravenous Stopped 04/09/23 0355)  HYDROmorphone (DILAUDID) injection 1 mg (1 mg Intravenous Given 04/09/23 0146)  oxyCODONE-acetaminophen (PERCOCET/ROXICET) 5-325 MG per tablet 1 tablet (1 tablet Oral Given 04/09/23 0446)    ED Course/ Medical Decision Making/ A&P                                 Medical Decision Making Risk Prescription drug management.   CT consistent with uncomplicated diverticulitis.  Secondary to the nausea went ahead and put in IV antibiotics, pain meds and Phenergan.  Patient had persistent improvement in symptoms.  Was tolerating p.o.  Blood pressure was little bit soft in the chart when she sat up however improved  in a few seconds.  Patient feels comfortable with discharge with prescription for Phenergan and short course of pain medication.  I did advise to take stool softeners with the pain meds.  Will  return here for any new or worsening symptoms or failure to improve.   Final Clinical Impression(s) / ED Diagnoses Final diagnoses:  Diverticulitis    Rx / DC Orders ED Discharge Orders          Ordered    oxyCODONE-acetaminophen (PERCOCET) 5-325 MG tablet  Every 8 hours PRN        04/09/23 0534    promethazine (PHENERGAN) 25 MG tablet  Every 6 hours PRN        04/09/23 0534    promethazine (PHENERGAN) 25 MG suppository  Every 6 hours PRN        04/09/23 0534              Alexa West, Barbara Cower, MD 04/09/23 [phone]

## 2023-04-25 DIAGNOSIS — K581 Irritable bowel syndrome with constipation: Secondary | ICD-10-CM | POA: Diagnosis not present

## 2023-04-25 DIAGNOSIS — D126 Benign neoplasm of colon, unspecified: Secondary | ICD-10-CM | POA: Diagnosis not present

## 2023-04-25 DIAGNOSIS — M6208 Separation of muscle (nontraumatic), other site: Secondary | ICD-10-CM | POA: Diagnosis not present

## 2023-04-25 DIAGNOSIS — R140 Abdominal distension (gaseous): Secondary | ICD-10-CM | POA: Diagnosis not present

## 2023-05-05 DIAGNOSIS — R160 Hepatomegaly, not elsewhere classified: Secondary | ICD-10-CM | POA: Diagnosis not present

## 2023-05-05 DIAGNOSIS — K7689 Other specified diseases of liver: Secondary | ICD-10-CM | POA: Diagnosis not present

## 2023-05-05 DIAGNOSIS — K760 Fatty (change of) liver, not elsewhere classified: Secondary | ICD-10-CM | POA: Diagnosis not present

## 2023-05-05 DIAGNOSIS — N200 Calculus of kidney: Secondary | ICD-10-CM | POA: Diagnosis not present

## 2023-05-21 ENCOUNTER — Other Ambulatory Visit: Payer: Self-pay | Admitting: Family Medicine

## 2023-05-21 DIAGNOSIS — J4520 Mild intermittent asthma, uncomplicated: Secondary | ICD-10-CM

## 2023-05-24 ENCOUNTER — Encounter: Payer: Self-pay | Admitting: Family Medicine

## 2023-05-26 MED ORDER — ALBUTEROL SULFATE HFA 108 (90 BASE) MCG/ACT IN AERS: 2.0000 | INHALATION_SPRAY | Freq: Four times a day (QID) | RESPIRATORY_TRACT | 0 refills | Status: DC | PRN

## 2023-06-09 ENCOUNTER — Encounter: Payer: Self-pay | Admitting: Nurse Practitioner

## 2023-06-09 ENCOUNTER — Ambulatory Visit (INDEPENDENT_AMBULATORY_CARE_PROVIDER_SITE_OTHER): Payer: BC Managed Care – PPO | Admitting: Nurse Practitioner

## 2023-06-09 VITALS — BP 128/85 | HR 81 | Ht 66.5 in | Wt 307.4 lb

## 2023-06-09 DIAGNOSIS — R0789 Other chest pain: Secondary | ICD-10-CM | POA: Diagnosis not present

## 2023-06-09 DIAGNOSIS — R0683 Snoring: Secondary | ICD-10-CM | POA: Insufficient documentation

## 2023-06-09 DIAGNOSIS — G4719 Other hypersomnia: Secondary | ICD-10-CM | POA: Insufficient documentation

## 2023-06-09 NOTE — Patient Instructions (Signed)
Given your symptoms, I am concerned that you may have sleep disordered breathing with sleep apnea. You will need a sleep study for further evaluation. Someone will contact you to schedule this.   We discussed how untreated sleep apnea puts an individual at risk for cardiac arrhthymias, pulm HTN, DM, stroke and increases their risk for daytime accidents. We also briefly reviewed treatment options including weight loss, side sleeping position, oral appliance, CPAP therapy or referral to ENT for possible surgical options  Use caution when driving and pull over if you become sleepy.  Follow up in 6-8 weeks with Alexa Gerri Acre,NP to go over sleep study results, or sooner, if needed

## 2023-06-09 NOTE — Progress Notes (Signed)
@Patient  ID: Alexa West, female    DOB: Feb 23, 1971, 52 y.o.   MRN: [id number]  Chief Complaint  Patient presents with   Consult    Pt had a sleep study about 12 years ago, never diagnosed with sleep apnea. Pt states her husband says she snores and occ stops breathing     Referring provider: [place], Betty G, MD  HPI: 52 year old female, never smoker referred for sleep consult.  She has also been seen in our office in the past by Dr. Francine Graven for asthma; last visit 01/22/2021.  Past medical history significant for migraine headaches, GERD, history of GI bleed, PCOS, depression, chronic pain, HLD, prediabetes, obesity.  TEST/EVENTS:  03/27/2023 CXR: Peribronchial thickening, consistent with history of asthma.  No acute process. 04/08/2023 CT abdomen: Visualized lung fields with slight linear opacity in the lung bases, likely scarring.  Proximal sigmoid colon diverticulitis.  Nodular fatty liver disease.  Bilateral nonobstructing renal stones.  06/09/2023: Today - sleep consult Patient presents today for sleep consult.  She had a sleep study about 12 years ago which was negative for sleep apnea.  She does feel like her symptoms have worsened, especially over the last few years. Feeling fatigued during the day. If she's at work, she can stay busy but if she's home, she tends to take a nap  Her husband tells her she snores and occasionally stops breathing She does have a history of migraines and occasionally has a morning headaches She has fallen asleep driving before but this was over 15 years ago. No recent issues with drowsy driving. No sleep parasomnias/paralysis.  She doesn't have any issues falling asleep  She goes to bed between midnight and 2 AM.  Usually falls asleep quickly.  Wakes 1-2 times to use the restroom.  Usually gets up around 8 to 9 AM.  Does not operate any heavy machinery in her job Animal nutritionist.  Weight is fluctuated 10 pounds over the last 2 years.  Last sleep study was in  2012.  She does not have these records.  Has never been on CPAP.  Does not wear supplemental oxygen. No history of cardiac disease, stroke or diabetes.  She has been diagnosed with prediabetes but has been able to manage this with diet and exercise. She is a never smoker.  Does not drink alcohol.  No excessive caffeine intake.  No sleep aids.  Lives at home with her husband.  Works as a Associate Professor.  Has a family history of allergies, asthma, heart disease, and cancer. She has been working with her PCP on some atypical right sided chest pain that radiates to her upper back.  They believe it is either musculoskeletal or reflux related.  She has a EGD coming up.  Has been ongoing for about 3 months.  She had tried her albuterol for this but did not seem to make a difference.  She feels like her breathing overall has been doing well.  She has been off of her Symbicort.  Rarely uses her albuterol.  No significant cough.  Does have an occasional wheeze that usually resolves on its own or with albuterol.  Tends to occur with change in season.  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 18 g 0   AUVI-Q 0.3 MG/0.3ML SOAJ injection      budesonide-formoterol (SYMBICORT) 160-4.5 MCG/ACT inhaler INHALE 2 PUFFS INTO THE LUNGS TWICE DAILY 10.2 g 0   doxycycline (VIBRAMYCIN) 100 MG capsule Take 1 capsule (100 mg total) by mouth 2 (two) times daily. 14 capsule 0   meloxicam (MOBIC) 15 MG tablet Take 1 tablet (15 mg total) by mouth daily. 30 tablet 0   ondansetron (ZOFRAN) 4 MG tablet Take 1 tablet (4 mg total) by mouth every 8 (eight) hours as needed for nausea or vomiting. 30 tablet 1   oxyCODONE-acetaminophen (PERCOCET) 5-325 MG tablet Take 1-2 tablets by mouth every 8 (eight) hours as needed. 30 tablet 0   pantoprazole (PROTONIX) 40 MG tablet Take 1 tablet (40 mg total) by mouth daily. 90 tablet 3   promethazine (PHENERGAN) 25 MG suppository Place 1 suppository (25 mg total) rectally every 6 (six) hours as needed for nausea or vomiting. 12 each 0   promethazine (PHENERGAN) 25 MG tablet Take 1 tablet (25 mg total) by mouth every 6 (six) hours as needed for nausea or vomiting. 30 tablet 0   rizatriptan (MAXALT) 10 MG tablet Take 1 tablet (10 mg total) by mouth as needed for migraine. May repeat in 2 hours if needed 10 tablet 0   sertraline (ZOLOFT) 100 MG tablet Take 1.5 tablets (150 mg total) by mouth daily. 135 tablet 4   Spacer/Aero-Holding Chambers (AEROCHAMBER PLUS) inhaler Use as instructed to use with inahaler. 1 each 1   spironolactone (ALDACTONE) 100 MG tablet TAKE 1 TABLET (100 MG TOTAL) BY MOUTH 2 (TWO) TIMES DAILY. 180 tablet 4   topiramate (TOPAMAX) 25 MG tablet TAKE 1 TABLET(25 MG) BY MOUTH AT BEDTIME 90 tablet 0   No facility-administered medications prior to visit.     Review of Systems:   Constitutional: No weight loss or gain, night sweats, fevers, chills,  or lassitude. +fatigue  HEENT: No difficulty swallowing, tooth/dental problems,  or sore throat.  +chronic headaches, seasonal allergy symptoms  CV:  +intermittent right chest pain. No orthopnea, PND, swelling in lower extremities, anasarca, dizziness, palpitations, syncope Resp: +snoring, witnessed apneas. No shortness of breath with exertion or at rest. No excess mucus or change in color of mucus. No productive or non-productive. No hemoptysis. No wheezing.  No chest wall deformity GI:  +heartburn, indigestion. No abdominal pain, nausea, vomiting, diarrhea, change in bowel habits, loss of appetite, bloody stools.  GU: No dysuria, change in color of urine, urgency or frequency.   Skin: No rash, lesions, ulcerations MSK:  No joint pain or swelling.  + Back pain Neuro: No dizziness or lightheadedness.  Psych:  No depression or anxiety. Mood stable.     Physical Exam:  BP 128/85   Pulse 81   Ht 5' 6.5" (1.689 m)   Wt (!) 307 lb 6.4 oz (139.4 kg)   LMP 02/25/2023   SpO2 97%   BMI 48.87 kg/m   GEN: Pleasant, interactive, well-appearing; obese; in no acute distress HEENT:  Normocephalic and atraumatic. PERRLA. Sclera white. Nasal turbinates pink, moist and patent bilaterally. No rhinorrhea present. Oropharynx pink and moist, without exudate or edema. No lesions, ulcerations, or postnasal drip. Mallampati III NECK:  Supple w/ fair ROM. No JVD present. Normal carotid impulses w/o bruits. Thyroid symmetrical with no goiter or nodules palpated. No lymphadenopathy.   CV: RRR, no m/r/g, no peripheral edema. Pulses intact, +2 bilaterally. No cyanosis, pallor or clubbing. PULMONARY:  Unlabored, regular breathing. Clear bilaterally A&P w/o wheezes/rales/rhonchi. No accessory muscle use.  GI: BS present and normoactive. Soft, non-tender to palpation. No organomegaly or masses detected.  MSK: No erythema, warmth or tenderness. Cap refil <2 sec all extrem. No deformities or joint swelling noted.  Neuro: A/Ox3. No focal deficits noted.   Skin: Warm, no lesions or rashe Psych: Normal  affect and behavior. Judgement and thought content appropriate.     Lab Results:  CBC    Component Value Date/Time   WBC 10.6 (H) 04/08/2023 1712   RBC 5.07 04/08/2023 1712   HGB 14.6 04/08/2023 1712   HGB 13.8 07/17/2022 1122   HCT 44.9 04/08/2023 1712   HCT 41.3 07/17/2022 1122   PLT 309 04/08/2023 1712   PLT 269 07/17/2022 1122   MCV 88.6 04/08/2023 1712   MCV 85 07/17/2022 1122   MCH 28.8 04/08/2023 1712   MCHC 32.5 04/08/2023 1712   RDW 12.7 04/08/2023 1712   RDW 12.9 07/17/2022 1122   LYMPHSABS 1.2 04/08/2023 1712   MONOABS 0.9 04/08/2023 1712   EOSABS 0.3 04/08/2023 1712   BASOSABS 0.1 04/08/2023 1712    BMET    Component Value Date/Time   NA 136 04/08/2023 1712   NA 140 07/17/2022 1122   K 3.6 04/08/2023 1712   CL 102 04/08/2023 1712   CO2 22 04/08/2023 1712   GLUCOSE 110 (H) 04/08/2023 1712   BUN 7 04/08/2023 1712   BUN 7 07/17/2022 1122   CREATININE 1.00 04/08/2023 1712   CREATININE 0.90 05/04/2021 1541   CALCIUM 9.0 04/08/2023 1712   GFRNONAA >60 04/08/2023 1712   GFRNONAA 104 03/07/2020 0851   GFRAA 120 03/07/2020 0851    BNP No results found for: "BNP"   Imaging:  No results found.  Administration History     None           No data to display          No results found for: "NITRICOXIDE"      Assessment & Plan:   Excessive daytime sleepiness She has snoring, excessive daytime sleepiness, nocturnal apneic events, chronic headaches. BMI 48. Epworth 10. Given this,  I am concerned she could have sleep disordered breathing with obstructive sleep apnea. She will need sleep study for further evaluation.    - discussed how weight can impact sleep and risk for sleep disordered breathing - discussed options to assist with weight loss: combination of diet modification, cardiovascular and strength training exercises   - had an extensive discussion regarding the adverse health consequences related to untreated sleep disordered  breathing - specifically discussed the risks for hypertension, coronary artery disease, cardiac dysrhythmias, cerebrovascular disease, and diabetes -  lifestyle modification discussed   - discussed how sleep disruption can increase risk of accidents, particularly when driving - safe driving practices were discussed  Patient Instructions  Given your symptoms, I am concerned that you may have sleep disordered breathing with sleep apnea. You will need a sleep study for further evaluation. Someone will contact you to schedule this.   We discussed how untreated sleep apnea puts an individual at risk for cardiac arrhthymias, pulm HTN, DM, stroke and increases their risk for daytime accidents. We also briefly reviewed treatment options including weight loss, side sleeping position, oral appliance, CPAP therapy or referral to ENT for possible surgical options  Use caution when driving and pull over if you become sleepy.  Follow up in 6-8 weeks with Florentina Addison Callan Norden,NP to go over sleep study results, or sooner, if needed     Loud snoring See above  Obesity, Class III, BMI 40-49.9 (morbid obesity) (HCC) BMI 48.  Healthy weight loss encouraged.  Atypical chest pain Does not appear to be pulmonary in nature.  She had a chest x-ray that showed some peribronchial thickening but no significant cough or response to inhaler therapies.  CT imaging showed some mild scarring in the lung bases but otherwise no abnormalities.  EKG without any T wave abnormalities.  Possibly MSK given associated back symptoms.  She is having workup with EGD given chronic reflux symptoms as well.  May need cardiac evaluation as she is high risk given current BMI.  Advised to follow-up with PCP and continue ongoing evaluation.  ED precautions advised.  No active chest pain at visit today.   Advised if symptoms do not improve or worsen, to please contact office for sooner follow up or seek emergency care.   I spent 45 minutes of  dedicated to the care of this patient on the date of this encounter to include pre-visit review of records, face-to-face time with the patient discussing conditions above, post visit ordering of testing, clinical documentation with the electronic health record, making appropriate referrals as documented, and communicating necessary findings to members of the patients care team.  Noemi Chapel, NP 06/09/2023  Pt aware and understands NP's role.

## 2023-06-09 NOTE — Assessment & Plan Note (Signed)
She has snoring, excessive daytime sleepiness, nocturnal apneic events, chronic headaches. BMI 48. Epworth 10. Given this,  I am concerned she could have sleep disordered breathing with obstructive sleep apnea. She will need sleep study for further evaluation.    - discussed how weight can impact sleep and risk for sleep disordered breathing - discussed options to assist with weight loss: combination of diet modification, cardiovascular and strength training exercises   - had an extensive discussion regarding the adverse health consequences related to untreated sleep disordered breathing - specifically discussed the risks for hypertension, coronary artery disease, cardiac dysrhythmias, cerebrovascular disease, and diabetes - lifestyle modification discussed   - discussed how sleep disruption can increase risk of accidents, particularly when driving - safe driving practices were discussed  Patient Instructions  Given your symptoms, I am concerned that you may have sleep disordered breathing with sleep apnea. You will need a sleep study for further evaluation. Someone will contact you to schedule this.   We discussed how untreated sleep apnea puts an individual at risk for cardiac arrhthymias, pulm HTN, DM, stroke and increases their risk for daytime accidents. We also briefly reviewed treatment options including weight loss, side sleeping position, oral appliance, CPAP therapy or referral to ENT for possible surgical options  Use caution when driving and pull over if you become sleepy.  Follow up in 6-8 weeks with Alexa Nour Rodrigues,NP to go over sleep study results, or sooner, if needed

## 2023-06-09 NOTE — Assessment & Plan Note (Signed)
BMI 48. Healthy weight loss encouraged.

## 2023-06-09 NOTE — Assessment & Plan Note (Addendum)
Does not appear to be pulmonary in nature.  She had a chest x-ray that showed some peribronchial thickening but no significant cough or response to inhaler therapies.  CT imaging showed some mild scarring in the lung bases but otherwise no abnormalities.  EKG without any T wave abnormalities.  Possibly MSK given associated back symptoms.  She is having workup with EGD given chronic reflux symptoms as well.  May need cardiac evaluation as she is high risk given current BMI.  Advised to follow-up with PCP and continue ongoing evaluation.  ED precautions advised.  No active chest pain at visit today.

## 2023-06-09 NOTE — Assessment & Plan Note (Signed)
See above

## 2023-06-10 DIAGNOSIS — H5213 Myopia, bilateral: Secondary | ICD-10-CM | POA: Diagnosis not present

## 2023-06-11 ENCOUNTER — Encounter: Payer: Self-pay | Admitting: Family Medicine

## 2023-06-11 NOTE — Telephone Encounter (Signed)
Care team updated and letter sent for eye exam notes.

## 2023-07-14 DIAGNOSIS — K219 Gastro-esophageal reflux disease without esophagitis: Secondary | ICD-10-CM | POA: Diagnosis not present

## 2023-07-14 DIAGNOSIS — K5730 Diverticulosis of large intestine without perforation or abscess without bleeding: Secondary | ICD-10-CM | POA: Diagnosis not present

## 2023-07-14 DIAGNOSIS — K648 Other hemorrhoids: Secondary | ICD-10-CM | POA: Diagnosis not present

## 2023-07-14 DIAGNOSIS — E119 Type 2 diabetes mellitus without complications: Secondary | ICD-10-CM | POA: Diagnosis not present

## 2023-07-14 DIAGNOSIS — K5140 Inflammatory polyps of colon without complications: Secondary | ICD-10-CM | POA: Diagnosis not present

## 2023-07-14 DIAGNOSIS — J45909 Unspecified asthma, uncomplicated: Secondary | ICD-10-CM | POA: Diagnosis not present

## 2023-07-14 DIAGNOSIS — Z79899 Other long term (current) drug therapy: Secondary | ICD-10-CM | POA: Diagnosis not present

## 2023-07-14 DIAGNOSIS — F32A Depression, unspecified: Secondary | ICD-10-CM | POA: Diagnosis not present

## 2023-07-14 DIAGNOSIS — D126 Benign neoplasm of colon, unspecified: Secondary | ICD-10-CM | POA: Diagnosis not present

## 2023-07-14 DIAGNOSIS — Z6841 Body Mass Index (BMI) 40.0 and over, adult: Secondary | ICD-10-CM | POA: Diagnosis not present

## 2023-07-14 DIAGNOSIS — K635 Polyp of colon: Secondary | ICD-10-CM | POA: Diagnosis not present

## 2023-07-14 DIAGNOSIS — D123 Benign neoplasm of transverse colon: Secondary | ICD-10-CM | POA: Diagnosis not present

## 2023-07-14 DIAGNOSIS — R1011 Right upper quadrant pain: Secondary | ICD-10-CM | POA: Diagnosis not present

## 2023-07-14 DIAGNOSIS — D125 Benign neoplasm of sigmoid colon: Secondary | ICD-10-CM | POA: Diagnosis not present

## 2023-07-14 DIAGNOSIS — R140 Abdominal distension (gaseous): Secondary | ICD-10-CM | POA: Diagnosis not present

## 2023-07-14 DIAGNOSIS — E282 Polycystic ovarian syndrome: Secondary | ICD-10-CM | POA: Diagnosis not present

## 2023-07-16 ENCOUNTER — Encounter: Payer: Self-pay | Admitting: Family Medicine

## 2023-07-16 DIAGNOSIS — D229 Melanocytic nevi, unspecified: Secondary | ICD-10-CM

## 2023-07-17 ENCOUNTER — Ambulatory Visit: Payer: BC Managed Care – PPO

## 2023-07-17 DIAGNOSIS — G4730 Sleep apnea, unspecified: Secondary | ICD-10-CM | POA: Diagnosis not present

## 2023-07-17 DIAGNOSIS — G4719 Other hypersomnia: Secondary | ICD-10-CM

## 2023-07-17 DIAGNOSIS — R0683 Snoring: Secondary | ICD-10-CM

## 2023-07-28 ENCOUNTER — Ambulatory Visit (HOSPITAL_BASED_OUTPATIENT_CLINIC_OR_DEPARTMENT_OTHER)
Admission: RE | Admit: 2023-07-28 | Discharge: 2023-07-28 | Disposition: A | Discharge status: Home / Self Care | Payer: BC Managed Care – PPO | Source: Ambulatory Visit | Attending: Obstetrics & Gynecology | Admitting: Obstetrics & Gynecology

## 2023-07-28 ENCOUNTER — Ambulatory Visit (INDEPENDENT_AMBULATORY_CARE_PROVIDER_SITE_OTHER): Payer: BC Managed Care – PPO | Admitting: Obstetrics & Gynecology

## 2023-07-28 ENCOUNTER — Encounter (HOSPITAL_BASED_OUTPATIENT_CLINIC_OR_DEPARTMENT_OTHER): Payer: Self-pay | Admitting: Radiology

## 2023-07-28 ENCOUNTER — Encounter (HOSPITAL_BASED_OUTPATIENT_CLINIC_OR_DEPARTMENT_OTHER): Payer: Self-pay | Admitting: Obstetrics & Gynecology

## 2023-07-28 VITALS — BP 112/65 | HR 70 | Ht 65.5 in | Wt 314.4 lb

## 2023-07-28 DIAGNOSIS — Z6841 Body Mass Index (BMI) 40.0 and over, adult: Secondary | ICD-10-CM | POA: Diagnosis not present

## 2023-07-28 DIAGNOSIS — E66813 Obesity, class 3: Secondary | ICD-10-CM

## 2023-07-28 DIAGNOSIS — N912 Amenorrhea, unspecified: Secondary | ICD-10-CM

## 2023-07-28 DIAGNOSIS — F3289 Other specified depressive episodes: Secondary | ICD-10-CM

## 2023-07-28 DIAGNOSIS — E282 Polycystic ovarian syndrome: Secondary | ICD-10-CM

## 2023-07-28 DIAGNOSIS — Z01419 Encounter for gynecological examination (general) (routine) without abnormal findings: Secondary | ICD-10-CM | POA: Diagnosis not present

## 2023-07-28 DIAGNOSIS — M79604 Pain in right leg: Secondary | ICD-10-CM

## 2023-07-28 DIAGNOSIS — Z1231 Encounter for screening mammogram for malignant neoplasm of breast: Secondary | ICD-10-CM

## 2023-07-28 DIAGNOSIS — M79605 Pain in left leg: Secondary | ICD-10-CM

## 2023-07-28 DIAGNOSIS — E782 Mixed hyperlipidemia: Secondary | ICD-10-CM

## 2023-07-28 DIAGNOSIS — I951 Orthostatic hypotension: Secondary | ICD-10-CM

## 2023-07-28 DIAGNOSIS — K219 Gastro-esophageal reflux disease without esophagitis: Secondary | ICD-10-CM

## 2023-07-28 MED ORDER — SERTRALINE HCL 100 MG PO TABS
150.0000 mg | ORAL_TABLET | Freq: Every day | ORAL | 4 refills | Status: DC
Start: 2023-07-28 — End: 2024-01-22

## 2023-07-28 MED ORDER — PANTOPRAZOLE SODIUM 40 MG PO TBEC
40.0000 mg | DELAYED_RELEASE_TABLET | Freq: Every day | ORAL | 3 refills | Status: AC
Start: 2023-07-28 — End: ?

## 2023-07-28 MED ORDER — SPIRONOLACTONE 100 MG PO TABS
ORAL_TABLET | Freq: Two times a day (BID) | ORAL | 4 refills | Status: DC
Start: 2023-07-28 — End: 2023-12-09

## 2023-07-28 NOTE — Progress Notes (Signed)
52 y.o. G0P0000 Married White or Caucasian female here for annual exam.  Hasn't really had a cycle since July.    Seeing GI, Dr. Yevonne Pax.  Has IBS and some intermittent issues with rectal bleeding.  Had six polyps this time and was advised could follow up again in 3 years.  Stool has changed some since having the polyps removed.  The mucous she was having has also improved.  Since the prep, she's had feet and left leg pain.  She's felt some muscle cramping since then as well.    She is noticing light headed sensation when she sits up or stands up quickly.  Has experienced some chest pain earlier this year that was under her right breast and through to her back.  This seems to have resolved.  She did see Dr. [place] about this.  She does have a blood pressure cuff and I recommended she start taking her blood pressure when she sits up and has the light headed sensation.    Patient's last menstrual period was 02/25/2023.          Sexually active: No.  The current method of family planning is abstinence.    Smoker:  no  Health Maintenance: Pap:  06/25/2021 Negative History of abnormal Pap:  LEEP 1996 MMG:  06/28/2021 Negative Colonoscopy:  07/14/2023 Screening Labs: Dr. [place]   reports that she has never smoked. She has never used smokeless tobacco. She reports that she does not drink alcohol and does not use drugs.  Past Medical History:  Diagnosis Date   Abnormal Pap smear of cervix 1996   HPV +   Anxiety    Asthma    Chicken pox    Depression    Genital warts    GERD (gastroesophageal reflux disease)    History of diverticulitis    History of genital warts    Hx: UTI (urinary tract infection)    Migraines    Urine incontinence     Past Surgical History:  Procedure Laterality Date   CERVICAL BIOPSY  W/ LOOP ELECTRODE EXCISION  1996   CHOLECYSTECTOMY  1997   HERNIA REPAIR  1976   TONSILLECTOMY AND ADENOIDECTOMY  1996   urethral stretching  1980    Current Outpatient  Medications  Medication Sig Dispense Refill   albuterol (VENTOLIN HFA) 108 (90 Base) MCG/ACT inhaler Inhale 2 puffs into the lungs every 6 (six) hours as needed for wheezing or shortness of breath. 18 g 0   AUVI-Q 0.3 MG/0.3ML SOAJ injection      budesonide-formoterol (SYMBICORT) 160-4.5 MCG/ACT inhaler INHALE 2 PUFFS INTO THE LUNGS TWICE DAILY 10.2 g 0   doxycycline (VIBRAMYCIN) 100 MG capsule Take 1 capsule (100 mg total) by mouth 2 (two) times daily. 14 capsule 0   meloxicam (MOBIC) 15 MG tablet Take 1 tablet (15 mg total) by mouth daily. 30 tablet 0   ondansetron (ZOFRAN) 4 MG tablet Take 1 tablet (4 mg total) by mouth every 8 (eight) hours as needed for nausea or vomiting. 30 tablet 1   oxyCODONE-acetaminophen (PERCOCET) 5-325 MG tablet Take 1-2 tablets by mouth every 8 (eight) hours as needed. 30 tablet 0   pantoprazole (PROTONIX) 40 MG tablet Take 1 tablet (40 mg total) by mouth daily. 90 tablet 3   promethazine (PHENERGAN) 25 MG suppository Place 1 suppository (25 mg total) rectally every 6 (six) hours as needed for nausea or vomiting. 12 each 0   promethazine (PHENERGAN) 25 MG tablet Take 1 tablet (25  mg total) by mouth every 6 (six) hours as needed for nausea or vomiting. 30 tablet 0   rizatriptan (MAXALT) 10 MG tablet Take 1 tablet (10 mg total) by mouth as needed for migraine. May repeat in 2 hours if needed 10 tablet 0   sertraline (ZOLOFT) 100 MG tablet Take 1.5 tablets (150 mg total) by mouth daily. 135 tablet 4   Spacer/Aero-Holding Chambers (AEROCHAMBER PLUS) inhaler Use as instructed to use with inahaler. 1 each 1   topiramate (TOPAMAX) 25 MG tablet TAKE 1 TABLET(25 MG) BY MOUTH AT BEDTIME 90 tablet 0   spironolactone (ALDACTONE) 100 MG tablet TAKE 1 TABLET (100 MG TOTAL) BY MOUTH 2 (TWO) TIMES DAILY. 180 tablet 4   No current facility-administered medications for this visit.    Family History  Problem Relation Age of Onset   Arthritis Mother    Depression Mother     Hyperlipidemia Mother    Miscarriages / [place] Mother    Skin cancer Mother    Early death Father    Alcohol abuse Maternal Grandmother    Arthritis Maternal Grandmother    Depression Maternal Grandmother    Diabetes Maternal Grandmother    Hyperlipidemia Maternal Grandmother    Hypertension Maternal Grandmother    Kidney disease Maternal Grandmother    Arthritis Maternal Grandfather    Hypertension Maternal Grandfather    Kidney disease Maternal Grandfather    Heart attack Maternal Grandfather    Leukemia Maternal Grandfather    Bone cancer Maternal Grandfather    Alcohol abuse Paternal Grandmother    Cancer Paternal Grandmother    Alcohol abuse Paternal Grandfather     ROS: Constitutional: negative Genitourinary:negative  Exam:   BP 112/65 (BP Location: Right Arm, Patient Position: Sitting, Cuff Size: Large)   Pulse 70   Ht 5' 5.5" (1.664 m)   Wt (!) 314 lb 6.4 oz (142.6 kg)   LMP 02/25/2023   BMI 51.52 kg/m   Height: 5' 5.5" (166.4 cm)  General appearance: alert, cooperative and appears stated age Head: Normocephalic, without obvious abnormality, atraumatic Neck: no adenopathy, supple, symmetrical, trachea midline and thyroid normal to inspection and palpation Lungs: clear to auscultation bilaterally Breasts: normal appearance, no masses or tenderness Heart: regular rate and rhythm Abdomen: soft, non-tender; bowel sounds normal; no masses,  no organomegaly Extremities: extremities normal, atraumatic, no cyanosis or edema Skin: Skin color, texture, turgor normal. No rashes or lesions Lymph nodes: Cervical, supraclavicular, and axillary nodes normal. No abnormal inguinal nodes palpated Neurologic: Grossly normal   Pelvic: External genitalia:  right labia minora sebaceous cyst              Urethra:  normal appearing urethra with no masses, tenderness or lesions              Bartholins and Skenes: normal                 Vagina: normal appearing vagina with  normal color and no discharge, no lesions              Cervix: no lesions              Pap taken: No. Bimanual Exam:  Uterus:  normal size, contour, position, consistency, mobility, non-tender              Adnexa: normal adnexa and no mass, fullness, tenderness               Rectovaginal: Confirms  Anus:  normal sphincter tone, no lesions  Chaperone, Ina Homes, CMA, was present for exam.  Assessment/Plan: 1. Well woman exam with routine gynecological exam (Primary) - Pap smear 05/2021, neg with neg HR HPV - Mammogram can be done today.  Called radiology.  She will go there after this. - Colonoscopy 06/2022 - lab work done ordered today per below - vaccines reviewed/updated  2. Encounter for screening mammogram for malignant neoplasm of breast - MM 3D SCREENING MAMMOGRAM BILATERAL BREAST; Future  3. PCOS (polycystic ovarian syndrome) - spironolactone (ALDACTONE) 100 MG tablet; TAKE 1 TABLET (100 MG TOTAL) BY MOUTH 2 (TWO) TIMES DAILY.  Dispense: 180 tablet; Refill: 4  4. Amenorrhea - Follicle stimulating hormone  5. BMI 50.0-59.9, adult (HCC)  6. Class 3 severe obesity without serious comorbidity with body mass index (BMI) of 50.0 to 59.9 in adult, unspecified obesity type (HCC)  7. Postural hypotension - Ambulatory referral to Cardiology  8. Elevated cholesterol with high triglycerides  9. Gastroesophageal reflux disease without esophagitis - pantoprazole (PROTONIX) 40 MG tablet; Take 1 tablet (40 mg total) by mouth daily.  Dispense: 90 tablet; Refill: 3  10. Other depression - sertraline (ZOLOFT) 100 MG tablet; Take 1.5 tablets (150 mg total) by mouth daily.  Dispense: 135 tablet; Refill: 4  11. Pain in both lower extremities - Comprehensive metabolic panel

## 2023-07-29 ENCOUNTER — Ambulatory Visit: Payer: BC Managed Care – PPO | Admitting: Podiatry

## 2023-07-29 ENCOUNTER — Encounter: Payer: Self-pay | Admitting: Podiatry

## 2023-07-29 ENCOUNTER — Ambulatory Visit (INDEPENDENT_AMBULATORY_CARE_PROVIDER_SITE_OTHER): Payer: BC Managed Care – PPO

## 2023-07-29 DIAGNOSIS — M19072 Primary osteoarthritis, left ankle and foot: Secondary | ICD-10-CM

## 2023-07-29 DIAGNOSIS — M778 Other enthesopathies, not elsewhere classified: Secondary | ICD-10-CM

## 2023-07-29 DIAGNOSIS — M25571 Pain in right ankle and joints of right foot: Secondary | ICD-10-CM

## 2023-07-29 DIAGNOSIS — M19071 Primary osteoarthritis, right ankle and foot: Secondary | ICD-10-CM | POA: Diagnosis not present

## 2023-07-29 DIAGNOSIS — M7662 Achilles tendinitis, left leg: Secondary | ICD-10-CM | POA: Diagnosis not present

## 2023-07-29 DIAGNOSIS — R601 Generalized edema: Secondary | ICD-10-CM

## 2023-07-29 DIAGNOSIS — M25572 Pain in left ankle and joints of left foot: Secondary | ICD-10-CM

## 2023-07-29 LAB — COMPREHENSIVE METABOLIC PANEL
ALT: 38 [IU]/L — ABNORMAL HIGH (ref 0–32)
AST: 51 [IU]/L — ABNORMAL HIGH (ref 0–40)
Albumin: 4.1 g/dL (ref 3.8–4.9)
Alkaline Phosphatase: 117 [IU]/L (ref 44–121)
BUN/Creatinine Ratio: 8 — ABNORMAL LOW (ref 9–23)
BUN: 6 mg/dL (ref 6–24)
Bilirubin Total: 0.4 mg/dL (ref 0.0–1.2)
CO2: 22 mmol/L (ref 20–29)
Calcium: 9.3 mg/dL (ref 8.7–10.2)
Chloride: 103 mmol/L (ref 96–106)
Creatinine, Ser: 0.72 mg/dL (ref 0.57–1.00)
Globulin, Total: 3.2 g/dL (ref 1.5–4.5)
Glucose: 100 mg/dL — ABNORMAL HIGH (ref 70–99)
Potassium: 3.7 mmol/L (ref 3.5–5.2)
Sodium: 142 mmol/L (ref 134–144)
Total Protein: 7.3 g/dL (ref 6.0–8.5)
eGFR: 101 mL/min/{1.73_m2} (ref >59)

## 2023-07-29 LAB — FOLLICLE STIMULATING HORMONE: FSH: 30.4 m[IU]/mL

## 2023-07-29 MED ORDER — MELOXICAM 7.5 MG PO TABS: 7.5000 mg | ORAL_TABLET | Freq: Every day | ORAL | 0 refills | Status: DC

## 2023-07-29 NOTE — Progress Notes (Signed)
 Chief Complaint  Patient presents with   Foot Pain    RM15: NP, Both feet are swelling   HPI: 52 y.o. female presents today with multiple concerns:  First, she has concern of swelling in the legs and ankles recently.  She states that she recently had a colonoscopy performed and has had some swelling since then.  She is not currently wearing any compression stockings.    Second, she is having pain across the dorsal midfoot bilateral.  Denies injury/trauma.  It is not constant.  It does typically occur daily.  Denies bruising.  Notes that this is aggravated with prolonged weightbearing.    Third, she is having pain along the back of the left heel.  Denies feeling a pop or snapping sensation.    Fourth, she presented with toenail polish on her toes, but wanted the right third toenail evaluated as she feels it looks abnormal and seems like it might be lifting towards the end.  Denies any drainage coming from the toenail or subungual area.    She has not been taking any anti-inflammatories due to history of gastric ulcer.  Past Medical History:  Diagnosis Date   Abnormal Pap smear of cervix 1996   HPV +   Anxiety    Asthma    Chicken pox    Depression    Genital warts    GERD (gastroesophageal reflux disease)    History of diverticulitis    History of genital warts    Hx: UTI (urinary tract infection)    Migraines    Urine incontinence     Past Surgical History:  Procedure Laterality Date   CERVICAL BIOPSY  W/ LOOP ELECTRODE EXCISION  1996   CHOLECYSTECTOMY  1997   HERNIA REPAIR  1976   TONSILLECTOMY AND ADENOIDECTOMY  1996   urethral stretching  1980    Allergies  Allergen Reactions   Cashew Nut Oil    Milk-Related Compounds Diarrhea   Wheat    Cephalosporins Hives and Rash   Penicillins Hives and Rash    Review of Systems  Cardiovascular:  Positive for leg swelling.  Musculoskeletal:  Positive for joint pain.  All other systems reviewed and are negative.     Physical Exam: General: The patient is alert and oriented x3 in no acute distress.  Dermatology: Skin is warm, dry and supple bilateral lower extremities. Interspaces are clear of maceration and debris.  No ecchymosis or erythema is noted.  Right third toenail appears thickened compared to the adjacent toenails when looking at the nail from the distal aspect.  Unable to ascertain and evaluate color and morphology of the nail due to dark nail polish being on the toenails today.  Vascular: Palpable pedal pulses bilaterally. Capillary refill within normal limits.  +1 pitting edema bilateral lower legs and ankles/rear foot.  Neurological: Light touch sensation grossly intact bilateral feet.   Musculoskeletal Exam: Pain on palpation of dorsal midtarsal joints bilateral.  Pain on palpation posterior aspect of left heel.  No gaps or nodules are noted within the tendo Achilles.  Ankle dorsiflexion less than 10 degrees with the knee extended on the left.  No crepitus with range of motion of the ankle.  Radiographic Exam (bilateral foot, 3 weightbearing views, 07/29/2023):  Left foot: Normal osseous mineralization.  Dorsal spurring at the talonavicular joint and the anterior tibial plafond.  Inferior and posterior calcaneal spurs noted.  Bipartite tibial sesamoid noted.  Mild increase in hallux abductus angle.  Medial  angulation of fifth toe.  No fracture seen  Right foot: Decreased calcaneal inclination angle noted.  Inferior and posterior calcaneal spurs noted.  Dorsal spurring seen at the navicular-medial cuneiform joint.  Mild increase in first intermetatarsal angle with tibial sesamoid position of 4.  No fracture seen  Assessment/Plan of Care: 1. 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 Capsulitis of foot   3. Achilles tendinitis of left lower extremity   4. Pain in joints of both feet   5. Generalized edema      Meds ordered this encounter  Medications   meloxicam  (MOBIC ) 7.5 MG tablet     Sig: Take 1 tablet (7.5 mg total) by mouth daily.    Dispense:  30 tablet    Refill:  0   AMB REFERRAL TO PHYSICAL THERAPY  Discussed clinical and radiographic findings with patient today.  Due to the multiple areas of pain in both feet, will prescribe meloxicam  7.5 mg 1 tablet p.o. daily.  If patient starts having any gastric discomfort, she can stop the medication.  I do not feel a single corticosteroid injection could address all of the various areas of discomfort today.  I feel the patient would best benefit from a formal course of physical therapy.  Some of the areas of discomfort may be compensatory for primary areas of pain and inflammation.  She can be evaluated for any muscular deficits and address any biomechanical abnormalities as well.  Patient was fitted for power step inserts size 11 to wear in her shoes at all times for support.  Discussed break-in process.  She can take the inserts from shoe to shoe.  She is needs to make sure she takes the original insert out of her shoe before she puts these inserts then.  Recommended the patient start with a mild compression knee-high stocking, 10 to 15 mmHg.  She can get these off of the [employer] or stop at any durable medical equipment store in the area to the fitted and dispensed these devices  Discussed shoe gear with the patient today.  Informed the patient she is getting need to take the toenail polish off to have the right third toenail thoroughly evaluated.  We can do this at her follow-up visit in the next 4 to 6 weeks.   Awanda CHARM Imperial, DPM, FACFAS [facility]     [address] [place], [place] [address]                Office [phone]  Fax [phone]

## 2023-08-05 ENCOUNTER — Other Ambulatory Visit (HOSPITAL_BASED_OUTPATIENT_CLINIC_OR_DEPARTMENT_OTHER): Payer: Self-pay | Admitting: *Deleted

## 2023-08-05 ENCOUNTER — Encounter (HOSPITAL_BASED_OUTPATIENT_CLINIC_OR_DEPARTMENT_OTHER): Payer: Self-pay | Admitting: Obstetrics & Gynecology

## 2023-08-05 DIAGNOSIS — R110 Nausea: Secondary | ICD-10-CM

## 2023-08-05 MED ORDER — ONDANSETRON HCL 4 MG PO TABS: 4.0000 mg | ORAL_TABLET | Freq: Three times a day (TID) | ORAL | 0 refills | Status: AC | PRN

## 2023-08-06 ENCOUNTER — Other Ambulatory Visit (HOSPITAL_COMMUNITY): Payer: Self-pay

## 2023-08-15 ENCOUNTER — Telehealth: Payer: BC Managed Care – PPO | Admitting: Nurse Practitioner

## 2023-08-15 ENCOUNTER — Encounter: Payer: Self-pay | Admitting: Nurse Practitioner

## 2023-08-15 DIAGNOSIS — G4733 Obstructive sleep apnea (adult) (pediatric): Secondary | ICD-10-CM
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
## 2023-08-15 NOTE — Assessment & Plan Note (Signed)
Moderate OSA. Reviewed risks of untreated OSA and potential treatment options. Shared decision to move forward with CPAP. Educated on proper use/care of device. Risks/benefits reviewed. Orders placed for auto CPAP 5-15 cmH2O, mask of choice and heated humidity. Safe driving practices reviewed. Healthy weight loss encouraged.  Patient Instructions  Start CPAP every night, minimum of 4-6 hours a night.  Change equipment as directed. Wash your tubing with warm soap and water daily, hang to dry. Wash humidifier portion weekly. Use bottled, distilled water and change daily Be aware of reduced alertness and do not drive or operate heavy machinery if experiencing this or drowsiness.  Exercise encouraged, as tolerated. Healthy weight management discussed.  Avoid or decrease alcohol consumption and medications that make you more sleepy, if possible. Notify if persistent daytime sleepiness occurs even with consistent use of PAP therapy.  We discussed how untreated sleep apnea puts an individual at risk for cardiac arrhthymias, pulm HTN, DM, stroke and increases their risk for daytime accidents. We also briefly reviewed treatment options including weight loss, side sleeping position, oral appliance, CPAP therapy or referral to ENT for possible surgical options  Change CPAP supplies... Every month Mask cushions and/or nasal pillows CPAP machine filters Every 3 months Mask frame (not including the headgear) CPAP tubing Every 6 months Mask headgear Chin strap (if applicable) Humidifier water tub  Call if you haven't heard from the medical supply company in the next 2-3 weeks  Work on healthy weight loss measures  Follow up in 10-12 weeks with Florentina Addison Benjimin Hadden,NP, or sooner, if needed

## 2023-08-15 NOTE — Patient Instructions (Signed)
Start CPAP every night, minimum of 4-6 hours a night.  Change equipment as directed. Wash your tubing with warm soap and water daily, hang to dry. Wash humidifier portion weekly. Use bottled, distilled water and change daily Be aware of reduced alertness and do not drive or operate heavy machinery if experiencing this or drowsiness.  Exercise encouraged, as tolerated. Healthy weight management discussed.  Avoid or decrease alcohol consumption and medications that make you more sleepy, if possible. Notify if persistent daytime sleepiness occurs even with consistent use of PAP therapy.  We discussed how untreated sleep apnea puts an individual at risk for cardiac arrhthymias, pulm HTN, DM, stroke and increases their risk for daytime accidents. We also briefly reviewed treatment options including weight loss, side sleeping position, oral appliance, CPAP therapy or referral to ENT for possible surgical options  Change CPAP supplies... Every month Mask cushions and/or nasal pillows CPAP machine filters Every 3 months Mask frame (not including the headgear) CPAP tubing Every 6 months Mask headgear Chin strap (if applicable) Humidifier water tub  Call if you haven't heard from the medical supply company in the next 2-3 weeks  Work on healthy weight loss measures  Follow up in 10-12 weeks with Alexa Addison Montrice Montuori,NP, or sooner, if needed

## 2023-08-25 ENCOUNTER — Ambulatory Visit: Payer: BC Managed Care – PPO | Admitting: Family Medicine

## 2023-08-25 ENCOUNTER — Ambulatory Visit (INDEPENDENT_AMBULATORY_CARE_PROVIDER_SITE_OTHER): Payer: BC Managed Care – PPO

## 2023-08-25 ENCOUNTER — Encounter: Payer: Self-pay | Admitting: Family Medicine

## 2023-08-25 VITALS — BP 110/64 | HR 62

## 2023-08-25 DIAGNOSIS — M654 Radial styloid tenosynovitis [de Quervain]: Secondary | ICD-10-CM

## 2023-08-25 DIAGNOSIS — Z9049 Acquired absence of other specified parts of digestive tract: Secondary | ICD-10-CM

## 2023-08-25 DIAGNOSIS — R2232 Localized swelling, mass and lump, left upper limb: Secondary | ICD-10-CM

## 2023-08-25 DIAGNOSIS — R221 Localized swelling, mass and lump, neck: Secondary | ICD-10-CM

## 2023-08-25 DIAGNOSIS — G4733 Obstructive sleep apnea (adult) (pediatric): Secondary | ICD-10-CM | POA: Diagnosis not present

## 2023-08-25 DIAGNOSIS — M79602 Pain in left arm: Secondary | ICD-10-CM | POA: Diagnosis not present

## 2023-08-25 DIAGNOSIS — R1011 Right upper quadrant pain: Secondary | ICD-10-CM

## 2023-08-25 DIAGNOSIS — G5603 Carpal tunnel syndrome, bilateral upper limbs: Secondary | ICD-10-CM

## 2023-08-25 DIAGNOSIS — R079 Chest pain, unspecified: Secondary | ICD-10-CM

## 2023-08-25 DIAGNOSIS — M25512 Pain in left shoulder: Secondary | ICD-10-CM | POA: Diagnosis not present

## 2023-08-25 DIAGNOSIS — M19012 Primary osteoarthritis, left shoulder: Secondary | ICD-10-CM | POA: Diagnosis not present

## 2023-08-25 DIAGNOSIS — T148XXA Other injury of unspecified body region, initial encounter: Secondary | ICD-10-CM

## 2023-08-25 LAB — CBC WITH DIFFERENTIAL/PLATELET
Basophils Absolute: 0.1 10*3/uL (ref 0.0–0.1)
Basophils Relative: 0.9 % (ref 0.0–3.0)
Eosinophils Absolute: 0.8 10*3/uL — ABNORMAL HIGH (ref 0.0–0.7)
Eosinophils Relative: 10.6 % — ABNORMAL HIGH (ref 0.0–5.0)
HCT: 41 % (ref 36.0–46.0)
Hemoglobin: 13.6 g/dL (ref 12.0–15.0)
Lymphocytes Relative: 32.4 % (ref 12.0–46.0)
Lymphs Abs: 2.6 10*3/uL (ref 0.7–4.0)
MCHC: 33.2 g/dL (ref 30.0–36.0)
MCV: 86.6 fL (ref 78.0–100.0)
Monocytes Absolute: 0.6 10*3/uL (ref 0.1–1.0)
Monocytes Relative: 7.5 % (ref 3.0–12.0)
Neutro Abs: 3.9 10*3/uL (ref 1.4–7.7)
Neutrophils Relative %: 48.6 % (ref 43.0–77.0)
Platelets: 260 10*3/uL (ref 150.0–400.0)
RBC: 4.73 Mil/uL (ref 3.87–5.11)
RDW: 13.9 % (ref 11.5–15.5)
WBC: 8 10*3/uL (ref 4.0–10.5)

## 2023-08-25 LAB — AMYLASE: Amylase: 59 U/L (ref 27–131)

## 2023-08-25 LAB — COMPREHENSIVE METABOLIC PANEL
ALT: 38 U/L — ABNORMAL HIGH (ref 0–35)
AST: 50 U/L — ABNORMAL HIGH (ref 0–37)
Albumin: 4 g/dL (ref 3.5–5.2)
Alkaline Phosphatase: 90 U/L (ref 39–117)
BUN: 10 mg/dL (ref 6–23)
CO2: 28 meq/L (ref 19–32)
Calcium: 9.2 mg/dL (ref 8.4–10.5)
Chloride: 103 meq/L (ref 96–112)
Creatinine, Ser: 0.76 mg/dL (ref 0.40–1.20)
GFR: 90.67 mL/min (ref >60.00)
Glucose, Bld: 109 mg/dL — ABNORMAL HIGH (ref 70–99)
Potassium: 3.8 meq/L (ref 3.5–5.1)
Sodium: 139 meq/L (ref 135–145)
Total Bilirubin: 0.4 mg/dL (ref 0.2–1.2)
Total Protein: 7.2 g/dL (ref 6.0–8.3)

## 2023-08-25 LAB — LIPASE: Lipase: 56 U/L (ref 11.0–59.0)

## 2023-08-25 MED ORDER — DICLOFENAC SODIUM 1 % EX GEL: 2.0000 g | Freq: Four times a day (QID) | CUTANEOUS | 1 refills | Status: DC

## 2023-08-25 NOTE — Progress Notes (Signed)
Acute Office Visit  Subjective:     Patient ID: Alexa West, female    DOB: 09/22/1970, 53 y.o.   MRN: [id number]  Chief Complaint  Patient presents with   Acute Visit    Bilateral, left arm is worse, chest pain on the right side. Has had EKG's, and mammograms done with no answers. Ongoing for about 4 months. Took baby aspirin around 12pm, avoids NSAIDS due to ulcer history.    HPI Patient is in today for multiple complaints. Reports that she has been having chest pain through to her back intermittently for the last few months. Denies that the pain is worse with movement or deep breathing. Has had EKG, chest x-ray, abdominal CT, all were unrevealing. Reports left upper arm pain also intermittently for the last few months. Reports bilateral carpal tunnel with wrist and hand numbness intermittently. Reports bilateral thumb pain along with wrist pain. Reports that she crochets and does crafts as hobby. Has not seen sports medicine orthopedics for this in the past. Has not had shoulder imaged. Reports decreased grip strength bilaterally, denies decreased arm strength. Denies other concerns today  ROS Per HPI      Objective:    BP 110/64 (BP Location: Left Arm, Patient Position: Sitting, Cuff Size: Large)   Pulse 62   LMP 02/25/2023   SpO2 99%    Physical Exam Vitals and nursing note reviewed.  Constitutional:      General: She is not in acute distress.    Appearance: Normal appearance. She is obese.  HENT:     Head: Normocephalic and atraumatic.     Right Ear: Tympanic membrane and ear canal normal.     Left Ear: Tympanic membrane and ear canal normal.     Nose: Nose normal. No congestion or rhinorrhea.     Mouth/Throat:     Mouth: Mucous membranes are moist.     Pharynx: Oropharynx is clear. Posterior oropharyngeal erythema present.  Eyes:     Extraocular Movements: Extraocular movements intact.  Cardiovascular:     Rate and Rhythm: Normal rate and  regular rhythm.     Pulses: Normal pulses.     Heart sounds: Normal heart sounds.  Pulmonary:     Effort: Pulmonary effort is normal. No respiratory distress.     Breath sounds: Normal breath sounds. No wheezing, rhonchi or rales.  Musculoskeletal:        General: Tenderness present.     Cervical back: Normal range of motion.     Comments: + Tinel's sign bilaterally + Finklestein's bilaterally Tenderness to LUE at L deltoid, worse with movement, FROM No erythema, no bruising, no obvious deformities  Lymphadenopathy:     Cervical: No cervical adenopathy.  Skin:    Comments: Mass to L neck, mass to L forearm. Both are soft, non tender, non erythematous, no heat, no bruising noted  Neurological:     General: No focal deficit present.     Mental Status: She is alert and oriented to person, place, and time.  Psychiatric:        Mood and Affect: Mood normal.        Thought Content: Thought content normal.     No results found for any visits on 08/25/23. EKG: normal EKG, normal sinus rhythm, unchanged from previous tracings.       Assessment & Plan:  1. Chest pain, unspecified type (Primary)  - EKG 12-Lead - CBC with Differential/Platelet  2. Left arm pain  - DG  Shoulder Left; Future - diclofenac Sodium (VOLTAREN ARTHRITIS PAIN) 1 % GEL; Apply 2 g topically 4 (four) times daily.  Dispense: 100 g; Refill: 1  3. Mass in neck  - CBC with Differential/Platelet - US SOFT TISSUE HEAD & NECK (NON-THYROID); Future  4. Mass of arm, left  - US SOFT TISSUE HEAD & NECK (NON-THYROID); Future  5. Right upper quadrant abdominal pain  - CBC with Differential/Platelet - Comprehensive metabolic panel - Amylase - Lipase  6. History of cholecystectomy  -Lipase -Amylase  7. De Quervain's tenosynovitis, bilateral  - May sleep in wrist braces with thumb spica -F/u with sports medicine as needed  8. Carpal tunnel syndrome on both sides  - May sleep in wrist braces with thumb  spica -F/u with sports medicine as needed  9. Muscle strain  - May use voltaren gel as needed -F/u sports med as needed   Meds ordered this encounter  Medications   diclofenac Sodium (VOLTAREN ARTHRITIS PAIN) 1 % GEL    Sig: Apply 2 g topically 4 (four) times daily.    Dispense:  100 g    Refill:  1    Return if symptoms worsen or fail to improve.  Moshe Cipro, FNP

## 2023-08-25 NOTE — Patient Instructions (Addendum)
Pure Encapsulations: Gallbladder digestion  We are checking labs today, will be in contact with any results that require further attention  We are getting an xray today. We will be in contact with any abnormal results that require further attention.  Follow-up with me for new or worsening symptoms.

## 2023-08-26 ENCOUNTER — Encounter: Payer: Self-pay | Admitting: Family Medicine

## 2023-08-28 ENCOUNTER — Ambulatory Visit: Payer: BC Managed Care – PPO | Admitting: Cardiovascular Disease

## 2023-09-02 ENCOUNTER — Encounter: Payer: Self-pay | Admitting: Family Medicine

## 2023-09-04 ENCOUNTER — Ambulatory Visit (HOSPITAL_BASED_OUTPATIENT_CLINIC_OR_DEPARTMENT_OTHER)
Admission: RE | Admit: 2023-09-04 | Discharge: 2023-09-04 | Disposition: A | Discharge status: Home / Self Care | Payer: BC Managed Care – PPO | Source: Ambulatory Visit | Attending: Family Medicine | Admitting: Family Medicine

## 2023-09-04 DIAGNOSIS — R2232 Localized swelling, mass and lump, left upper limb: Secondary | ICD-10-CM | POA: Insufficient documentation

## 2023-09-04 DIAGNOSIS — R221 Localized swelling, mass and lump, neck: Secondary | ICD-10-CM | POA: Insufficient documentation

## 2023-09-05 ENCOUNTER — Encounter: Payer: Self-pay | Admitting: Family Medicine

## 2023-09-07 ENCOUNTER — Encounter: Payer: Self-pay | Admitting: Family Medicine

## 2023-09-09 ENCOUNTER — Encounter: Payer: BC Managed Care – PPO | Admitting: Podiatry

## 2023-09-09 ENCOUNTER — Other Ambulatory Visit: Payer: Self-pay | Admitting: Family Medicine

## 2023-09-09 NOTE — Progress Notes (Signed)
Patient did not show for his scheduled appointment this morning

## 2023-09-10 ENCOUNTER — Other Ambulatory Visit: Payer: Self-pay | Admitting: Family Medicine

## 2023-09-10 DIAGNOSIS — R2232 Localized swelling, mass and lump, left upper limb: Secondary | ICD-10-CM

## 2023-09-12 DIAGNOSIS — L309 Dermatitis, unspecified: Secondary | ICD-10-CM | POA: Diagnosis not present

## 2023-09-12 DIAGNOSIS — L719 Rosacea, unspecified: Secondary | ICD-10-CM | POA: Diagnosis not present

## 2023-09-12 DIAGNOSIS — D2262 Melanocytic nevi of left upper limb, including shoulder: Secondary | ICD-10-CM | POA: Diagnosis not present

## 2023-09-12 DIAGNOSIS — L720 Epidermal cyst: Secondary | ICD-10-CM | POA: Diagnosis not present

## 2023-09-15 ENCOUNTER — Ambulatory Visit (HOSPITAL_BASED_OUTPATIENT_CLINIC_OR_DEPARTMENT_OTHER)
Admission: RE | Admit: 2023-09-15 | Discharge: 2023-09-15 | Disposition: A | Discharge status: Home / Self Care | Payer: BC Managed Care – PPO | Source: Ambulatory Visit | Attending: Family Medicine | Admitting: Family Medicine

## 2023-09-15 DIAGNOSIS — R2232 Localized swelling, mass and lump, left upper limb: Secondary | ICD-10-CM | POA: Diagnosis not present

## 2023-09-15 DIAGNOSIS — R2231 Localized swelling, mass and lump, right upper limb: Secondary | ICD-10-CM | POA: Diagnosis not present

## 2023-09-16 ENCOUNTER — Encounter: Payer: Self-pay | Admitting: Family Medicine

## 2023-09-25 DIAGNOSIS — G4733 Obstructive sleep apnea (adult) (pediatric): Secondary | ICD-10-CM | POA: Diagnosis not present

## 2023-09-29 IMAGING — DX DG WRIST 2V*L*
2 series · 2 of 2 positions shown · non-contrast
Comparison: None.

CLINICAL DATA: Pain

EXAM:
LEFT WRIST - 2 VIEW

[wrist pa]
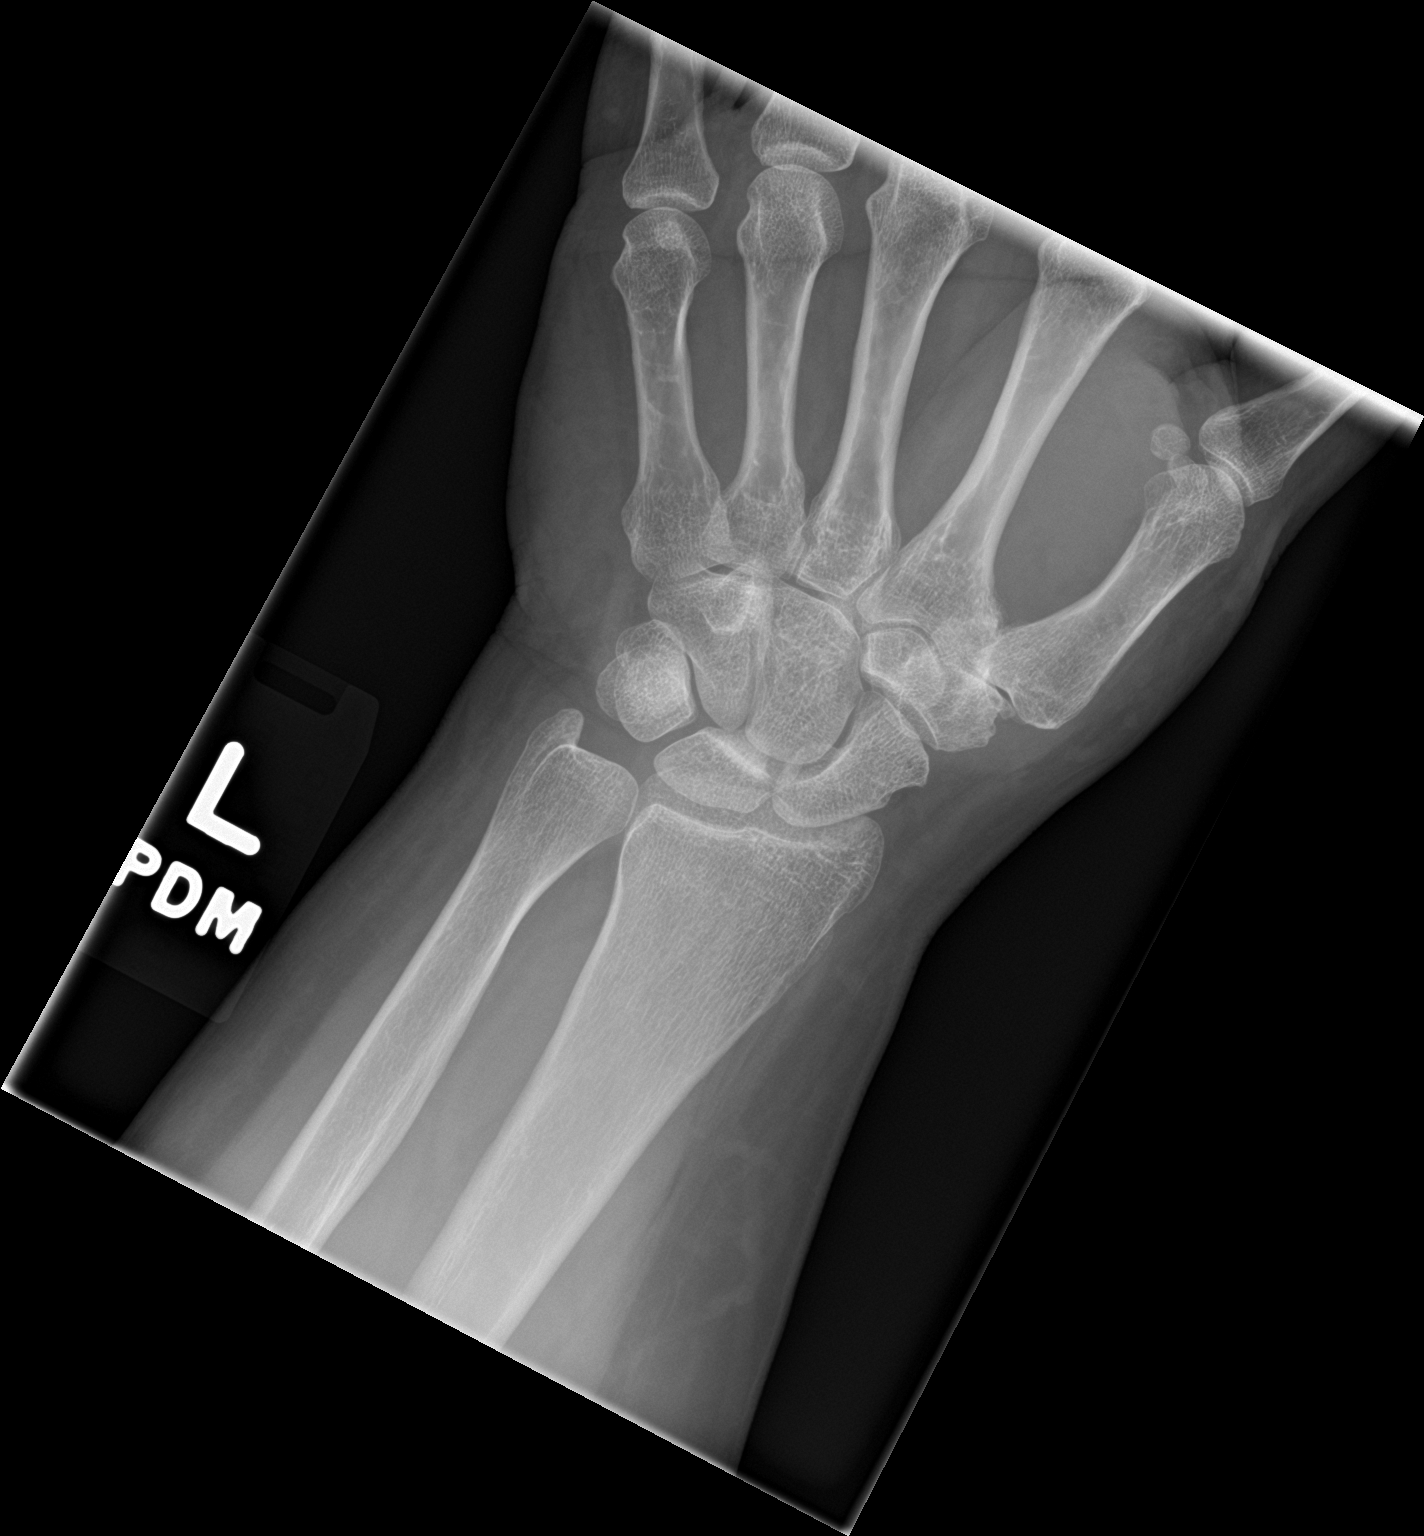

[wrist lat]
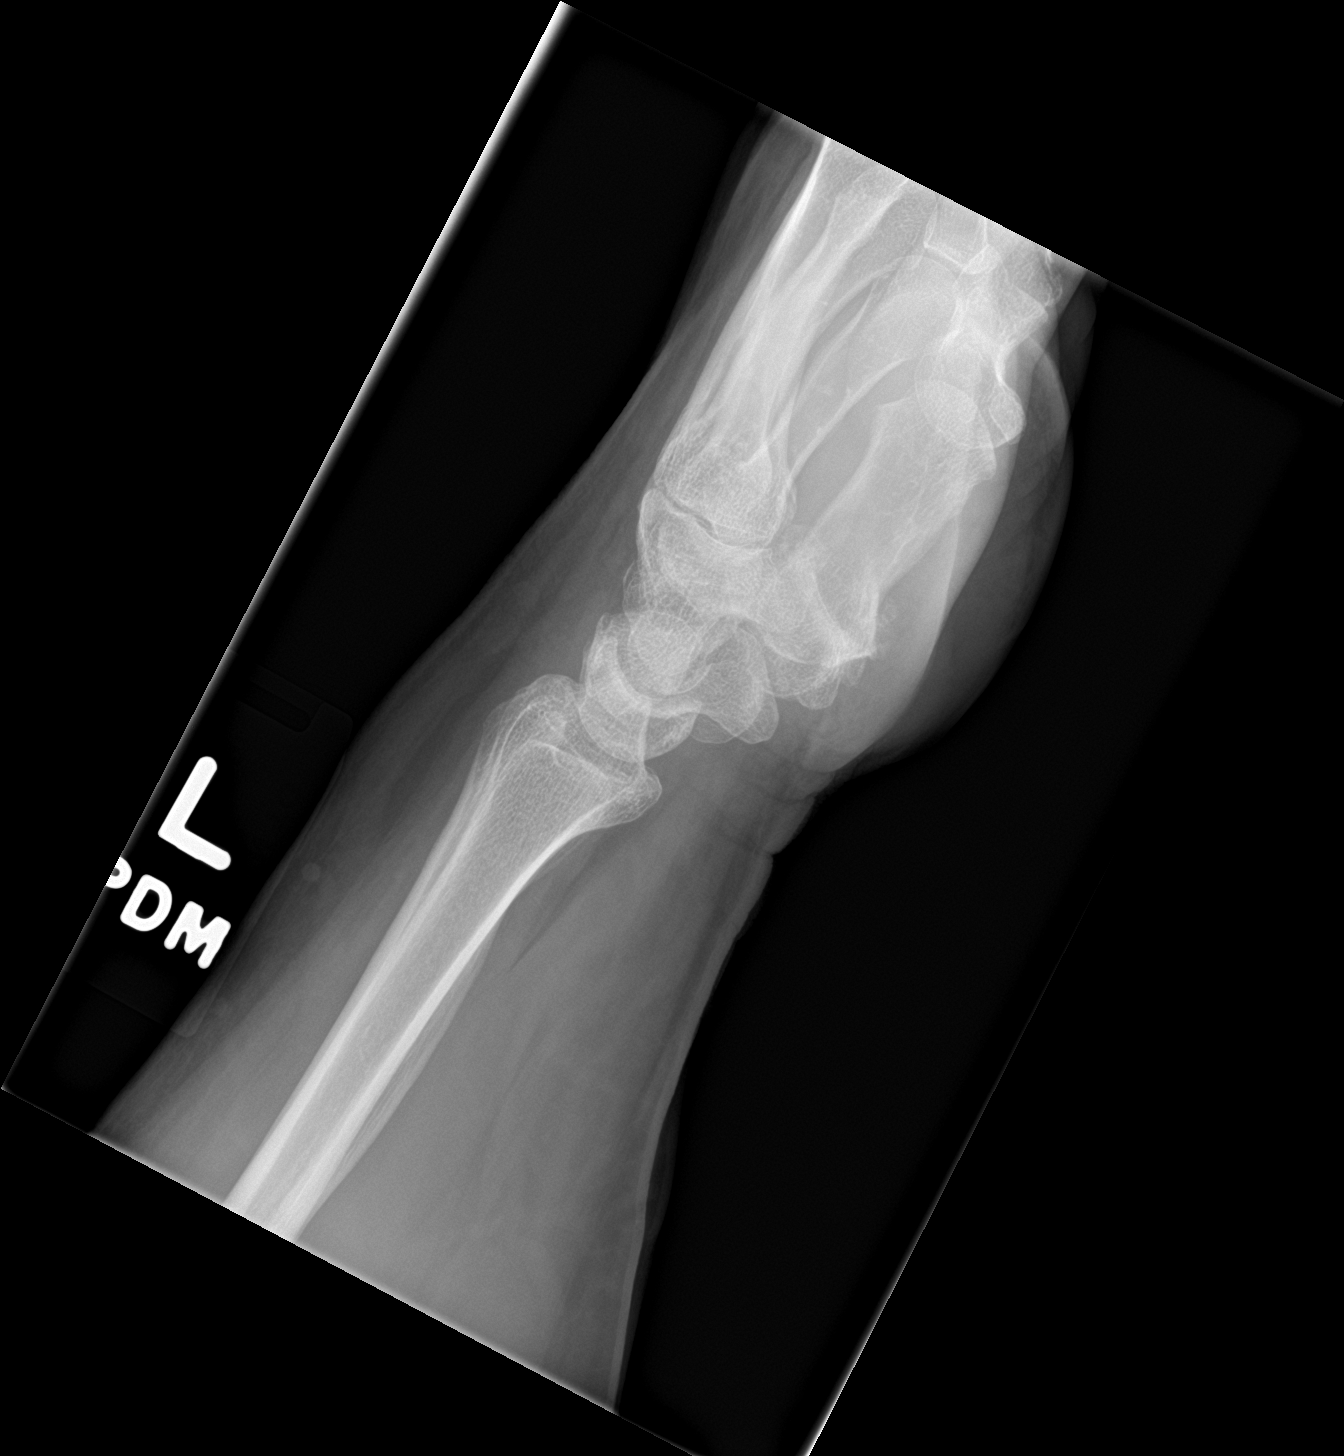

[2 of 2 positions shown; findings below may reference images not displayed]

FINDINGS: No fracture or dislocation is seen. Moderate to marked degenerative
changes are noted in first carpometacarpal joint.
IMPRESSION: No recent fracture or dislocation is seen in the left wrist.
Degenerative changes are noted in first carpometacarpal joint.

## 2023-09-29 IMAGING — DX DG FINGER THUMB 2+V*L*
3 series · 3 of 3 positions shown · non-contrast
Comparison: None.

CLINICAL DATA: Pain left thumb

EXAM:
LEFT THUMB 2+V

[finger ap]
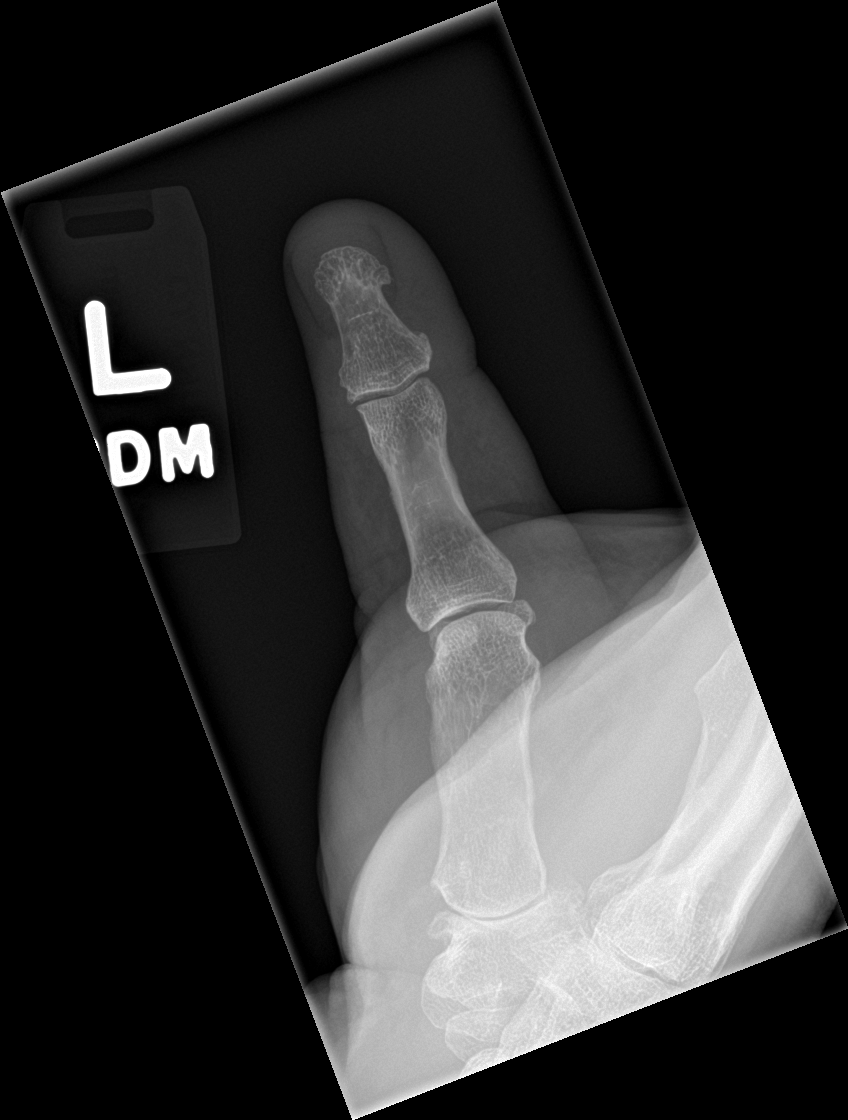

[finger obl]
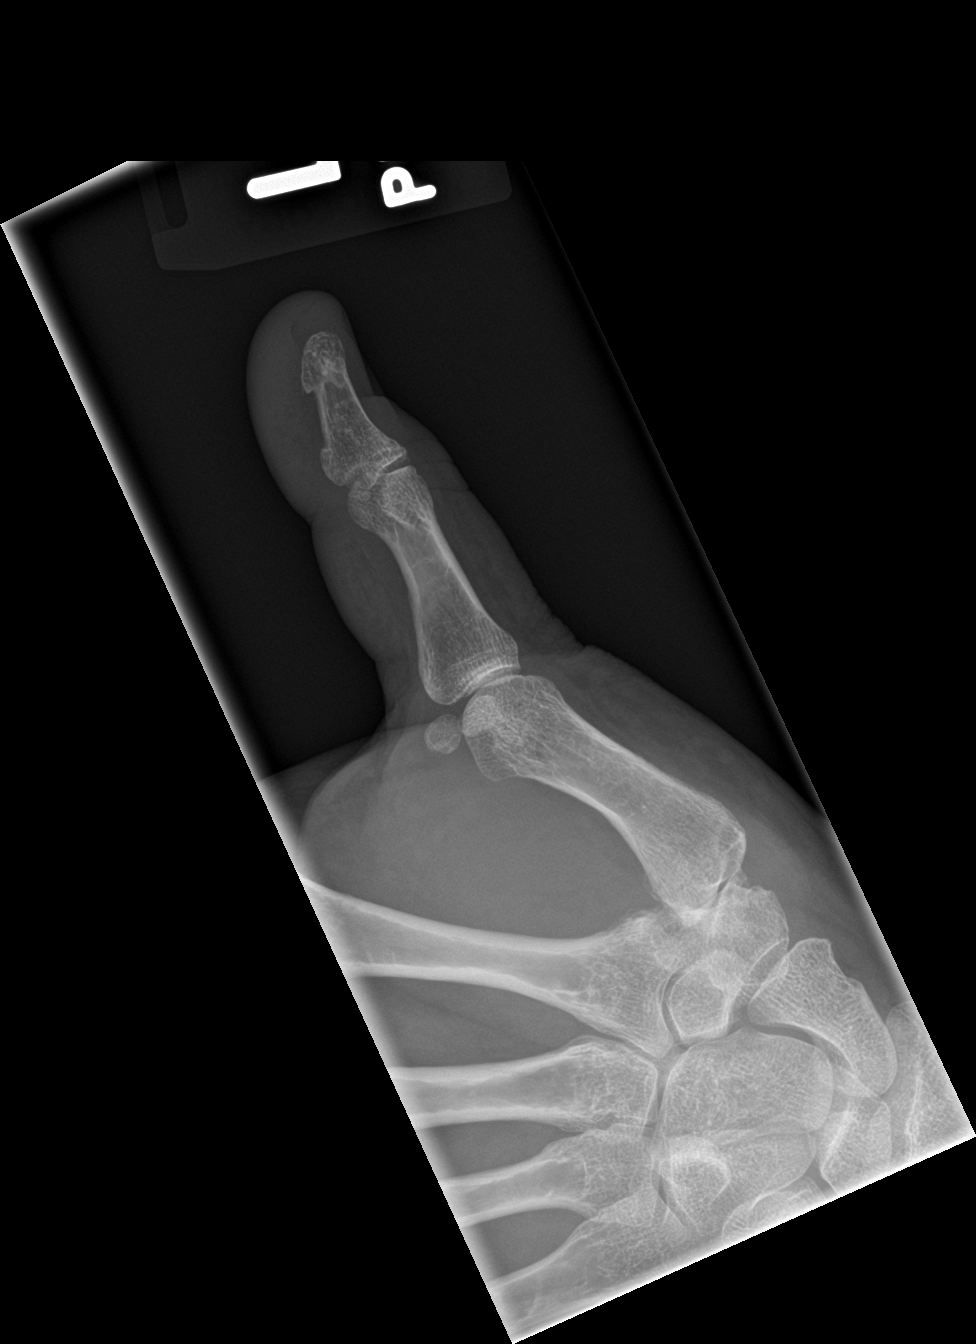

[finger lat]
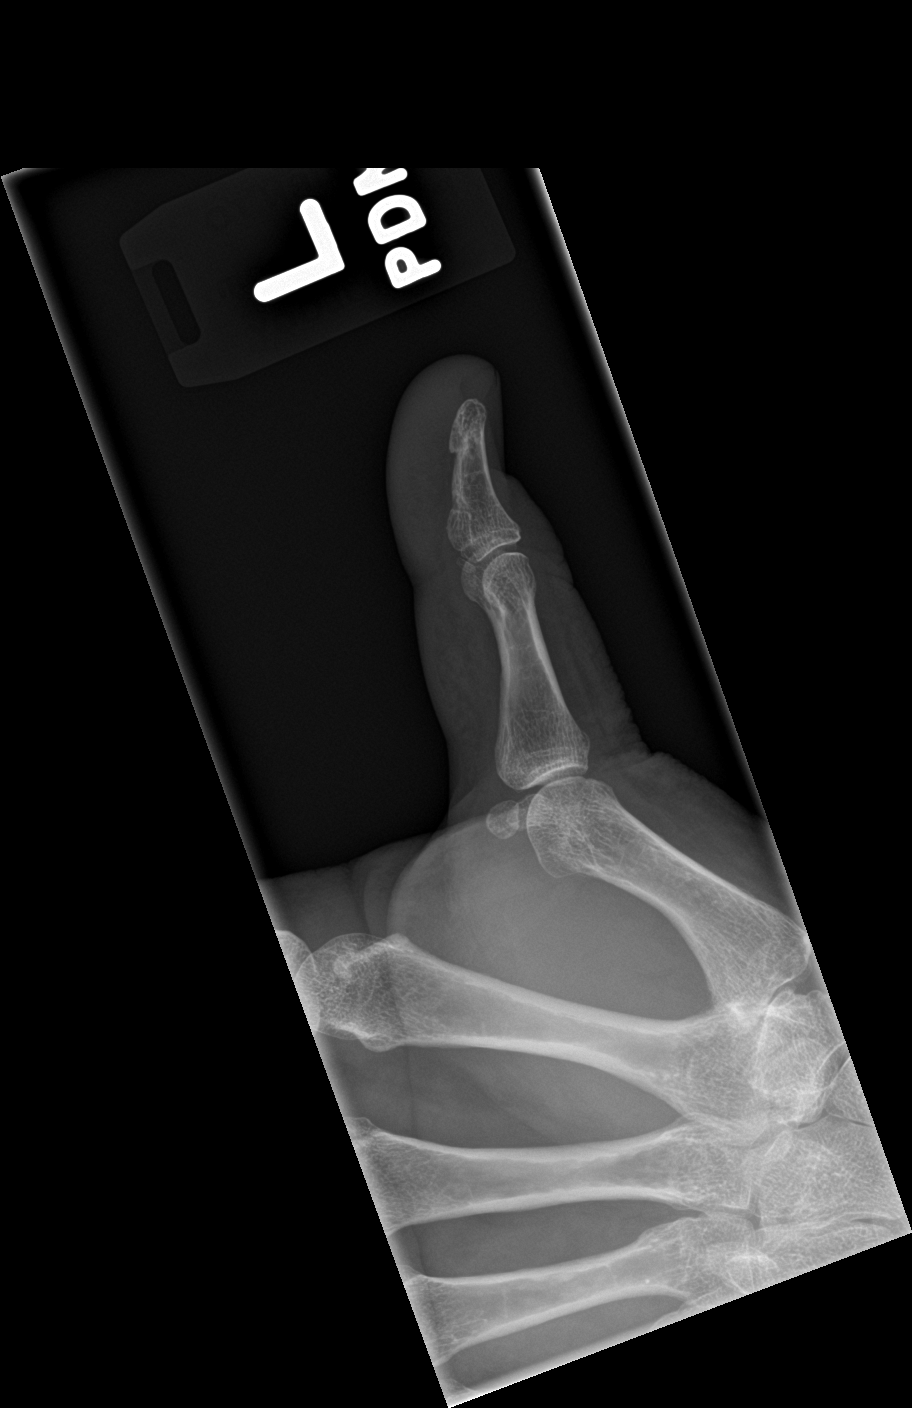

[3 of 3 positions shown; findings below may reference images not displayed]

FINDINGS: No recent fracture or dislocation is seen. Degenerative changes are
noted with joint space narrowing and bony spurs in first
carpometacarpal joint. Small bony spurs seen in the interphalangeal
joint of the left thumb.
IMPRESSION: No fracture or dislocation is seen. Degenerative changes are noted,
more so in the first carpometacarpal joint.

## 2023-10-01 ENCOUNTER — Ambulatory Visit: Admitting: Family Medicine

## 2023-10-01 NOTE — Progress Notes (Signed)
 Acute Office Visit  Subjective:     Patient ID: Alexa West, female    DOB: Jan 28, 1971, 53 y.o.   MRN: [id number]  Chief Complaint  Patient presents with   Acute Visit    Rash, first noticed about 3 weeks, bilateral arms, chest, bilateral legs. Itches, red, and raised    HPI Patient is in today for evaluation of rash to bilateral forearms, bilateral hips and thighs, as well as chest and neck. Reports that the rash is red, raised and itchy. Has been using triamcinolone cream with little relief. States that the rash has been present for the last 2 weeks. States has taken Benadryl and Zyrtec with mild relief. Reports that she does have a new manufacturer for spironolactone, otherwise no new exposures. No shortness of breath, cough, URI symptoms, other symptoms.  Separately expressing concern about attention deficits. States that she has never been diagnosed with ADHD. Requesting evaluation.  ROS Per HPI      Objective:    BP 128/80 (BP Location: Left Arm, Patient Position: Sitting)   Pulse 63   Temp 98.4 F (36.9 C) (Temporal)   Ht 5' 5.5" (1.664 m)   Wt (!) 321 lb 12.8 oz (146 kg)   LMP 02/25/2023   SpO2 96%   BMI 52.74 kg/m    Physical Exam Vitals and nursing note reviewed.  Constitutional:      General: She is not in acute distress.    Appearance: Normal appearance. She is obese.  HENT:     Head: Normocephalic and atraumatic.     Nose: Nose normal.  Eyes:     Extraocular Movements: Extraocular movements intact.     Pupils: Pupils are equal, round, and reactive to light.  Cardiovascular:     Rate and Rhythm: Normal rate.  Pulmonary:     Effort: Pulmonary effort is normal.  Musculoskeletal:        General: Normal range of motion.     Cervical back: Normal range of motion.  Lymphadenopathy:     Cervical: No cervical adenopathy.  Skin:    Comments: See photo  Neurological:     General: No focal deficit present.     Mental Status: She is alert  and oriented to person, place, and time.  Psychiatric:        Mood and Affect: Mood normal.        Thought Content: Thought content normal.    No results found for any visits on 10/03/23.      Assessment & Plan:   1. Other atopic dermatitis (Primary)  - predniSONE (DELTASONE) 20 MG tablet; Take 2 tablets (40 mg total) by mouth daily for 5 days.  Dispense: 10 tablet; Refill: 0 - clobetasol cream (TEMOVATE) 0.05 %; Apply 1 Application topically 2 (two) times daily.  Dispense: 45 g; Refill: 1  2. Attention deficit  - Ambulatory referral to Psychiatry  3. Morbid obesity with BMI of 50.0-59.9, adult (HCC)  Discussed diet, CPAP usage may also help    Meds ordered this encounter  Medications   predniSONE (DELTASONE) 20 MG tablet    Sig: Take 2 tablets (40 mg total) by mouth daily for 5 days.    Dispense:  10 tablet    Refill:  0   clobetasol cream (TEMOVATE) 0.05 %    Sig: Apply 1 Application topically 2 (two) times daily.    Dispense:  45 g    Refill:  1    Return if symptoms worsen or  fail to improve.  Moshe Cipro, FNP

## 2023-10-03 ENCOUNTER — Encounter: Payer: Self-pay | Admitting: Family Medicine

## 2023-10-03 ENCOUNTER — Ambulatory Visit: Admitting: Family Medicine

## 2023-10-03 VITALS — BP 128/80 | HR 63 | Temp 98.4°F | Ht 65.5 in | Wt 321.8 lb

## 2023-10-03 DIAGNOSIS — R41840 Attention and concentration deficit: Secondary | ICD-10-CM

## 2023-10-03 DIAGNOSIS — Z6841 Body Mass Index (BMI) 40.0 and over, adult: Secondary | ICD-10-CM | POA: Diagnosis not present

## 2023-10-03 DIAGNOSIS — L2089 Other atopic dermatitis: Secondary | ICD-10-CM

## 2023-10-03 MED ORDER — PREDNISONE 20 MG PO TABS: 40.0000 mg | ORAL_TABLET | Freq: Every day | ORAL | 0 refills | Status: AC

## 2023-10-03 MED ORDER — CLOBETASOL PROPIONATE 0.05 % EX CREA: 1.0000 | TOPICAL_CREAM | Freq: Two times a day (BID) | CUTANEOUS | 1 refills | Status: DC

## 2023-10-03 NOTE — Assessment & Plan Note (Signed)
 Discussed diet, CPAP usage may also help

## 2023-10-03 NOTE — Patient Instructions (Signed)
 I have sent in prednisone for you to take 2 tablets once daily in the morning with breakfast for the next 5 days.  I have sent in clobetasol cream for you to use to the areas that are itching.  May mix this with your moisturizer or with Vaseline or Aquaphor.  Records release sent to [facility] today.  I have sent in a referral to psych for an ADHD evaluation for you.  Follow-up with me for new or worsening symptoms.

## 2023-10-13 ENCOUNTER — Ambulatory Visit: Payer: BC Managed Care – PPO | Admitting: Cardiology

## 2023-10-23 DIAGNOSIS — G4733 Obstructive sleep apnea (adult) (pediatric): Secondary | ICD-10-CM | POA: Diagnosis not present

## 2023-10-24 ENCOUNTER — Ambulatory Visit: Admitting: Family Medicine

## 2023-10-24 ENCOUNTER — Encounter: Payer: Self-pay | Admitting: Family Medicine

## 2023-10-24 VITALS — BP 120/82 | HR 71 | Temp 98.3°F | Ht 65.5 in | Wt 321.4 lb

## 2023-10-24 DIAGNOSIS — L2089 Other atopic dermatitis: Secondary | ICD-10-CM

## 2023-10-24 DIAGNOSIS — L03113 Cellulitis of right upper limb: Secondary | ICD-10-CM

## 2023-10-24 MED ORDER — DOXYCYCLINE HYCLATE 100 MG PO TABS: 100.0000 mg | ORAL_TABLET | Freq: Two times a day (BID) | ORAL | 0 refills | Status: AC

## 2023-10-24 NOTE — Progress Notes (Signed)
 Acute Office Visit  Subjective:     Patient ID: Alexa West, female    DOB: 1971-06-06, 53 y.o.   MRN: [id number]  Chief Complaint  Patient presents with   Rash    Still present from 03/07 visit, neck/chest rash itchy, spot on R arm possibly infected, spot on left hip itchy    HPI Patient is in today for re evaluation of continued rash.  Has scratched and caused redness, pain swelling to a lesion on the R forearm.  Has been using neosporin to the area with no relief.  States that rash improved since last visit on 10/03/23, during steroid treatment and then returned after completed course. Was treated with prednisone and clobetasol.  Has established dermatologist, has not seen her for this yet. Has not seen allergist. Denies new exposures, new medications, new clothing or detergents. Denies other concerns.   ROS Per HPI      Objective:    BP 120/82 (BP Location: Left Arm, Patient Position: Sitting)   Pulse 71   Temp 98.3 F (36.8 C) (Temporal)   Ht 5' 5.5" (1.664 m)   Wt (!) 321 lb 6.4 oz (145.8 kg)   LMP 02/25/2023   SpO2 98%   BMI 52.67 kg/m    Physical Exam Vitals and nursing note reviewed.  Constitutional:      General: She is not in acute distress.    Appearance: Normal appearance. She is normal weight.  HENT:     Head: Normocephalic and atraumatic.     Right Ear: External ear normal.     Left Ear: External ear normal.  Eyes:     Extraocular Movements: Extraocular movements intact.  Cardiovascular:     Rate and Rhythm: Normal rate and regular rhythm.     Pulses: Normal pulses.     Heart sounds: Normal heart sounds.  Pulmonary:     Effort: Pulmonary effort is normal. No respiratory distress.     Breath sounds: Normal breath sounds. No wheezing, rhonchi or rales.  Musculoskeletal:        General: Normal range of motion.     Cervical back: Normal range of motion.     Right lower leg: No edema.     Left lower leg: No edema.  Lymphadenopathy:      Cervical: No cervical adenopathy.  Skin:    Findings: Erythema and rash present.     Comments: See photo  Neurological:     General: No focal deficit present.     Mental Status: She is alert and oriented to person, place, and time.  Psychiatric:        Mood and Affect: Mood normal.        Thought Content: Thought content normal.    No results found for any visits on 10/24/23.      Assessment & Plan:   Other atopic dermatitis -     Ambulatory referral to Allergy  Cellulitis of right upper extremity -     Doxycycline Hyclate; Take 1 tablet (100 mg total) by mouth 2 (two) times daily for 7 days.  Dispense: 14 tablet; Refill: 0     Meds ordered this encounter  Medications   doxycycline (VIBRA-TABS) 100 MG tablet    Sig: Take 1 tablet (100 mg total) by mouth 2 (two) times daily for 7 days.    Dispense:  14 tablet    Refill:  0    Return if symptoms worsen or fail to improve.  Dinora Hemm L  Ashley Royalty, FNP

## 2023-10-24 NOTE — Patient Instructions (Signed)
 [facility]  [phone]  I have sent in a prescription for doxycycline for you to take twice a day for 7 days.  Take this medication with food as it can upset your stomach if you do not.  I have sent in a referral to an allergist today. Someone will be reaching out to get you scheduled.   Follow-up with me for new or worsening symptoms.

## 2023-10-27 ENCOUNTER — Encounter: Payer: Self-pay | Admitting: Family Medicine

## 2023-11-02 NOTE — Progress Notes (Unsigned)
 Cardiology Office Note:  .   Date:  11/03/2023  ID:  Alexa West, DOB 08/13/1970, MRN [id number] PCP: Sherald Barge, FNP  [facility] Providers Cardiologist:  None { History of Present Illness: .    Chief Complaint  Patient presents with   Chest Pain    Alexa West is a 53 y.o. female with history of obesity who presents for the evaluation of orthostatic hypotension at the request of Sherald Barge, FNP.   History of Present Illness   Alexa West is a 53 year old female who presents for evaluation of orthostatic hypotension and chest pain.  She experiences orthostatic hypotension, characterized by low blood pressure upon standing, which has been occurring intermittently for about a year. The episodes are not daily and can happen both at work and at home. She is unsure of any specific triggers and acknowledges not drinking enough water. No high blood pressure, diabetes, heart attack, or stroke. She works as a Pharmacologist, which involves standing for long periods.  She reports chest pain under her right breast, which began around October of the previous year. The pain is described as 'achy' and sometimes 'sharp', lasting for a couple of hours. It initially occurred consistently for a few months but has not bothered her recently. She underwent a colonoscopy and endoscopy in December, which did not reveal any inflammation or cause for the pain. The pain sometimes radiates to her back and is not associated with any specific triggers. She has a family history of heart disease, including her grandmother who had a heart attack, her grandfather who underwent triple bypass surgery, her mother with arrhythmia, and an uncle with an unspecified heart condition.  Her current medications include spironolactone, which she takes once a day, Zoloft, which she has not taken in the last few days, and albuterol and Maxalt as needed. She also recently completed  a course of doxycycline for an infected rash and uses clobetasol for skin issues. She notes that she does not consistently take her spironolactone, which was prescribed for PCOS. She has been diagnosed with sleep apnea, for which she uses a CPAP machine, and has non-alcoholic fatty liver disease and has been told she is prediabetic.          Problem List Obesity  -BMI 51 2. PCOS 3. GERD 4. Fatty liver  5. HLD -T chol 169, HDL 34, LDL 101, TG 198 6. OSA    ROS: All other ROS reviewed and negative. Pertinent positives noted in the HPI.     Studies Reviewed: Marland Kitchen   EKG Interpretation Date/Time:  Monday November 03 2023 13:39:33 EDT Ventricular Rate:  75 PR Interval:  190 QRS Duration:  96 QT Interval:  408 QTC Calculation: 455 R Axis:   14  Text Interpretation: Normal sinus rhythm Low voltage QRS Confirmed by Lennie Odor [phone]) on 11/03/2023 1:49:30 PM    Physical Exam:   VS:  BP 114/70   Pulse 75   Ht 5\' 6"  (1.676 m)   Wt (!) 319 lb (144.7 kg)   LMP 02/25/2023   SpO2 96%   BMI 51.49 kg/m    Wt Readings from Last 3 Encounters:  11/03/23 (!) 319 lb (144.7 kg)  10/24/23 (!) 321 lb 6.4 oz (145.8 kg)  10/03/23 (!) 321 lb 12.8 oz (146 kg)    GEN: Well nourished, well developed in no acute distress NECK: No JVD; No carotid bruits CARDIAC: RRR, no murmurs, rubs, gallops RESPIRATORY:  Clear to auscultation without rales, wheezing or rhonchi  ABDOMEN: Soft, non-tender, non-distended EXTREMITIES:  No edema; No deformity  ASSESSMENT AND PLAN: .   Assessment and Plan    Chest Pain Intermittent chest pain under the right breast, not believed to be cardiac in origin. Family history of heart disease and risk factors warrant further evaluation. EKG is normal.  - Order coronary CT angiography to evaluate heart arteries and exclude obstructive coronary artery disease. - Obtain basic metabolic panel (BMP) today. - Administer 100 mg of metoprolol tartrate on the day of the CT scan. -  Instruct not to take spironolactone on the day of the CT scan.  Orthostatic Hypotension Intermittent dizziness and lightheadedness likely exacerbated by spironolactone and inadequate hydration. EKG normal. Medication-related orthostatic intolerance suspected. - Encourage increased water intake, 60-80 ounces per day. - Advise caution with spironolactone use, especially regarding its potential to lower blood pressure. - Suggest taking spironolactone at night if continued for PCOS.  Polycystic Ovary Syndrome (PCOS) Spironolactone for PCOS may contribute to orthostatic hypotension. - Discussed the potential impact of spironolactone on blood pressure and advised taking it at night if continued for PCOS.  Obesity BMI 51, indicating morbid obesity. Risk factor for cardiovascular disease, may contribute to fatty liver and hyperlipidemia. - Discussed the importance of weight loss for overall health improvement and risk reduction.  Fatty Liver Disease Non-alcoholic fatty liver disease noted. Obesity is a contributing factor. - Consideration of cholesterol management pending results of coronary CT angiography.  Hyperlipidemia Potential hyperlipidemia suggested by risk factors and family history. Further evaluation needed. - Evaluate need for cholesterol medication based on coronary CT angiography results.              Follow-up: Return if symptoms worsen or fail to improve.   Signed, Lenna Gilford. Flora Lipps, MD, [facility]  [facility]  [address] [place], [place] [address] [phone]  2:27 PM

## 2023-11-03 ENCOUNTER — Ambulatory Visit: Payer: BC Managed Care – PPO | Attending: Cardiovascular Disease | Admitting: Cardiovascular Disease

## 2023-11-03 ENCOUNTER — Encounter: Payer: Self-pay | Admitting: Cardiovascular Disease

## 2023-11-03 VITALS — BP 114/70 | HR 75 | Ht 66.0 in | Wt 319.0 lb

## 2023-11-03 DIAGNOSIS — Z01812 Encounter for preprocedural laboratory examination: Secondary | ICD-10-CM

## 2023-11-03 DIAGNOSIS — I951 Orthostatic hypotension: Secondary | ICD-10-CM

## 2023-11-03 DIAGNOSIS — E782 Mixed hyperlipidemia: Secondary | ICD-10-CM | POA: Diagnosis not present

## 2023-11-03 MED ORDER — METOPROLOL TARTRATE 100 MG PO TABS: 100.0000 mg | ORAL_TABLET | ORAL | 0 refills | Status: DC

## 2023-11-03 NOTE — Patient Instructions (Addendum)
 Medication Instructions:   - TAKE metoprolol tartrate ( LOPRESSOR) 100 MG 2 HOURS before Coronary CTA.    *If you need a refill on your cardiac medications before your next appointment, please call your pharmacy*   Lab Work: Basic Metabolic Panel    If you have labs (blood work) drawn today and your tests are completely normal, you will receive your results only by: MyChart Message (if you have MyChart) OR A paper copy in the mail If you have any lab test that is abnormal or we need to change your treatment, we will call you to review the results.   Testing/Procedures:  Coronary CT at [facility]    Follow-Up: At [facility], you and your health needs are our priority.  As part of our continuing mission to provide you with exceptional heart care, we have created designated Provider Care Teams.  These Care Teams include your primary Cardiologist (physician) and Advanced Practice Providers (APPs -  Physician Assistants and Nurse Practitioners) who all work together to provide you with the care you need, when you need it.  We recommend signing up for the patient portal called "MyChart".  Sign up information is provided on this After Visit Summary.  MyChart is used to connect with patients for Virtual Visits (Telemedicine).  Patients are able to view lab/test results, encounter notes, upcoming appointments, etc.  Non-urgent messages can be sent to your provider as well.   To learn more about what you can do with MyChart, go to [email or web address].    Your next appointment:   Follow up pending results of cardiac testing.     Provider:   Lennie Odor, MD     Other Instructions     Your cardiac CT will be scheduled at one of the below locations:   [facility] [address] [place], [place] [address] [phone]  OR  [facility] [address] [place], [place] [address] [phone]  OR   [facility] [address] [place], [place] [address] [phone]  OR   [facility] [address] [place], [place] [address] [phone]  OR   [facility] [address]  [place], [place] [address] Opening November 24, 2023  If scheduled at [facility], please arrive at the [facility] and Children's Entrance (Entrance C2) of [facility] 30 minutes prior to test start time. You can use the FREE valet parking offered at entrance C (encouraged to control the heart rate for the test)  Proceed to the [facility] Radiology Department (first floor) to check-in and test prep.  All radiology patients and guests should use entrance C2 at [facility], accessed from [facility], even though the hospital's physical address listed is [address]    If scheduled at [facility] or [facility], please arrive 15 mins early for check-in and test prep.  There is spacious parking and easy access to the radiology department from the [facility] V[facility] entrance. Please enter here and check-in with the desk attendant.   If scheduled at [facility], please arrive 30 minutes early for check-in and test prep.  Please follow these instructions carefully (unless otherwise directed):  An IV will be required for this test and Nitroglycerin will be given.  Hold all erectile dysfunction medications at least 3 days (72 hrs) prior to test. (Ie  viagra, cialis, sildenafil, tadalafil, etc)   On the Night Before the Test: Be sure to Drink plenty of water. Do not consume any caffeinated/decaffeinated beverages or chocolate 12 hours prior to your test. Do not take any antihistamines 12 hours prior to your test.  On the Day of the Test: Drink plenty of water until 1 hour prior to the test. Do not eat any food 1 hour prior to test. You may take your  regular medications prior to the test.  Take metoprolol (Lopressor) two hours prior to test. HOLD Spironolactone the morning of the test. Patients who wear a continuous glucose monitor MUST remove the device prior to scanning. FEMALES- please wear underwire-free bra if available, avoid dresses & tight clothing      After the Test: Drink plenty of water. After receiving IV contrast, you may experience a mild flushed feeling. This is normal. On occasion, you may experience a mild rash up to 24 hours after the test. This is not dangerous. If this occurs, you can take Benadryl 25 mg, Zyrtec, Claritin, or Allegra and increase your fluid intake. (Patients taking Tikosyn should avoid Benadryl, and may take Zyrtec, Claritin, or Allegra) If you experience trouble breathing, this can be serious. If it is severe call 911 IMMEDIATELY. If it is mild, please call our office.  We will call to schedule your test 2-4 weeks out understanding that some insurance companies will need an authorization prior to the service being performed.   For more information and frequently asked questions, please visit our website : [email or web address]  For non-scheduling related questions, please contact the cardiac imaging nurse navigator should you have any questions/concerns: Cardiac Imaging Nurse Navigators Direct Office Dial: [phone]   For scheduling needs, including cancellations and rescheduling, please call [place], [phone].

## 2023-11-04 ENCOUNTER — Encounter: Payer: Self-pay | Admitting: Cardiovascular Disease

## 2023-11-04 LAB — BASIC METABOLIC PANEL WITH GFR
BUN/Creatinine Ratio: 12 (ref 9–23)
BUN: 9 mg/dL (ref 6–24)
CO2: 23 mmol/L (ref 20–29)
Calcium: 9.8 mg/dL (ref 8.7–10.2)
Chloride: 102 mmol/L (ref 96–106)
Creatinine, Ser: 0.78 mg/dL (ref 0.57–1.00)
Glucose: 98 mg/dL (ref 70–99)
Potassium: 4.1 mmol/L (ref 3.5–5.2)
Sodium: 140 mmol/L (ref 134–144)
eGFR: 92 mL/min/{1.73_m2} (ref >59)

## 2023-11-07 ENCOUNTER — Ambulatory Visit: Payer: Self-pay

## 2023-11-07 NOTE — Telephone Encounter (Signed)
  Chief Complaint: Chest pain Symptoms: right sided chest pain radiates to her side and into her back Frequency: CP started over the weekend and has gotten worse Pertinent Negatives: Patient denies SOB,  Disposition: [x] ED /[] Urgent Care (no appt availability in office) / [] Appointment(In office/virtual)/ []  [facility]/ [] Home Care/ [] Refused Recommended Disposition /[] [facility]/ []  Follow-up with PCP Additional Notes: patient calling with concerns for right sided chest pain and pressure that radiates to her side into her back. Patient states pain is a 6 out of 10 and is aching in nature. Patient endorses chest pain initial started last week but has gotten worse over the week. Patient states she has been achy and felt feverish. Per protocol, patient is recommended to the ED. Patient verbalized understanding and all questions answered. Patient to go to the ED.    Copied from CRM [id number]. Topic: Clinical - Red Word Triage >> Nov 07, 2023  4:36 PM Higinio Roger wrote: Red Word that prompted transfer to Nurse Triage: pressure and pain on right side of chest. Pain level is 6. Aching Reason for Disposition  [1] Chest pain lasts > 5 minutes AND [2] occurred in past 3 days (72 hours) (Exception: Feels exactly the same as previously diagnosed heartburn and has accompanying sour taste in mouth.)  Answer Assessment - Initial Assessment Questions 1. LOCATION: "Where does it hurt?"       Right chest pain 2. RADIATION: "Does the pain go anywhere else?" (e.g., into neck, jaw, arms, back)     Radiates to the side and into her back 3. ONSET: "When did the chest pain begin?" (Minutes, hours or days)      Started last weekend 4. PATTERN: "Does the pain come and go, or has it been constant since it started?"  "Does it get worse with exertion?"      Constant, unsure if pain increases with exertion 5. DURATION: "How long does it last" (e.g., seconds, minutes, hours)     CP has been going on  since last Weekend 6. SEVERITY: "How bad is the pain?"  (e.g., Scale 1-10; mild, moderate, or severe)    - MILD (1-3): doesn't interfere with normal activities     - MODERATE (4-7): interferes with normal activities or awakens from sleep    - SEVERE (8-10): excruciating pain, unable to do any normal activities       5-6 out of 10 7. CARDIAC RISK FACTORS: "Do you have any history of heart problems or risk factors for heart disease?" (e.g., angina, prior heart attack; diabetes, high blood pressure, high cholesterol, smoker, or strong family history of heart disease)     Family hx of cardiac problems 8. PULMONARY RISK FACTORS: "Do you have any history of lung disease?"  (e.g., blood clots in lung, asthma, emphysema, birth control pills)     asthma 9. CAUSE: "What do you think is causing the chest pain?"     unsure 10. OTHER SYMPTOMS: "Do you have any other symptoms?" (e.g., dizziness, nausea, vomiting, sweating, fever, difficulty breathing, cough)       Cough, felt feverish  Protocols used: Chest Pain-A-AH

## 2023-11-12 ENCOUNTER — Encounter: Payer: Self-pay | Admitting: Family Medicine

## 2023-11-12 ENCOUNTER — Ambulatory Visit: Admitting: Family Medicine

## 2023-11-12 ENCOUNTER — Ambulatory Visit (INDEPENDENT_AMBULATORY_CARE_PROVIDER_SITE_OTHER)

## 2023-11-12 VITALS — BP 124/86 | HR 83 | Temp 98.7°F | Ht 66.0 in | Wt 327.8 lb

## 2023-11-12 DIAGNOSIS — M5440 Lumbago with sciatica, unspecified side: Secondary | ICD-10-CM

## 2023-11-12 DIAGNOSIS — M25552 Pain in left hip: Secondary | ICD-10-CM

## 2023-11-12 DIAGNOSIS — M1612 Unilateral primary osteoarthritis, left hip: Secondary | ICD-10-CM | POA: Diagnosis not present

## 2023-11-12 DIAGNOSIS — M47816 Spondylosis without myelopathy or radiculopathy, lumbar region: Secondary | ICD-10-CM | POA: Diagnosis not present

## 2023-11-12 DIAGNOSIS — N200 Calculus of kidney: Secondary | ICD-10-CM | POA: Diagnosis not present

## 2023-11-12 MED ORDER — METHYLPREDNISOLONE ACETATE 80 MG/ML IJ SUSP
80.0000 mg | Freq: Once | INTRAMUSCULAR | Status: DC
Start: 2023-11-12 — End: 2023-11-12

## 2023-11-12 MED ORDER — KETOROLAC TROMETHAMINE 60 MG/2ML IM SOLN: 60.0000 mg | Freq: Once | INTRAMUSCULAR | Status: DC

## 2023-11-12 MED ORDER — KETOROLAC TROMETHAMINE 60 MG/2ML IM SOLN
60.0000 mg | Freq: Once | INTRAMUSCULAR | Status: AC
  Administered 2023-11-12: 60 mg via INTRAMUSCULAR

## 2023-11-12 MED ORDER — OXYCODONE-ACETAMINOPHEN 5-325 MG PO TABS: 1.0000 | ORAL_TABLET | Freq: Four times a day (QID) | ORAL | 0 refills | Status: DC | PRN

## 2023-11-12 MED ORDER — METHOCARBAMOL 500 MG PO TABS: 500.0000 mg | ORAL_TABLET | Freq: Three times a day (TID) | ORAL | 0 refills | Status: DC | PRN

## 2023-11-12 MED ORDER — METHYLPREDNISOLONE ACETATE 80 MG/ML IJ SUSP
80.0000 mg | Freq: Once | INTRAMUSCULAR | Status: AC
  Administered 2023-11-12: 80 mg via INTRAMUSCULAR

## 2023-11-12 MED ORDER — OXYCODONE-ACETAMINOPHEN 5-325 MG PO TABS: 1.0000 | ORAL_TABLET | Freq: Four times a day (QID) | ORAL | 0 refills | Status: AC | PRN

## 2023-11-12 NOTE — Patient Instructions (Addendum)
 May take percocet one tablet every 6 hours as needed for mod-severe pain. This medication can make you sleepy, please do not drive or operate machinery until you know how this medication affects you  May take methocarbamol, one tablet every 8 hours as needed for muscle spasms. This medication can also be sedating  You have received a steroid injection in the office today.  You have received an injection of Toradol in office today for pain.  Do not take any NSAIDs including aspirin, Aleve, ibuprofen, Celebrex, meloxicam within the next 6 hours after your injection.  We are getting an xray today. We will be in contact with any abnormal results that require further attention.  Will refer to sports medicine or orthopedics as needed after xray results are received

## 2023-11-12 NOTE — Progress Notes (Signed)
 Acute Office Visit  Subjective:     Patient ID: Alexa West, female    DOB: [birth year], 53 y.o.   MRN: [id number]  Chief Complaint  Patient presents with   Hip Pain    Left side, worsening, struggles bathing and getting dressed occasionally. Worse when going from sitting to standing    HPI Patient is in today for evaluation of left hip pain for about the last month.  Has been taking motrin with little relief.  Pain makes it difficult for her to bend her left knee, radiates down the left leg. Describes pain as a sharp, deep, ache States pain has been so severe at times that her husband has had to help her bathe and get dressed.  Has hx sciatica, reports that this feels different, has not been this severe in the past.  Denies known injury, previous symptoms, erythema, bruising, numbness, tingling, loss of strength, other symptoms.  Denies other concerns today. Medical hx as outlined below.  ROS Per HPI      Objective:    BP 124/86 (BP Location: Left Arm, Patient Position: Sitting)   Pulse 83   Temp 98.7 F (37.1 C) (Temporal)   Ht 5\' 6"  (1.676 m)   Wt (!) 327 lb 12.8 oz (148.7 kg)   LMP 02/25/2023   SpO2 96%   BMI 52.91 kg/m    Physical Exam Vitals and nursing note reviewed.  Constitutional:      General: She is not in acute distress.    Appearance: Normal appearance. She is obese.     Comments: Appears uncomfortable  HENT:     Head: Normocephalic and atraumatic.     Right Ear: External ear normal.     Left Ear: External ear normal.     Nose: Nose normal.  Eyes:     Extraocular Movements: Extraocular movements intact.  Cardiovascular:     Rate and Rhythm: Normal rate and regular rhythm.     Pulses: Normal pulses.  Pulmonary:     Effort: Pulmonary effort is normal.  Musculoskeletal:     Cervical back: Normal range of motion.     Comments: Limited ROM with walking, moving the L leg at all reproduces severe discomfort, cannot tolerate further exam   Lymphadenopathy:     Cervical: No cervical adenopathy.  Neurological:     General: No focal deficit present.     Mental Status: She is alert and oriented to person, place, and time.  Psychiatric:        Mood and Affect: Mood normal.        Thought Content: Thought content normal.     No results found for any visits on 11/12/23.      Assessment & Plan:   Left hip pain -     DG HIP UNILAT W OR W/O PELVIS 2-3 VIEWS LEFT; Future -     DG Lumbar Spine 2-3 Views; Future -     Methocarbamol; Take 1 tablet (500 mg total) by mouth every 8 (eight) hours as needed for muscle spasms.  Dispense: 30 tablet; Refill: 0 -     oxyCODONE-Acetaminophen; Take 1 tablet by mouth every 6 (six) hours as needed for up to 5 days for moderate pain (pain score 4-6) or severe pain (pain score 7-10).  Dispense: 21 tablet; Refill: 0 -     Ketorolac Tromethamine -     methylPREDNISolone Acetate  Acute left-sided low back pain with sciatica, sciatica laterality unspecified -  DG Lumbar Spine 2-3 Views; Future -     Methocarbamol; Take 1 tablet (500 mg total) by mouth every 8 (eight) hours as needed for muscle spasms.  Dispense: 30 tablet; Refill: 0 -     oxyCODONE-Acetaminophen; Take 1 tablet by mouth every 6 (six) hours as needed for up to 5 days for moderate pain (pain score 4-6) or severe pain (pain score 7-10).  Dispense: 21 tablet; Refill: 0 -     Ketorolac Tromethamine -     methylPREDNISolone Acetate    Meds ordered this encounter  Medications   DISCONTD: oxyCODONE-acetaminophen (PERCOCET) 5-325 MG tablet    Sig: Take 1 tablet by mouth every 6 (six) hours as needed for up to 5 days for moderate pain (pain score 4-6) or severe pain (pain score 7-10).    Dispense:  21 tablet    Refill:  0   methocarbamol (ROBAXIN) 500 MG tablet    Sig: Take 1 tablet (500 mg total) by mouth every 8 (eight) hours as needed for muscle spasms.    Dispense:  30 tablet    Refill:  0   oxyCODONE-acetaminophen (PERCOCET)  5-325 MG tablet    Sig: Take 1 tablet by mouth every 6 (six) hours as needed for up to 5 days for moderate pain (pain score 4-6) or severe pain (pain score 7-10).    Dispense:  21 tablet    Refill:  0   ketorolac (TORADOL) injection 60 mg   methylPREDNISolone acetate (DEPO-MEDROL) injection 80 mg   DISCONTD: methylPREDNISolone acetate (DEPO-MEDROL) injection 80 mg   DISCONTD: ketorolac (TORADOL) injection 60 mg    Return if symptoms worsen or fail to improve.  [name], FNP

## 2023-11-14 ENCOUNTER — Other Ambulatory Visit: Payer: Self-pay

## 2023-11-14 ENCOUNTER — Emergency Department (HOSPITAL_COMMUNITY)

## 2023-11-14 ENCOUNTER — Encounter (HOSPITAL_COMMUNITY): Payer: Self-pay | Admitting: *Deleted

## 2023-11-14 ENCOUNTER — Emergency Department (HOSPITAL_COMMUNITY)
Admission: EM | Admit: 2023-11-14 | Discharge: 2023-11-14 | Disposition: A | Discharge status: Home / Self Care | Attending: Emergency Medicine | Admitting: Emergency Medicine

## 2023-11-14 DIAGNOSIS — R079 Chest pain, unspecified: Secondary | ICD-10-CM | POA: Insufficient documentation

## 2023-11-14 DIAGNOSIS — R0789 Other chest pain: Secondary | ICD-10-CM

## 2023-11-14 LAB — CBC
HCT: 39.5 % (ref 36.0–46.0)
Hemoglobin: 12.4 g/dL (ref 12.0–15.0)
MCH: 27.7 pg (ref 26.0–34.0)
MCHC: 31.4 g/dL (ref 30.0–36.0)
MCV: 88.2 fL (ref 80.0–100.0)
Platelets: 230 10*3/uL (ref 150–400)
RBC: 4.48 MIL/uL (ref 3.87–5.11)
RDW: 13.4 % (ref 11.5–15.5)
WBC: 7.3 10*3/uL (ref 4.0–10.5)
nRBC: 0 % (ref 0.0–0.2)

## 2023-11-14 LAB — BASIC METABOLIC PANEL WITH GFR
Anion gap: 12 (ref 5–15)
BUN: 6 mg/dL (ref 6–20)
CO2: 22 mmol/L (ref 22–32)
Calcium: 9 mg/dL (ref 8.9–10.3)
Chloride: 106 mmol/L (ref 98–111)
Creatinine, Ser: 0.76 mg/dL (ref 0.44–1.00)
GFR, Estimated: >60 mL/min (ref >60)
Glucose, Bld: 90 mg/dL (ref 70–99)
Potassium: 3.7 mmol/L (ref 3.5–5.1)
Sodium: 140 mmol/L (ref 135–145)

## 2023-11-14 LAB — D-DIMER, QUANTITATIVE: D-Dimer, Quant: 0.5 ug{FEU}/mL (ref 0.00–0.50)

## 2023-11-14 LAB — TROPONIN I (HIGH SENSITIVITY)
Troponin I (High Sensitivity): 4 ng/L (ref <18)
Troponin I (High Sensitivity): 3 ng/L (ref <18)

## 2023-11-14 NOTE — ED Notes (Signed)
 Patient dc'ed home at this time no acute distress noted vss no complaints of CP/SOB reviewed discharge instructions. Encouraged to return to the ER if s/s persist or become bothersome. Verbalized understanding. Patient and husband assisted to lobby.

## 2023-11-14 NOTE — ED Notes (Signed)
 Re taking bp to make sure pt is laying down still

## 2023-11-14 NOTE — ED Triage Notes (Signed)
 Pt is here for chest pressure x several months.  She has been seen by PCP and cardiology for this but last pm she got this again and it was more intense than in the past and associated with a feeling of sob.  Pt feels this in central to right right chest with radiation to right shoulder and back.

## 2023-11-14 NOTE — ED Provider Triage Note (Addendum)
 Emergency Medicine Provider Triage Evaluation Note  Alexa West , a 53 y.o. female  was evaluated in triage.  Pt complains of chest pain.  Review of Systems  Positive: Pressure type pain this morning, mild SOB Negative: Cough fever  Physical Exam  BP 139/80 (BP Location: Right Arm)   Pulse 65   Temp 98.5 F (36.9 C)   Resp 18   LMP 02/25/2023   SpO2 97%  Gen:   Awake, no distress   Resp:  Normal effort  MSK:   Moves extremities without difficulty  Other:  RRR, lungs clear  Medical Decision Making  Medically screening exam initiated at 1:51 PM.  Appropriate orders placed.  Alexa West was informed that the remainder of the evaluation will be completed by another provider, this initial triage assessment does not replace that evaluation, and the importance of remaining in the ED until their evaluation is complete.  Patient with history of chest pressure, but "different" this morning. Seen by cardiology for hypotension 4/7 and is being scheduled for coronary CT. No modifying factors, no history of ischemia, clots. Also reports pain and swelling of the left leg, new left hip pain and pain behind left knee.   Wells Criteria for PE -0.0   Mandy Second, PA-C 11/14/23 1353    Mandy Second, PA-C 11/14/23 1356

## 2023-11-14 NOTE — Discharge Instructions (Signed)
 Please follow-up with your cardiologist, your testing has not shown any signs of heart attack or blood clot, everything has been normal.  Please start taking a baby aspirin every day, call the cardiology office first thing in the morning to make a close follow-up and to make sure they are scheduling your CT coronary study.  ER for severe worsening symptoms

## 2023-11-14 NOTE — ED Provider Notes (Signed)
 [place] [facility]PARTMENT AT [facility] Provider Note   CSN: [id number] Arrival date & time: 11/14/23  1329     History  Chief Complaint  Patient presents with   Chest Pain    Alexa West is a 53 y.o. female.   Chest Pain  This patient is a 53 year old female, she presents to the hospital today with a complaint of chest pain, this patient states that the only 2 medications that she takes are sertraline  and occasionally spironolactone  for PCOS but she has never been diagnosed with hypertension, hypercholesterolemia, diabetes nor does she use tobacco, alcohol.  She has a prior history of a cholecystectomy but is never had any other abdominal surgery and has never had any cardiac abnormalities.  She does not have a family history of heart disease other than a grandfather who had cardiac surgery in his 87s, her parents never had heart disease though her biological father died when she was very young from a gunshot wound.  The patient has had chest pain to some degree since September, it is not exertional, it is intermittent, it often has some involvement of her left shoulder or her neck or her right shoulder or her back, she has seen a cardiologist, recently seen them on April 7, Dr. Vernice at that time had recommended that she needed a coronary CT although that has not yet been scheduled.  Last night her pain became more intense, this occurred at rest, she had a feeling of mild shortness of breath with it, she also noticed that her legs have been swelling especially on the left side.  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  She states her ankle usually swells but not her whole leg, she felt like more of her leg was swollen yesterday.  She has no other risk factors for pulmonary embolism, she does not use any birth control, she does not smoke, she has not had any trauma immobilization surgery or any other risk factors.  She does not feel  short of breath at this time, she has minimal chest discomfort at this time    Home Medications Prior to Admission medications   Medication Sig Start Date End Date Taking? Authorizing Provider  albuterol  (VENTOLIN  HFA) 108 (90 Base) MCG/ACT inhaler Inhale 2 puffs into the lungs every 6 (six) hours as needed for wheezing or shortness of breath. 05/26/23   Jordan, Betty G, MD  AUVI-Q  0.3 MG/0.3ML SOAJ injection  01/27/18   [provider]  budesonide -formoterol  (SYMBICORT ) 160-4.5 MCG/ACT inhaler INHALE 2 PUFFS INTO THE LUNGS TWICE DAILY 08/14/22   Kara Dorn NOVAK, MD  clobetasol  cream (TEMOVATE ) 0.05 % Apply 1 Application topically 2 (two) times daily. 10/03/23   Alvia Corean CROME, FNP  diclofenac  Sodium (VOLTAREN  ARTHRITIS PAIN) 1 % GEL Apply 2 g topically 4 (four) times daily. 08/25/23   Alvia Corean CROME, FNP  meloxicam  (MOBIC ) 7.5 MG tablet Take 1 tablet (7.5 mg total) by mouth daily. 07/29/23   McCaughan, Dia D, DPM  methocarbamol  (ROBAXIN ) 500 MG tablet Take 1 tablet (500 mg total) by mouth every 8 (eight) hours as needed for muscle spasms. 11/12/23   Alvia Corean CROME, FNP  metoprolol  tartrate (LOPRESSOR ) 100 MG tablet Take 1 tablet (100 mg total) by mouth as directed. Take TWO HOURS before your CT test 11/03/23 02/01/24  Barbaraann Darryle Ned, MD  ondansetron  (ZOFRAN ) 4 MG tablet Take 1 tablet (4 mg total) by mouth every 8 (  eight) hours as needed for nausea or vomiting. 08/05/23   Cleotilde Ronal RAMAN, MD  oxyCODONE -acetaminophen  (PERCOCET) 5-325 MG tablet Take 1 tablet by mouth every 6 (six) hours as needed for up to 5 days for moderate pain (pain score 4-6) or severe pain (pain score 7-10). 11/12/23 11/17/23  Alvia Corean CROME, FNP  pantoprazole  (PROTONIX ) 40 MG tablet Take 1 tablet (40 mg total) by mouth daily. 07/28/23   Cleotilde Ronal RAMAN, MD  promethazine  (PHENERGAN ) 25 MG suppository Place 1 suppository (25 mg total) rectally every 6 (six) hours as needed for nausea or vomiting.  04/09/23   Mesner, Selinda, MD  promethazine  (PHENERGAN ) 25 MG tablet Take 1 tablet (25 mg total) by mouth every 6 (six) hours as needed for nausea or vomiting. 04/09/23   Mesner, Selinda, MD  rizatriptan  (MAXALT ) 10 MG tablet TAKE 1 TABLET BY MOUTH AS NEEDED FOR MIGRAINE. MAY REPEAT IN 2 HOURS IF NEEDED 09/10/23   Jordan, Betty G, MD  sertraline  (ZOLOFT ) 100 MG tablet Take 1.5 tablets (150 mg total) by mouth daily. 07/28/23   Cleotilde Ronal RAMAN, MD  Spacer/Aero-Holding Chambers (AEROCHAMBER PLUS) inhaler Use as instructed to use with inahaler. 09/17/19   Jordan, Betty G, MD  spironolactone  (ALDACTONE ) 100 MG tablet TAKE 1 TABLET (100 MG TOTAL) BY MOUTH 2 (TWO) TIMES DAILY. 07/28/23 07/27/24  Cleotilde Ronal RAMAN, MD  topiramate  (TOPAMAX ) 25 MG tablet TAKE 1 TABLET(25 MG) BY MOUTH AT BEDTIME 12/14/21   Jordan, Betty G, MD      Allergies    Cashew nut oil, Milk-related compounds, Wheat, Cephalosporins, and Penicillins    Review of Systems   Review of Systems  Cardiovascular:  Positive for chest pain.  All other systems reviewed and are negative.   Physical Exam Updated Vital Signs BP (!) 94/55   Pulse 65   Temp 98.7 F (37.1 C)   Resp 18   LMP 02/25/2023   SpO2 100%  Physical Exam Vitals and nursing note reviewed.  Constitutional:      General: She is not in acute distress.    Appearance: She is well-developed.  HENT:     Head: Normocephalic and atraumatic.     Mouth/Throat:     Pharynx: No oropharyngeal exudate.  Eyes:     General: No scleral icterus.       Right eye: No discharge.        Left eye: No discharge.     Conjunctiva/sclera: Conjunctivae normal.     Pupils: Pupils are equal, round, and reactive to light.  Neck:     Thyroid : No thyromegaly.     Vascular: No JVD.  Cardiovascular:     Rate and Rhythm: Normal rate and regular rhythm.     Heart sounds: Normal heart sounds. No murmur heard.    No friction rub. No gallop.     Comments: Heart rate is 60 Pulmonary:     Effort:  Pulmonary effort is normal. No respiratory distress.     Breath sounds: Normal breath sounds. No wheezing or rales.  Abdominal:     General: Bowel sounds are normal. There is no distension.     Palpations: Abdomen is soft. There is no mass.     Tenderness: There is no abdominal tenderness.  Musculoskeletal:        General: No tenderness. Normal range of motion.     Cervical back: Normal range of motion and neck supple.     Right lower leg: No edema.  Left lower leg: Edema present.     Comments: Mild edema of the left lower extremity below the knee compared to the right  Lymphadenopathy:     Cervical: No cervical adenopathy.  Skin:    General: Skin is warm and dry.     Findings: No erythema or rash.  Neurological:     General: No focal deficit present.     Mental Status: She is alert.     Coordination: Coordination normal.  Psychiatric:        Behavior: Behavior normal.     ED Results / Procedures / Treatments   Labs (all labs ordered are listed, but only abnormal results are displayed) Labs Reviewed  BASIC METABOLIC PANEL WITH GFR  CBC  D-DIMER, QUANTITATIVE  TROPONIN I (HIGH SENSITIVITY)  TROPONIN I (HIGH SENSITIVITY)    EKG EKG Interpretation Date/Time:  Friday November 14 2023 13:35:36 EDT Ventricular Rate:  69 PR Interval:  176 QRS Duration:  90 QT Interval:  410 QTC Calculation: 439 R Axis:   37  Text Interpretation: Normal sinus rhythm Low voltage QRS Borderline ECG When compared with ECG of 03-Nov-2023 13:39, PREVIOUS ECG IS PRESENT Confirmed by Cleotilde Rogue ([id number]) on 11/14/2023 4:33:21 PM  Radiology DG Chest 2 View Result Date: 11/14/2023 CLINICAL DATA:  CP EXAM: CHEST - 2 VIEW COMPARISON:  March 27, 2023 FINDINGS: Perihilar interstitial opacities throughout both lungs. No focal airspace consolidation, pleural effusion, or pneumothorax. No cardiomegaly. Tortuous aorta with aortic atherosclerosis. No acute fracture or destructive lesion. Thoracic DISH.  IMPRESSION: Perihilar interstitial opacities throughout both lungs, which may reflect changes of bronchitis or viral pneumonia. Electronically Signed   By: Rogelia Myers M.D.   On: 11/14/2023 17:03    Procedures Procedures    Medications Ordered in ED Medications - No data to display  ED Course/ Medical Decision Making/ A&P                                 Medical Decision Making Amount and/or Complexity of Data Reviewed Labs: ordered. Radiology: ordered.    This patient presents to the ED for concern of chest pain with shortness of breath, this involves an extensive number of treatment options, and is a complaint that carries with it a high risk of complications and morbidity.  The differential diagnosis includes chronic chest pain, anxiety, seems unlikely to be pulmonary embolism given her heart rate of 60 and oxygen of 100% however given the swelling in her leg and the worsening chest pain will get a D-dimer.  Will also need labs to rule out myocardial infarction and a chest x-ray   Co morbidities that complicate the patient evaluation  Obesity   Additional history obtained:  Additional history obtained from cardiology notes External records from outside source obtained and reviewed including cardiology notes   Lab Tests:  I Ordered, and personally interpreted labs.  The pertinent results include: Troponin is negative, repeat troponin is negative, there is no delta change, D-dimer is negative   Imaging Studies ordered:  I ordered imaging studies including chest x-ray I independently visualized and interpreted imaging which showed no acute findings I agree with the radiologist interpretation   Cardiac Monitoring: / EKG:  The patient was maintained on a cardiac monitor.  I personally viewed and interpreted the cardiac monitored which showed an underlying rhythm of: Normal sinus rhythm   Problem List / ED Course / Critical interventions / Medication  management  The patient presents with chest pain without a definite source, this has been going on since September to some degree and there has been no findings of concern in her prior workup or today's workup.  The patient was encouraged to use a baby aspirin daily and to follow-up with her cardiologist.  She does not have any respiratory symptoms including coughing or fever, she is not short of breath, I do not think that the x-ray findings of perihilar interstitial opacities are of any significant concern for her at this time.  She can definitely have this followed up in the outpatient setting.   I have discussed with the patient at the bedside the results, and the meaning of these results.  They have had opportunity to ask questions,  expressed their understanding to the need for follow-up with primary care physician     Social Determinants of Health:  Non-smoker   Test / Admission - Considered:  Admission but workup has been unremarkable         Final Clinical Impression(s) / ED Diagnoses Final diagnoses:  Chest pain, mid sternal    Rx / DC Orders ED Discharge Orders     None         Cleotilde Rogue, MD 11/14/23 1924

## 2023-11-18 ENCOUNTER — Telehealth: Payer: Self-pay | Admitting: Cardiovascular Disease

## 2023-11-18 DIAGNOSIS — R079 Chest pain, unspecified: Secondary | ICD-10-CM

## 2023-11-18 NOTE — Telephone Encounter (Signed)
   Orders placed for CTA    Attempted to call patient x2 no answer left detailed message informing patient order placed.   Mychart message also sent

## 2023-11-18 NOTE — Telephone Encounter (Signed)
 Patient called to follow-up on Coronary CT test Dr. Rolm Clos was to order for her.

## 2023-11-23 DIAGNOSIS — G4733 Obstructive sleep apnea (adult) (pediatric): Secondary | ICD-10-CM | POA: Diagnosis not present

## 2023-11-26 ENCOUNTER — Ambulatory Visit: Admitting: Internal Medicine

## 2023-11-27 ENCOUNTER — Encounter (HOSPITAL_COMMUNITY): Payer: Self-pay

## 2023-11-28 ENCOUNTER — Encounter: Payer: Self-pay | Admitting: Family Medicine

## 2023-12-01 ENCOUNTER — Ambulatory Visit (HOSPITAL_COMMUNITY)
Admission: RE | Admit: 2023-12-01 | Discharge: 2023-12-01 | Disposition: A | Discharge status: Home / Self Care | Source: Ambulatory Visit | Attending: Cardiovascular Disease | Admitting: Cardiovascular Disease

## 2023-12-01 DIAGNOSIS — R079 Chest pain, unspecified: Secondary | ICD-10-CM | POA: Insufficient documentation

## 2023-12-01 DIAGNOSIS — I2510 Atherosclerotic heart disease of native coronary artery without angina pectoris: Secondary | ICD-10-CM | POA: Diagnosis not present

## 2023-12-01 MED ORDER — NITROGLYCERIN 0.4 MG SL SUBL
SUBLINGUAL_TABLET | SUBLINGUAL | Status: AC
  Filled 2023-12-01: qty 2

## 2023-12-01 MED ORDER — NITROGLYCERIN 0.4 MG SL SUBL
0.8000 mg | SUBLINGUAL_TABLET | Freq: Once | SUBLINGUAL | Status: AC
Start: 2023-12-01 — End: 2023-12-01
  Administered 2023-12-01: 0.8 mg via SUBLINGUAL

## 2023-12-01 MED ORDER — IOHEXOL 350 MG/ML SOLN
100.0000 mL | Freq: Once | INTRAVENOUS | Status: AC | PRN
  Administered 2023-12-01: 100 mL via INTRAVENOUS

## 2023-12-02 ENCOUNTER — Encounter: Payer: Self-pay | Admitting: Cardiovascular Disease

## 2023-12-09 ENCOUNTER — Encounter: Payer: Self-pay | Admitting: Allergy & Immunology

## 2023-12-09 ENCOUNTER — Ambulatory Visit: Admitting: Family Medicine

## 2023-12-09 ENCOUNTER — Other Ambulatory Visit: Payer: Self-pay

## 2023-12-09 ENCOUNTER — Ambulatory Visit (INDEPENDENT_AMBULATORY_CARE_PROVIDER_SITE_OTHER): Admitting: Allergy & Immunology

## 2023-12-09 VITALS — BP 124/76 | HR 74 | Temp 98.6°F | Resp 18 | Ht 66.14 in | Wt 324.8 lb

## 2023-12-09 DIAGNOSIS — J4520 Mild intermittent asthma, uncomplicated: Secondary | ICD-10-CM | POA: Diagnosis not present

## 2023-12-09 DIAGNOSIS — T7805XD Anaphylactic reaction due to tree nuts and seeds, subsequent encounter: Secondary | ICD-10-CM

## 2023-12-09 DIAGNOSIS — L2089 Other atopic dermatitis: Secondary | ICD-10-CM | POA: Diagnosis not present

## 2023-12-09 DIAGNOSIS — J310 Chronic rhinitis: Secondary | ICD-10-CM | POA: Diagnosis not present

## 2023-12-09 MED ORDER — TACROLIMUS 0.1 % EX OINT: TOPICAL_OINTMENT | Freq: Two times a day (BID) | CUTANEOUS | 3 refills | Status: AC

## 2023-12-09 NOTE — Progress Notes (Signed)
 NEW PATIENT  Date of Service/Encounter:  12/09/23  Consult requested by: [name], FNP   Assessment:   Flexural atopic dermatitis - failed hydrocortisone, triamcinolone , clobetasol   Chronic rhinitis - planning for skin testing at the next visit  Abdominal pain with whole peanuts (but tolerates peanut butter without a problem)  Intermittent asthma, uncomplicated - rare albuterol  use  Plan/Recommendations:   1. Flexural atopic dermatitis - poorly controlled  - Continue with moisturizing with the Cerve as often as you can remember.  - Continue with clobetasol  twice daily as needed (this is a STRONG steroid, so do not use on the face and only use for 1-2 weeks).  - Start tacrolimus ointment to use twice daily as needed. - Information on Ebglyss provided today (this is nice compared to Dupixent because it can space to every 4 weeks).   2. Anaphylaxis due to tree nut - We will do testing to the tree nut panel. - We can do select foods as well, but remember that there is a high false positive rate with food allergy testing. - Food testing sheet provided.   3. Chronic rhinitis - Because of insurance stipulations, we cannot do skin testing on the same day as your first visit. - We are all working to fight this, but for now we need to do two separate visits.  - We will know more after we do testing at the next visit.  - The skin testing visit can be squeezed in at your convenience.  - Then we can make a more full plan to address all of your symptoms. - Be sure to stop your antihistamines for 3 days before this appointment.   4. Intermittent asthma, uncomplicated  - Lung testing looks slightly abnormal today and it did not improve with the albuterol  treatment. - We are going to start you on AirSupra (contains a long-acting albuterol  combined with an inhaled steroid). - The inhaled steroid helps your lungs to use it more efficiently.  - You can use two puffs every 4-6  hours as needed (just like you do with albuterol ). - There is a also a $0 copay card.   5. Return in about 1 week (around 12/16/2023) for SKIN TESTING (1-55 + SELECT FOODS). You can have the follow up appointment with Dr. Idolina Maker or a Nurse Practicioner (our Nurse Practitioners are excellent and always have Physician oversight!).     This note in its entirety was forwarded to the Provider who requested this consultation.  Subjective:   Alexa West is a 53 y.o. female presenting today for evaluation of  Chief Complaint  Patient presents with   Allergies   Rash    Previous dr. Beulah Brunt that she might be having some food allergies    Asthma    Alexa West has a history of the following: Patient Active Problem List   Diagnosis Date Noted   Other atopic dermatitis 10/03/2023   Attention deficit 10/03/2023   Moderate obstructive sleep apnea 08/15/2023   Excessive daytime sleepiness 06/09/2023   Loud snoring 06/09/2023   Atypical chest pain 06/09/2023   Recurrent major depressive disorder, in partial remission (HCC) 05/24/2022   Hyperlipidemia 05/03/2021   Generalized osteoarthritis of multiple sites 05/17/2020   Fatigue 05/17/2020   Chronic pain disorder 05/17/2020   Primary osteoarthritis of left hip 03/03/2020   Right knee pain 10/27/2018   Neck pain 05/13/2018   Morbid obesity with BMI of 50.0-59.9, adult (HCC) 05/13/2018   PCOS (polycystic ovarian  syndrome) 12/05/2017   History of IBS 12/05/2017   S/P cholecystectomy 12/05/2017   Gastric ulcer with hemorrhage 12/05/2017   Eosinophilic esophagitis 12/05/2017   Obesity, Class III, BMI 40-49.9 (morbid obesity) 10/13/2017   Migraine headache without aura 10/13/2017   GI bleed 08/20/2016   Transaminitis 08/20/2016   Prediabetes 08/20/2016   Lumbar pain 05/24/2014   Impaired fasting glucose 09/18/2010   Depression 03/04/2008   Esophageal reflux 03/04/2008    History obtained from: chart review and  patient.  Discussed the use of AI scribe software for clinical note transcription with the patient and/or guardian, who gave verbal consent to proceed.  Alexa West was referred by [name], FNP.     Alexa West is a 53 y.o. female presenting for an evaluation of asthma, allergies, and eczema.  Asthma/Respiratory Symptom History: She has a history of asthma but does not actively manage it. She owns a Ventolin  inhaler but does not use it as often as she should, primarily experiencing symptoms at night. She replaces her inhaler every month or two. No current shortness of breath or asthma symptoms today.  Allergic Rhinitis Symptom History: She does have some allergic rhinitis symptoms. She has not been on antibiotics at all and is not on them routinely.   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 Cashews cause immediate abdominal cramping, which she discovered after consuming nut bars and experiencing severe pain. She tries to avoid tree nuts but occasionally consumes them. Peanuts cause some stomach irritation, but she can tolerate peanut butter. She previously maintained a food diary to track symptoms and identified bread as a potential trigger when consumed in excess. She lost 100 pounds on a restricted diet but has since regained weight after marriage.  Skin Symptom History: She has a history of eczema that began a couple of years ago, primarily affecting both arms. The eczema sometimes itches and sometimes does not. She uses Cerave moisturizing cream but dislikes its thickness. Clobetasol  was prescribed last month, which she applies when the eczema bothers her. She also has a history of using triamcinolone , but the eczema persisted. The eczema flares up occasionally, with some areas becoming inflamed and pale. It was particularly itchy in March, causing her to scratch relentlessly, leading to swelling in parts of  her arms.   She does not have a gallbladder, which complicates her ability to discern the cause of her abdominal symptoms.   Otherwise, there is no history of other atopic diseases, including drug allergies, stinging insect allergies, or contact dermatitis. There is no significant infectious history. Vaccinations are up to date.    Past Medical History: Patient Active Problem List   Diagnosis Date Noted   Other atopic dermatitis 10/03/2023   Attention deficit 10/03/2023   Moderate obstructive sleep apnea 08/15/2023   Excessive daytime sleepiness 06/09/2023   Loud snoring 06/09/2023   Atypical chest pain 06/09/2023   Recurrent major depressive disorder, in partial remission (HCC) 05/24/2022   Hyperlipidemia 05/03/2021   Generalized osteoarthritis of multiple sites 05/17/2020   Fatigue 05/17/2020   Chronic pain disorder 05/17/2020   Primary osteoarthritis of left hip 03/03/2020   Right knee pain 10/27/2018   Neck pain 05/13/2018   Morbid obesity with BMI of 50.0-59.9, adult (HCC) 05/13/2018   PCOS (polycystic ovarian syndrome) 12/05/2017   History of IBS 12/05/2017   S/P cholecystectomy 12/05/2017   Gastric ulcer with hemorrhage 12/05/2017   Eosinophilic esophagitis 12/05/2017  Obesity, Class III, BMI 40-49.9 (morbid obesity) 10/13/2017   Migraine headache without aura 10/13/2017   GI bleed 08/20/2016   Transaminitis 08/20/2016   Prediabetes 08/20/2016   Lumbar pain 05/24/2014   Impaired fasting glucose 09/18/2010   Depression 03/04/2008   Esophageal reflux 03/04/2008    Medication List:  Allergies as of 12/09/2023       Reactions   Cashew Nut Oil    Milk-related Compounds Diarrhea   Wheat    Cephalosporins Hives, Rash   Penicillins Hives, Rash        Medication List        Accurate as of Dec 09, 2023 12:05 PM. If you have any questions, ask your nurse or doctor.          AeroChamber Plus inhaler Use as instructed to use with inahaler.   albuterol  108  (90 Base) MCG/ACT inhaler Commonly known as: VENTOLIN  HFA Inhale 2 puffs into the lungs every 6 (six) hours as needed for wheezing or shortness of breath.   Auvi-Q 0.3 MG/0.3ML Soaj injection Generic drug: EPINEPHrine   budesonide -formoterol  160-4.5 MCG/ACT inhaler Commonly known as: SYMBICORT  INHALE 2 PUFFS INTO THE LUNGS TWICE DAILY   clobetasol  cream 0.05 % Commonly known as: TEMOVATE  Apply 1 Application topically 2 (two) times daily.   diclofenac  Sodium 1 % Gel Commonly known as: Voltaren  Arthritis Pain Apply 2 g topically 4 (four) times daily.   meloxicam  7.5 MG tablet Commonly known as: MOBIC  Take 1 tablet (7.5 mg total) by mouth daily.   methocarbamol  500 MG tablet Commonly known as: ROBAXIN  Take 1 tablet (500 mg total) by mouth every 8 (eight) hours as needed for muscle spasms.   metoprolol  tartrate 100 MG tablet Commonly known as: LOPRESSOR  Take 1 tablet (100 mg total) by mouth as directed. Take TWO HOURS before your CT test   ondansetron  4 MG tablet Commonly known as: Zofran  Take 1 tablet (4 mg total) by mouth every 8 (eight) hours as needed for nausea or vomiting.   pantoprazole  40 MG tablet Commonly known as: PROTONIX  Take 1 tablet (40 mg total) by mouth daily.   promethazine  25 MG tablet Commonly known as: PHENERGAN  Take 1 tablet (25 mg total) by mouth every 6 (six) hours as needed for nausea or vomiting.   promethazine  25 MG suppository Commonly known as: PHENERGAN  Place 1 suppository (25 mg total) rectally every 6 (six) hours as needed for nausea or vomiting.   rizatriptan  10 MG tablet Commonly known as: MAXALT  TAKE 1 TABLET BY MOUTH AS NEEDED FOR MIGRAINE. MAY REPEAT IN 2 HOURS IF NEEDED   sertraline  100 MG tablet Commonly known as: Zoloft  Take 1.5 tablets (150 mg total) by mouth daily.   spironolactone  100 MG tablet Commonly known as: ALDACTONE  TAKE 1 TABLET (100 MG TOTAL) BY MOUTH 2 (TWO) TIMES DAILY.   tacrolimus 0.1 % ointment Commonly  known as: PROTOPIC Apply topically 2 (two) times daily. OK to use head to toe. Started by: Rochester Chuck   topiramate  25 MG tablet Commonly known as: TOPAMAX  TAKE 1 TABLET(25 MG) BY MOUTH AT BEDTIME        Birth History: non-contributory  Developmental History: non-contributory  Past Surgical History: Past Surgical History:  Procedure Laterality Date   ADENOIDECTOMY     CERVICAL BIOPSY  W/ LOOP ELECTRODE EXCISION  1996   CHOLECYSTECTOMY  1997   HERNIA REPAIR  1976   TONSILLECTOMY     TONSILLECTOMY AND ADENOIDECTOMY  1996   urethral stretching  1980     Family History: Family History  Problem Relation Age of Onset   Arthritis Mother    Depression Mother    Hyperlipidemia Mother    Miscarriages / [place] Mother    Skin cancer Mother    Asthma Mother    Early death Father    Alcohol abuse Maternal Grandmother    Arthritis Maternal Grandmother    Depression Maternal Grandmother    Diabetes Maternal Grandmother    Hyperlipidemia Maternal Grandmother    Hypertension Maternal Grandmother    Kidney disease Maternal Grandmother    Arthritis Maternal Grandfather    Hypertension Maternal Grandfather    Kidney disease Maternal Grandfather    Heart attack Maternal Grandfather    Leukemia Maternal Grandfather    Bone cancer Maternal Grandfather    Alcohol abuse Paternal Grandmother    Cancer Paternal Grandmother    Alcohol abuse Paternal Grandfather      Social History: Orli lives at home with her family. She works at a [employer]. She has worked in several other pharmacy issues.   Review of systems otherwise negative other than that mentioned in the HPI.    Objective:   Blood pressure 124/76, pulse 74, temperature 98.6 F (37 C), temperature source Temporal, resp. rate 18, height 5' 6.14" (1.68 m), weight (!) 324 lb 12.8 oz (147.3 kg), last menstrual period 02/25/2023, SpO2 95%. Body mass index is 52.2 kg/m.     Physical Exam Vitals  reviewed.  Constitutional:      Appearance: She is well-developed.  HENT:     Head: Normocephalic and atraumatic.     Right Ear: Tympanic membrane, ear canal and external ear normal. No drainage, swelling or tenderness. Tympanic membrane is not injected, scarred, erythematous, retracted or bulging.     Left Ear: Tympanic membrane, ear canal and external ear normal. No drainage, swelling or tenderness. Tympanic membrane is not injected, scarred, erythematous, retracted or bulging.     Nose: No nasal deformity, septal deviation, mucosal edema or rhinorrhea.     Right Turbinates: Enlarged, swollen and pale.     Left Turbinates: Enlarged, swollen and pale.     Right Sinus: No maxillary sinus tenderness or frontal sinus tenderness.     Left Sinus: No maxillary sinus tenderness or frontal sinus tenderness.     Comments: No polyps noted.     Mouth/Throat:     Lips: Pink.     Mouth: Mucous membranes are moist. Mucous membranes are not pale and not dry.     Pharynx: Uvula midline.     Comments: Mild cobblestoning.  Eyes:     General: Lids are normal. Allergic shiner present.        Right eye: No discharge.        Left eye: No discharge.     Conjunctiva/sclera: Conjunctivae normal.     Right eye: Right conjunctiva is not injected. No chemosis.    Left eye: Left conjunctiva is not injected. No chemosis.    Pupils: Pupils are equal, round, and reactive to light.  Cardiovascular:     Rate and Rhythm: Normal rate and regular rhythm.     Heart sounds: Normal heart sounds.  Pulmonary:     Effort: Pulmonary effort is normal. No tachypnea, accessory muscle usage or respiratory distress.     Breath sounds: Normal breath sounds. No wheezing, rhonchi or rales.  Chest:     Chest wall: No tenderness.  Abdominal:     Tenderness: There is  no abdominal tenderness. There is no guarding or rebound.  Lymphadenopathy:     Head:     Right side of head: No submandibular, tonsillar or occipital adenopathy.      Left side of head: No submandibular, tonsillar or occipital adenopathy.     Cervical: No cervical adenopathy.  Skin:    Coloration: Skin is not pale.     Findings: No abrasion, erythema, petechiae or rash. Rash is not papular, urticarial or vesicular.  Neurological:     Mental Status: She is alert.  Psychiatric:        Behavior: Behavior is cooperative.      Diagnostic studies:    Spirometry: results abnormal (FEV1: 1.86/67%, FVC: 3.09/88%, FEV1/FVC: 61%).    Spirometry consistent with mild obstructive disease. Xopenex four puffs via MDI treatment given in clinic with no improvement.  Allergy Studies: none         Drexel Gentles, MD [facility] 

## 2023-12-09 NOTE — Patient Instructions (Addendum)
 1. Flexural atopic dermatitis - poorly controlled  - Continue with moisturizing with the Cerve as often as you can remember.  - Continue with clobetasol  twice daily as needed (this is a STRONG steroid, so do not use on the face and only use for 1-2 weeks).  - Start tacrolimus ointment to use twice daily as needed. - Information on Ebglyss provided today (this is nice compared to Dupixent because it can space to every 4 weeks).   2. Anaphylaxis due to tree nut - We will do testing to the tree nut panel. - We can do select foods as well, but remember that there is a high false positive rate with food allergy testing. - Food testing sheet provided.   3. Chronic rhinitis - Because of insurance stipulations, we cannot do skin testing on the same day as your first visit. - We are all working to fight this, but for now we need to do two separate visits.  - We will know more after we do testing at the next visit.  - The skin testing visit can be squeezed in at your convenience.  - Then we can make a more full plan to address all of your symptoms. - Be sure to stop your antihistamines for 3 days before this appointment.   4. Intermittent asthma, uncomplicated  - Lung testing looks slightly abnormal today and it did not improve with the albuterol  treatment. - We are going to start you on AirSupra (contains a long-acting albuterol  combined with an inhaled steroid). - The inhaled steroid helps your lungs to use it more efficiently.  - You can use two puffs every 4-6 hours as needed (just like you do with albuterol ). - There is a also a $0 copay card.   5. Return in about 1 week (around 12/16/2023) for SKIN TESTING (1-55 + SELECT FOODS). You can have the follow up appointment with Dr. Idolina Maker or a Nurse Practicioner (our Nurse Practitioners are excellent and always have Physician oversight!).    Please inform us  of any Emergency Department visits, hospitalizations, or changes in symptoms. Call us   before going to the ED for breathing or allergy symptoms since we might be able to fit you in for a sick visit. Feel free to contact us  anytime with any questions, problems, or concerns.  It was a pleasure to meet you today!  Websites that have reliable patient information: 1. American Academy of Asthma, Allergy, and Immunology: www.aaaai.org 2. Food Allergy Research and Education (FARE): foodallergy.org 3. Mothers of Asthmatics: http://www.asthmacommunitynetwork.org 4. American College of Allergy, Asthma, and Immunology: www.acaai.org      "Like" us  on Facebook and Instagram for our latest updates!      A healthy democracy works best when [employer] participate! Make sure you are registered to vote! If you have moved or changed any of your contact information, you will need to get this updated before voting! Scan the QR codes below to learn more!

## 2023-12-10 ENCOUNTER — Other Ambulatory Visit: Payer: Self-pay

## 2023-12-10 ENCOUNTER — Telehealth: Payer: Self-pay

## 2023-12-10 ENCOUNTER — Encounter: Payer: Self-pay | Admitting: Allergy & Immunology

## 2023-12-10 DIAGNOSIS — J4520 Mild intermittent asthma, uncomplicated: Secondary | ICD-10-CM

## 2023-12-10 MED ORDER — ALBUTEROL SULFATE HFA 108 (90 BASE) MCG/ACT IN AERS: 2.0000 | INHALATION_SPRAY | Freq: Four times a day (QID) | RESPIRATORY_TRACT | 0 refills | Status: DC | PRN

## 2023-12-10 MED ORDER — AIRSUPRA 90-80 MCG/ACT IN AERO: 2.0000 | INHALATION_SPRAY | Freq: Four times a day (QID) | RESPIRATORY_TRACT | 1 refills | Status: DC | PRN

## 2023-12-10 NOTE — Telephone Encounter (Signed)
 Approved. Effective Date: 12/10/2023 Authorization Expiration Date: 12/09/2024

## 2023-12-10 NOTE — Telephone Encounter (Signed)
*  Asthma/Allergy  Pharmacy Patient Advocate Encounter   Received notification from CoverMyMeds that prior authorization for Tacrolimus 0.1% ointment  is required/requested.   Insurance verification completed.   The patient is insured through [facility] .   Per test claim: PA required; PA submitted to above mentioned insurance via CoverMyMeds Key/confirmation #/EOC [id number] Status is pending

## 2023-12-25 ENCOUNTER — Encounter: Payer: Self-pay | Admitting: Family Medicine

## 2023-12-25 ENCOUNTER — Ambulatory Visit (INDEPENDENT_AMBULATORY_CARE_PROVIDER_SITE_OTHER): Admitting: Family Medicine

## 2023-12-25 VITALS — BP 132/84 | HR 74 | Temp 98.1°F | Resp 19

## 2023-12-25 DIAGNOSIS — T7800XD Anaphylactic reaction due to unspecified food, subsequent encounter: Secondary | ICD-10-CM | POA: Diagnosis not present

## 2023-12-25 DIAGNOSIS — Z91038 Other insect allergy status: Secondary | ICD-10-CM

## 2023-12-25 DIAGNOSIS — L508 Other urticaria: Secondary | ICD-10-CM | POA: Diagnosis not present

## 2023-12-25 DIAGNOSIS — J302 Other seasonal allergic rhinitis: Secondary | ICD-10-CM

## 2023-12-25 DIAGNOSIS — T7800XA Anaphylactic reaction due to unspecified food, initial encounter: Secondary | ICD-10-CM | POA: Diagnosis not present

## 2023-12-25 DIAGNOSIS — J3089 Other allergic rhinitis: Secondary | ICD-10-CM

## 2023-12-25 MED ORDER — EPINEPHRINE 0.3 MG/0.3ML IJ SOAJ: 0.3000 mg | Freq: Once | INTRAMUSCULAR | 2 refills | Status: AC

## 2023-12-25 NOTE — Progress Notes (Signed)
 [address] [place] [address] Dept: [id number]  FOLLOW UP NOTE  Patient ID: Alexa West, female    DOB: 04/02/1971  Age: 53 y.o. MRN: [id number] Date of Office Visit: 12/25/2023  Assessment  Chief Complaint: Allergy Testing  HPI Alexa West is a 53 year old female who presents to the clinic for a follow-up visit with allergy skin testing.  She was last seen in this clinic on 12/09/2023 as a new patient by Dr. Idolina Maker for evaluation of rhinitis, asthma, atopic dermatitis, and food allergy to tree nuts.  At today's visit, she reports that she is feeling well overall with no cardiopulmonary, gastrointestinal, or integumentary symptoms.  She has not had any antihistamines over the last 3 days.  She does report that mosquito bites generally swell and become very erythematous.  She also reports getting stung by a stinging insect as a child with large local reactions and some possible anaphylactic reaction.  She is requesting stinging insect allergy testing at today's visit.  Her current medications are listed in the chart.  Drug Allergies:  Allergies  Allergen Reactions   Cashew Nut Oil    Milk-Related Compounds Diarrhea   Wheat    Cephalosporins Hives and Rash   Penicillins Hives and Rash    Diagnostics: Percutaneous environmental allergy testing was positive to grass pollen, tree pollen, and dog with adequate controls  Intradermal allergy testing was negative to ragweed mix, weed mix, mold mix 2, mold mix 4, dust mite, cat hair, and cockroach with an adequate control  Percutaneous food allergy testing was equivocal to garlic and negative to peanut, wheat, milk, tree nuts, coconut, strawberry, and black pepper with adequate controls  Assessment and Plan: 1. Allergy with anaphylaxis due to food   2. Hymenoptera allergy   3. Seasonal and perennial allergic rhinitis     Meds ordered this encounter  Medications   EPINEPHrine (EPIPEN 2-PAK) 0.3 mg/0.3 mL  IJ SOAJ injection    Sig: Inject 0.3 mg into the muscle once for 1 dose.    Dispense:  1 each    Refill:  2    Patient Instructions  Allergic rhinitis Continue skin testing was positive to grass pollen, tree pollen, dog, cat, dust mite, cockroach, outdoor mold, ragweed mix, and weed mix. Continue an antihistamine once a day if needed for runny nose or itch Continue Flonase 2 sprays in each nostril once a day if needed for stuffy nose Consider saline nasal rinses as needed for nasal symptoms. Use this before any medicated nasal sprays for best result Consider allergen immunotherapy if your symptoms are not well-controlled with the treatment plan as listed below  Asthma Continue Airsupra  2 puffs once every 4 hours if needed for cough or wheeze.  Do not use this medication longer more than 12 puffs in a 24-hour time span  Atopic dermatitis  Continue twice a day moisturizing routine Continue tacrolimus  to red and itchy areas up to twice a day if needed For stubborn red itchy areas underneath your face, continue clobetasol  once or twice a day if needed.  This is a very strong steroid.  Do not use this medication longer than 2 weeks in a row Consider Ebglyss for Dupixent for atopic dermatitis control  Food allergy Your skin testing was equivocal to garlic.  In case of an allergic reaction, give Benadryl 50 mg every 4 hours, and if life-threatening symptoms occur, inject with EpiPen 0.3 mg.  A lab order has been placed to  help us  evaluate your food allergens.  Continue to avoid peanut, wheat, milk, tree nuts, coconut, strawberry, black pepper, and garlic.  We will call you when the lab results become available  Stinging insect allergy A lab order has been placed to help us  eva all okay.  Luate your stinging insect allergy. In case of an allergic reaction, give Benadryl 50 mg every 4 hours, and if life-threatening symptoms occur, inject with EpiPen 0.3 mg.   Call the clinic if this treatment  plan is not working well for you.  Follow up in 1 month or sooner if needed.   Return in about 4 weeks (around 01/22/2024), or if symptoms worsen or fail to improve.    Thank you for the opportunity to care for this patient.  Please do not hesitate to contact me with questions.  Marinus Sic, FNP [facility] 

## 2023-12-25 NOTE — Patient Instructions (Addendum)
 Allergic rhinitis Continue skin testing was positive to grass pollen, tree pollen, dog, cat, dust mite, cockroach, outdoor mold, ragweed mix, and weed mix. Continue an antihistamine once a day if needed for runny nose or itch Continue Flonase 2 sprays in each nostril once a day if needed for stuffy nose Consider saline nasal rinses as needed for nasal symptoms. Use this before any medicated nasal sprays for best result Consider allergen immunotherapy if your symptoms are not well-controlled with the treatment plan as listed below  Asthma Continue Airsupra  2 puffs once every 4 hours if needed for cough or wheeze.  Do not use this medication longer more than 12 puffs in a 24-hour time span  Atopic dermatitis  Continue twice a day moisturizing routine Continue tacrolimus  to red and itchy areas up to twice a day if needed For stubborn red itchy areas underneath your face, continue clobetasol  once or twice a day if needed.  This is a very strong steroid.  Do not use this medication longer than 2 weeks in a row Consider Ebglyss for Dupixent for atopic dermatitis control  Food allergy  Your skin testing was equivocal to garlic.  In case of an allergic reaction, give Benadryl 50 mg every 4 hours, and if life-threatening symptoms occur, inject with EpiPen  0.3 mg.  A lab order has been placed to help us  evaluate your food allergens.  Continue to avoid peanut, wheat, milk, tree nuts, coconut, strawberry, black pepper, and garlic.  We will call you when the lab results become available  Stinging insect allergy  A lab order has been placed to help us  eva all okay.  Luate your stinging insect allergy . In case of an allergic reaction, give Benadryl 50 mg every 4 hours, and if life-threatening symptoms occur, inject with EpiPen  0.3 mg.   Call the clinic if this treatment plan is not working well for you.  Follow up in 1 month or sooner if needed.

## 2023-12-25 NOTE — Addendum Note (Signed)
 Addended by: Minor Amble on: 12/25/2023 06:08 PM   Modules accepted: Orders

## 2023-12-27 LAB — HYMENOPTERA VENOM ALLERGY II

## 2023-12-28 LAB — IGE MILK W/ COMPONENT REFLEX

## 2023-12-28 LAB — HYMENOPTERA VENOM ALLERGY II

## 2023-12-30 ENCOUNTER — Ambulatory Visit: Payer: Self-pay | Admitting: Family Medicine

## 2023-12-30 LAB — HYMENOPTERA VENOM ALLERGY II
I001-IgE Honeybee: 0.11 kU/L — AB
Tryptase: 3.4 ug/L (ref 2.2–13.2)

## 2023-12-30 LAB — ALLERGENS(7): Hazelnut (Filbert) IgE: 0.11 kU/L — AB

## 2023-12-30 LAB — ALLERGEN COMPONENT COMMENTS

## 2023-12-30 LAB — PANEL 603848
F076-IgE Alpha Lactalbumin: <0.1 kU/L
F077-IgE Beta Lactoglobulin: 0.14 kU/L — AB
F078-IgE Casein: <0.1 kU/L

## 2023-12-30 LAB — ALLERGEN, BLACK PEPPER,F280

## 2023-12-30 LAB — ALLERGEN, GARLIC, F47: Allergen Garlic IgE: 0.23 kU/L — AB

## 2023-12-30 LAB — ALLERGEN, STRAWBERRY, F44: Allergen Strawberry IgE: 0.18 kU/L — AB

## 2023-12-30 LAB — ALLERGEN, WHEAT, F4: Wheat IgE: 1.79 kU/L — AB

## 2023-12-30 LAB — O214-IGE CCD DETERMINANTS: O214-IgE CCD Determinants: <0.1 kU/L

## 2023-12-30 LAB — ALLERGEN COCONUT IGE

## 2023-12-30 LAB — IGE MILK W/ COMPONENT REFLEX: F002-IgE Milk: 0.48 kU/L — AB

## 2023-12-30 NOTE — Progress Notes (Signed)
 Can you please let this patient know that her testing has all returned. She had negative results to coconut and pepper. Borderline positive results to hazelnut and strawberry. Low levels to milk and garlic. And elevated level to wheat. Continue to avoid these foods and can challenge in the clinic: coconut, pepper, hazelnut, strawberry, milk, and garlic. Please have access to a set of epinephrine  auto injectors. Honeybee was just one mark over negative. Would suggest skin testing to venoms in [place]. Thank you

## 2024-01-02 ENCOUNTER — Ambulatory Visit: Payer: Self-pay

## 2024-01-14 DIAGNOSIS — L814 Other melanin hyperpigmentation: Secondary | ICD-10-CM | POA: Diagnosis not present

## 2024-01-14 DIAGNOSIS — L821 Other seborrheic keratosis: Secondary | ICD-10-CM | POA: Diagnosis not present

## 2024-01-14 DIAGNOSIS — D225 Melanocytic nevi of trunk: Secondary | ICD-10-CM | POA: Diagnosis not present

## 2024-01-14 DIAGNOSIS — D229 Melanocytic nevi, unspecified: Secondary | ICD-10-CM | POA: Diagnosis not present

## 2024-01-14 DIAGNOSIS — L578 Other skin changes due to chronic exposure to nonionizing radiation: Secondary | ICD-10-CM | POA: Diagnosis not present

## 2024-01-14 DIAGNOSIS — W57XXXA Bitten or stung by nonvenomous insect and other nonvenomous arthropods, initial encounter: Secondary | ICD-10-CM | POA: Diagnosis not present

## 2024-01-14 DIAGNOSIS — L309 Dermatitis, unspecified: Secondary | ICD-10-CM | POA: Diagnosis not present

## 2024-01-22 ENCOUNTER — Encounter: Payer: Self-pay | Admitting: Family Medicine

## 2024-01-22 ENCOUNTER — Ambulatory Visit: Admitting: Family Medicine

## 2024-01-22 ENCOUNTER — Ambulatory Visit

## 2024-01-22 ENCOUNTER — Telehealth: Payer: Self-pay | Admitting: Family Medicine

## 2024-01-22 VITALS — BP 124/82 | HR 81 | Temp 98.7°F | Ht 66.0 in | Wt 328.0 lb

## 2024-01-22 DIAGNOSIS — G8929 Other chronic pain: Secondary | ICD-10-CM

## 2024-01-22 DIAGNOSIS — M1712 Unilateral primary osteoarthritis, left knee: Secondary | ICD-10-CM | POA: Diagnosis not present

## 2024-01-22 DIAGNOSIS — L0291 Cutaneous abscess, unspecified: Secondary | ICD-10-CM | POA: Diagnosis not present

## 2024-01-22 DIAGNOSIS — Z6841 Body Mass Index (BMI) 40.0 and over, adult: Secondary | ICD-10-CM

## 2024-01-22 DIAGNOSIS — M25562 Pain in left knee: Secondary | ICD-10-CM

## 2024-01-22 MED ORDER — MELOXICAM 15 MG PO TABS: 15.0000 mg | ORAL_TABLET | Freq: Every day | ORAL | 1 refills | Status: AC

## 2024-01-22 MED ORDER — DOXYCYCLINE HYCLATE 100 MG PO CAPS: 100.0000 mg | ORAL_CAPSULE | Freq: Two times a day (BID) | ORAL | 0 refills | Status: DC

## 2024-01-22 NOTE — Patient Instructions (Addendum)
 Try [place] and Ashwaganda.  May try GABA and melatonin for sleep.  We are getting an xray today. We will be in contact with any abnormal results that require further attention.  I have sent in meloxicam  for you to take once daily.  This is an anti-inflammatory.   Do not take ibuprofen, Aleve, aspirin with this medication.  You may take Tylenol  with this medication.  Eat with this medication, it can upset your stomach if you do not.  I have sent in a prescription for doxycycline  for you to take twice a day for 7 days.  Take this medication with food as it can upset your stomach if you do not.  Follow-up with me for new or worsening symptoms.

## 2024-01-22 NOTE — Telephone Encounter (Signed)
 Left knee still giving me pain.. No recent falls. Can you do an X-ray? Nothing seems to "comfort" it. I have taken the oxycodone / methocarbamol  that you gave me for the hip pain. I don't take it but once a week if that. Can't tell a difference .   JUST A FYI BEFORE APPT  DATE: 6.26.25 @ 2:20PM

## 2024-01-22 NOTE — Progress Notes (Signed)
 Acute Office Visit  Subjective:     Patient ID: Alexa West, female    DOB: 06-Jan-1971, 53 y.o.   MRN: [id number]  Chief Complaint  Patient presents with   Knee Pain    Pt stated that she has been dealing with left knee pain for years but now its getting worse. Anything that she does now triggers it. She keeps it elevated while at home and painful to the touch    Knee Pain    Patient is in today for evaluation of left knee pain.  Has history of chronic left knee pain, has been getting worse recently. Worse with activity, keeps the left leg elevated when she is at home, pain mildly improves with rest. Has not been treating with oral medications at home, states she is out of meloxicam  and that this helped, requesting refill. States she does not take ibuprofen or Tylenol  for it as this usually does not help her. Denies known injury, numbness, tingling, other concerns.  Separately reports rash and bump to upper left abdomen just below the breast. States that it has been so hot outside and she has been sweating a lot, noticed that she has a bump that is now red, tender, warm to touch.  Denies any discharge, bleeding, fevers, chills, abdominal pain, nausea, vomiting, diarrhea, other symptoms.   ROS Per HPI      Objective:    BP 124/82   Pulse 81   Temp 98.7 F (37.1 C)   Ht 5' 6 (1.676 m)   Wt (!) 328 lb (148.8 kg)   LMP 02/25/2023   SpO2 100%   BMI 52.94 kg/m    Physical Exam Vitals and nursing note reviewed.  Constitutional:      General: She is not in acute distress.    Appearance: Normal appearance. She is obese.  HENT:     Head: Normocephalic and atraumatic.     Right Ear: External ear normal.     Left Ear: External ear normal.     Nose: Nose normal.     Mouth/Throat:     Mouth: Mucous membranes are moist.     Pharynx: Oropharynx is clear.   Eyes:     Extraocular Movements: Extraocular movements intact.     Conjunctiva/sclera: Conjunctivae  normal.     Pupils: Pupils are equal, round, and reactive to light.   Neck:     Thyroid : No thyromegaly.   Cardiovascular:     Rate and Rhythm: Normal rate and regular rhythm.     Pulses: Normal pulses.     Heart sounds: Normal heart sounds.  Pulmonary:     Effort: Pulmonary effort is normal. No respiratory distress.     Breath sounds: Normal breath sounds. No wheezing, rhonchi or rales.  Abdominal:     General: Abdomen is flat. Bowel sounds are normal.     Palpations: Abdomen is soft.   Musculoskeletal:        General: Swelling and tenderness present.     Cervical back: Normal range of motion and neck supple.     Right lower leg: No edema.     Left lower leg: No edema.     Comments: Mildly swollen and tender to anterior inferior medial aspect of the left knee.  No heat, no erythema, no bruising, no obvious deformity noted.  Mild limited ROM  Lymphadenopathy:     Cervical: No cervical adenopathy.   Skin:    General: Skin is warm and dry.  Capillary Refill: Capillary refill takes less than 2 seconds.     Findings: Lesion present.         Comments: 0.5 cm area of erythema, tenderness, minimal induration, warmth.  No bleeding, no discharge   Neurological:     General: No focal deficit present.     Mental Status: She is alert and oriented to person, place, and time.   Psychiatric:        Mood and Affect: Mood normal.        Behavior: Behavior normal.        Thought Content: Thought content normal.     No results found for any visits on 01/22/24.      Assessment & Plan:   Chronic pain of left knee -     DG KNEE 3 VIEW LEFT; Future -     Meloxicam ; Take 1 tablet (15 mg total) by mouth daily.  Dispense: 90 tablet; Refill: 1  Abscess Assessment & Plan: Doxycycline  100 mg twice daily x 7 days to pharmacy Follow-up if not improving  Orders: -     Doxycycline  Hyclate; Take 1 capsule (100 mg total) by mouth 2 (two) times daily for 7 days.  Dispense: 14 capsule;  Refill: 0  Morbid obesity with BMI of 50.0-59.9, adult (HCC) Assessment & Plan: Discussed healthy diet and activity level Understand us  is difficult with chronic knee pain      Meds ordered this encounter  Medications   meloxicam  (MOBIC ) 15 MG tablet    Sig: Take 1 tablet (15 mg total) by mouth daily.    Dispense:  90 tablet    Refill:  1   doxycycline  (VIBRAMYCIN ) 100 MG capsule    Sig: Take 1 capsule (100 mg total) by mouth 2 (two) times daily for 7 days.    Dispense:  14 capsule    Refill:  0    Return if symptoms worsen or fail to improve.  Corean LITTIE Ku, FNP

## 2024-01-24 DIAGNOSIS — G8929 Other chronic pain: Secondary | ICD-10-CM | POA: Insufficient documentation

## 2024-01-24 DIAGNOSIS — L0291 Cutaneous abscess, unspecified: Secondary | ICD-10-CM | POA: Insufficient documentation

## 2024-01-24 NOTE — Assessment & Plan Note (Signed)
 Doxycycline  100 mg twice daily x 7 days to pharmacy Follow-up if not improving

## 2024-01-24 NOTE — Assessment & Plan Note (Signed)
 Discussed healthy diet and activity level Understand us  is difficult with chronic knee pain

## 2024-01-24 NOTE — Assessment & Plan Note (Signed)
 Meloxicam  15 mg once daily to pharmacy X-ray of the knee ordered today

## 2024-01-25 NOTE — Progress Notes (Deleted)
 Follow Up Note  RE: Alexa West MRN: [id number] DOB: 1970/10/08 Date of Office Visit: 01/26/2024  Referring provider: Alvia Corean CROME, * Primary care provider: Alvia Corean CROME, FNP  Chief Complaint: No chief complaint on file.  History of Present Illness: I had the pleasure of seeing Alexa West for a follow up visit at the [facility] on 01/26/2024. She is a 53 y.o. female, who is being followed for allergic rhinitis, asthma, atopic dermatitis, adverse reaction and stinging insect reaction. Her previous allergy  office visit was on 12/25/2023 with Arlean Mutter, FNP. Today is a regular follow up visit.  Discussed the use of AI scribe software for clinical note transcription with the patient, who gave verbal consent to proceed.  History of Present Illness            2025 labs: She had negative results to coconut and pepper. Borderline positive results to hazelnut and strawberry. Low levels to milk and garlic. And elevated level to wheat. Continue to avoid these foods and can challenge in the clinic: coconut, pepper, hazelnut, strawberry, milk, and garlic. Please have access to a set of epinephrine  auto injectors. Honeybee was just one mark over negative. Would suggest skin testing to venoms in [place]  Assessment and Plan: Alexa West is a 53 y.o. female with: Allergic rhinitis Continue skin testing was positive to grass pollen, tree pollen, dog, cat, dust mite, cockroach, outdoor mold, ragweed mix, and weed mix. Continue an antihistamine once a day if needed for runny nose or itch Continue Flonase 2 sprays in each nostril once a day if needed for stuffy nose Consider saline nasal rinses as needed for nasal symptoms. Use this before any medicated nasal sprays for best result Consider allergen immunotherapy if your symptoms are not well-controlled with the treatment plan as listed below   Asthma Continue Airsupra  2 puffs once every 4 hours if  needed for cough or wheeze.  Do not use this medication longer more than 12 puffs in a 24-hour time span   Atopic dermatitis  Continue twice a day moisturizing routine Continue tacrolimus  to red and itchy areas up to twice a day if needed For stubborn red itchy areas underneath your face, continue clobetasol  once or twice a day if needed.  This is a very strong steroid.  Do not use this medication longer than 2 weeks in a row Consider Ebglyss for Dupixent for atopic dermatitis control   Food allergy  Your skin testing was equivocal to garlic.  In case of an allergic reaction, give Benadryl 50 mg every 4 hours, and if life-threatening symptoms occur, inject with EpiPen  0.3 mg.  A lab order has been placed to help us  evaluate your food allergens.  Continue to avoid peanut, wheat, milk, tree nuts, coconut, strawberry, black pepper, and garlic.  We will call you when the lab results become available   Stinging insect allergy  A lab order has been placed to help us  eva all okay.  Luate your stinging insect allergy . In case of an allergic reaction, give Benadryl 50 mg every 4 hours, and if life-threatening symptoms occur, inject with EpiPen  0.3 mg.    Call the clinic if this treatment plan is not working well for you. Assessment and Plan              No follow-ups on file.  No orders of the defined types were placed in this encounter.  Lab Orders  No laboratory test(s) ordered today  Diagnostics: Spirometry:  Tracings reviewed. Her effort: {Blank single:19197::Good reproducible efforts.,It was hard to get consistent efforts and there is a question as to whether this reflects a maximal maneuver.,Poor effort, data can not be interpreted.} FVC: ***L FEV1: ***L, ***% predicted FEV1/FVC ratio: ***% Interpretation: {Blank single:19197::Spirometry consistent with mild obstructive disease,Spirometry consistent with moderate obstructive disease,Spirometry consistent with severe  obstructive disease,Spirometry consistent with possible restrictive disease,Spirometry consistent with mixed obstructive and restrictive disease,Spirometry uninterpretable due to technique,Spirometry consistent with normal pattern,No overt abnormalities noted given today's efforts}.  Please see scanned spirometry results for details.  Skin Testing: {Blank single:19197::Select foods,Environmental allergy  panel,Environmental allergy  panel and select foods,Food allergy  panel,None,Deferred due to recent antihistamines use}. *** Results discussed with patient/family.   Medication List:  Current Outpatient Medications  Medication Sig Dispense Refill  . albuterol  (VENTOLIN  HFA) 108 (90 Base) MCG/ACT inhaler Inhale 2 puffs into the lungs every 6 (six) hours as needed for wheezing or shortness of breath. 18 g 0  . Albuterol -Budesonide  (AIRSUPRA ) 90-80 MCG/ACT AERO Inhale 2 sprays into the lungs 4 (four) times daily as needed. 10.7 g 1  . AUVI-Q  0.3 MG/0.3ML SOAJ injection     . budesonide -formoterol  (SYMBICORT ) 160-4.5 MCG/ACT inhaler INHALE 2 PUFFS INTO THE LUNGS TWICE DAILY 10.2 g 0  . doxycycline  (VIBRAMYCIN ) 100 MG capsule Take 1 capsule (100 mg total) by mouth 2 (two) times daily for 7 days. 14 capsule 0  . meloxicam  (MOBIC ) 15 MG tablet Take 1 tablet (15 mg total) by mouth daily. 90 tablet 1  . methocarbamol  (ROBAXIN ) 500 MG tablet Take 1 tablet (500 mg total) by mouth every 8 (eight) hours as needed for muscle spasms. 30 tablet 0  . ondansetron  (ZOFRAN ) 4 MG tablet Take 1 tablet (4 mg total) by mouth every 8 (eight) hours as needed for nausea or vomiting. 30 tablet 0  . pantoprazole  (PROTONIX ) 40 MG tablet Take 1 tablet (40 mg total) by mouth daily. 90 tablet 3  . promethazine  (PHENERGAN ) 25 MG suppository Place 1 suppository (25 mg total) rectally every 6 (six) hours as needed for nausea or vomiting. 12 each 0  . promethazine  (PHENERGAN ) 25 MG tablet Take 1 tablet (25 mg  total) by mouth every 6 (six) hours as needed for nausea or vomiting. 30 tablet 0  . rizatriptan  (MAXALT ) 10 MG tablet TAKE 1 TABLET BY MOUTH AS NEEDED FOR MIGRAINE. MAY REPEAT IN 2 HOURS IF NEEDED 10 tablet 3  . Spacer/Aero-Holding Chambers (AEROCHAMBER PLUS) inhaler Use as instructed to use with inahaler. 1 each 1  . tacrolimus  (PROTOPIC ) 0.1 % ointment Apply topically 2 (two) times daily. OK to use head to toe. 100 g 3   No current facility-administered medications for this visit.   Allergies: Allergies  Allergen Reactions  . Cashew Nut Oil   . Milk-Related Compounds Diarrhea  . Wheat   . Cephalosporins Hives and Rash  . Penicillins Hives and Rash   I reviewed her past medical history, social history, family history, and environmental history and no significant changes have been reported from her previous visit.  Review of Systems  Constitutional:  Negative for appetite change, chills, fever and unexpected weight change.  HENT:  Negative for congestion and rhinorrhea.   Eyes:  Negative for itching.  Respiratory:  Negative for cough, chest tightness, shortness of breath and wheezing.   Cardiovascular:  Negative for chest pain.  Gastrointestinal:  Negative for abdominal pain.  Genitourinary:  Negative for difficulty urinating.  Skin:  Negative for rash.  Neurological:  Negative for headaches.   Objective: LMP 02/25/2023  There is no height or weight on file to calculate BMI. Physical Exam Vitals and nursing note reviewed.  Constitutional:      Appearance: Normal appearance. She is well-developed.  HENT:     Head: Normocephalic and atraumatic.     Right Ear: Tympanic membrane and external ear normal.     Left Ear: Tympanic membrane and external ear normal.     Nose: Nose normal.     Mouth/Throat:     Mouth: Mucous membranes are moist.     Pharynx: Oropharynx is clear.   Eyes:     Conjunctiva/sclera: Conjunctivae normal.    Cardiovascular:     Rate and Rhythm: Normal  rate and regular rhythm.     Heart sounds: Normal heart sounds. No murmur heard.    No friction rub. No gallop.  Pulmonary:     Effort: Pulmonary effort is normal.     Breath sounds: Normal breath sounds. No wheezing, rhonchi or rales.   Musculoskeletal:     Cervical back: Neck supple.   Skin:    General: Skin is warm.     Findings: No rash.   Neurological:     Mental Status: She is alert and oriented to person, place, and time.   Psychiatric:        Behavior: Behavior normal.  Previous notes and tests were reviewed. The plan was reviewed with the patient/family, and all questions/concerned were addressed.  It was my pleasure to see Alexa West today and participate in her care. Please feel free to contact me with any questions or concerns.  Sincerely,  Orlan Cramp, DO Allergy  & Immunology  [facility]  [place] office: [phone] [facility] office: [phone]

## 2024-01-26 ENCOUNTER — Ambulatory Visit: Payer: Self-pay | Admitting: Family Medicine

## 2024-01-26 ENCOUNTER — Ambulatory Visit: Admitting: Allergy

## 2024-01-26 DIAGNOSIS — G8929 Other chronic pain: Secondary | ICD-10-CM

## 2024-01-29 ENCOUNTER — Other Ambulatory Visit: Payer: Self-pay | Admitting: Family Medicine

## 2024-01-29 DIAGNOSIS — L0291 Cutaneous abscess, unspecified: Secondary | ICD-10-CM

## 2024-02-02 ENCOUNTER — Encounter: Payer: Self-pay | Admitting: Family Medicine

## 2024-02-02 DIAGNOSIS — R21 Rash and other nonspecific skin eruption: Secondary | ICD-10-CM

## 2024-02-02 DIAGNOSIS — M2559 Pain in other specified joint: Secondary | ICD-10-CM

## 2024-02-04 ENCOUNTER — Other Ambulatory Visit: Payer: Self-pay | Admitting: Allergy & Immunology

## 2024-02-04 DIAGNOSIS — J4520 Mild intermittent asthma, uncomplicated: Secondary | ICD-10-CM

## 2024-02-08 ENCOUNTER — Encounter: Payer: Self-pay | Admitting: Family Medicine

## 2024-02-09 ENCOUNTER — Other Ambulatory Visit: Payer: Self-pay

## 2024-02-09 DIAGNOSIS — K429 Umbilical hernia without obstruction or gangrene: Secondary | ICD-10-CM

## 2024-02-09 NOTE — Telephone Encounter (Signed)
 Referral placed.

## 2024-02-10 DIAGNOSIS — L309 Dermatitis, unspecified: Secondary | ICD-10-CM | POA: Diagnosis not present

## 2024-02-10 DIAGNOSIS — M6740 Ganglion, unspecified site: Secondary | ICD-10-CM | POA: Diagnosis not present

## 2024-02-25 DIAGNOSIS — G4733 Obstructive sleep apnea (adult) (pediatric): Secondary | ICD-10-CM | POA: Diagnosis not present

## 2024-03-02 ENCOUNTER — Ambulatory Visit: Admitting: Orthopaedic Surgery

## 2024-03-02 ENCOUNTER — Encounter: Payer: Self-pay | Admitting: Orthopaedic Surgery

## 2024-03-02 DIAGNOSIS — M79672 Pain in left foot: Secondary | ICD-10-CM | POA: Diagnosis not present

## 2024-03-02 DIAGNOSIS — M1712 Unilateral primary osteoarthritis, left knee: Secondary | ICD-10-CM | POA: Insufficient documentation

## 2024-03-02 DIAGNOSIS — M79671 Pain in right foot: Secondary | ICD-10-CM | POA: Diagnosis not present

## 2024-03-02 DIAGNOSIS — Z6841 Body Mass Index (BMI) 40.0 and over, adult: Secondary | ICD-10-CM | POA: Diagnosis not present

## 2024-03-02 MED ORDER — METHYLPREDNISOLONE 4 MG PO TBPK
ORAL_TABLET | ORAL | 0 refills | Status: DC
Start: 2024-03-02 — End: 2024-04-20

## 2024-03-02 MED ORDER — METHYLPREDNISOLONE ACETATE 40 MG/ML IJ SUSP
40.0000 mg | INTRAMUSCULAR | Status: AC | PRN
Start: 2024-03-02 — End: 2024-03-02
  Administered 2024-03-02: 40 mg via INTRA_ARTICULAR

## 2024-03-02 MED ORDER — BUPIVACAINE HCL 0.5 % IJ SOLN
2.0000 mL | INTRAMUSCULAR | Status: AC | PRN
  Administered 2024-03-02: 2 mL via INTRA_ARTICULAR

## 2024-03-02 MED ORDER — LIDOCAINE HCL 1 % IJ SOLN
2.0000 mL | INTRAMUSCULAR | Status: AC | PRN
Start: 2024-03-02 — End: 2024-03-02
  Administered 2024-03-02: 2 mL

## 2024-03-02 NOTE — Progress Notes (Signed)
 Office Visit Note   Patient: Alexa West           Date of Birth: [birth year]           MRN: [id number] Visit Date: 03/02/2024              Requested by: Alvia Corean CROME, FNP [address] [place],  [place] [address] PCP: Alvia Corean CROME, FNP   Assessment & Plan: Visit Diagnoses:  1. Primary osteoarthritis of left knee   2. Pain in both feet   3. Body mass index 50.0-59.9, adult (HCC)     Plan: History of Present Illness Alexa West is a 53 year old female with arthritis who presents with worsening foot and knee pain. She was referred by her primary care provider for evaluation of her left knee pain.  She experiences left knee pain with a sensation of something 'shooting' through her knee when standing. She has received cortisone injections in the past but not recently. There are no recent injuries, and she is not diabetic.  She also has pain in her feet, ankles, and knees, with recent episodes affecting her fourth toe down to her ankles on both feet. She describes pain in her arches and heels, with a sensation of 'a stick coming through' below her big toe. The pain has worsened over the last few months, becoming severe by the end of the day. Her work at a pharmacy requires prolonged standing, exacerbating her symptoms. Custom orthotics and inserts have provided temporary relief. Her ankle remains swollen due to a past fracture.  She avoids ibuprofen due to a history of bleeding ulcers and uses meloxicam  sparingly.  Results RADIOLOGY Foot X-ray: Diffuse arthritis across midfoot (09/03/2023) Left Knee X-ray: Advanced arthritis, large osteophytes, bone-on-bone contact  Assessment and Plan Degenerative arthritis of both feet and ankles Chronic pain and swelling due to degenerative arthritis, exacerbated by weight and history of fracture. NSAID use limited by history of bleeding ulcers. - Prescribed Medrol  Dosepak for short-term relief. -  Recommended Voltaren  gel for topical pain relief. - Discussed weight management to reduce foot pressure. - Consider referral to foot and ankle surgeon for potential surgical intervention.  Advanced osteoarthritis of left knee Advanced osteoarthritis with bone on bone arthritis and large bone spurs, exacerbated by weight. - Administered cortisone injection in the left knee for pain relief.  Follow-Up Instructions: No follow-ups on file.   Orders:  Orders Placed This Encounter  Procedures   Large Joint Inj   Meds ordered this encounter  Medications   methylPREDNISolone  (MEDROL  DOSEPAK) 4 MG TBPK tablet    Sig: Take as directed    Dispense:  21 tablet    Refill:  0      Procedures: Large Joint Inj: L knee on 03/02/2024 8:28 AM Details: 22 G needle Medications: 2 mL bupivacaine  0.5 %; 2 mL lidocaine  1 %; 40 mg methylPREDNISolone  acetate 40 MG/ML Outcome: tolerated well, no immediate complications Patient was prepped and draped in the usual sterile fashion.       Clinical Data: No additional findings.   Subjective: Chief Complaint  Patient presents with   Left Foot - Pain   Right Foot - Pain    HPI  Review of Systems  Constitutional: Negative.   HENT: Negative.    Eyes: Negative.   Respiratory: Negative.    Cardiovascular: Negative.   Endocrine: Negative.   Musculoskeletal: Negative.   Neurological: Negative.   Hematological: Negative.   Psychiatric/Behavioral:  Negative.    All other systems reviewed and are negative.    Objective: Vital Signs: LMP 02/25/2023   Physical Exam Vitals and nursing note reviewed.  Constitutional:      Appearance: She is well-developed.  HENT:     Head: Atraumatic.     Nose: Nose normal.  Eyes:     Extraocular Movements: Extraocular movements intact.  Cardiovascular:     Pulses: Normal pulses.  Pulmonary:     Effort: Pulmonary effort is normal.  Abdominal:     Palpations: Abdomen is soft.  Musculoskeletal:      Cervical back: Neck supple.  Skin:    General: Skin is warm.     Capillary Refill: Capillary refill takes less than 2 seconds.  Neurological:     Mental Status: She is alert. Mental status is at baseline.  Psychiatric:        Behavior: Behavior normal.        Thought Content: Thought content normal.        Judgment: Judgment normal.     Ortho Exam  Specialty Comments:  No specialty comments available.  Imaging: No results found.   PMFS History: Patient Active Problem List   Diagnosis Date Noted   Primary osteoarthritis of left knee 03/02/2024   Pain in both feet 03/02/2024   Chronic pain of left knee 01/24/2024   Abscess 01/24/2024   Other atopic dermatitis 10/03/2023   Attention deficit 10/03/2023   Moderate obstructive sleep apnea 08/15/2023   Excessive daytime sleepiness 06/09/2023   Loud snoring 06/09/2023   Atypical chest pain 06/09/2023   Recurrent major depressive disorder, in partial remission (HCC) 05/24/2022   Hyperlipidemia 05/03/2021   Generalized osteoarthritis of multiple sites 05/17/2020   Fatigue 05/17/2020   Chronic pain disorder 05/17/2020   Primary osteoarthritis of left hip 03/03/2020   Right knee pain 10/27/2018   Neck pain 05/13/2018   Morbid obesity with BMI of 50.0-59.9, adult (HCC) 05/13/2018   PCOS (polycystic ovarian syndrome) 12/05/2017   History of IBS 12/05/2017   S/P cholecystectomy 12/05/2017   Gastric ulcer with hemorrhage 12/05/2017   Eosinophilic esophagitis 12/05/2017   Obesity, Class III, BMI 40-49.9 (morbid obesity) 10/13/2017   Migraine headache without aura 10/13/2017   GI bleed 08/20/2016   Transaminitis 08/20/2016   Prediabetes 08/20/2016   Lumbar pain 05/24/2014   Impaired fasting glucose 09/18/2010   Depression 03/04/2008   Esophageal reflux 03/04/2008   Past Medical History:  Diagnosis Date   Abnormal Pap smear of cervix 1996   HPV +   Angio-edema    Anxiety    Asthma    Chicken pox    Depression     Genital warts    GERD (gastroesophageal reflux disease)    History of diverticulitis    History of genital warts    Hx: UTI (urinary tract infection)    Migraines    Urine incontinence    Urticaria     Family History  Problem Relation Age of Onset   Arthritis Mother    Depression Mother    Hyperlipidemia Mother    Miscarriages / [place] Mother    Skin cancer Mother    Asthma Mother    Early death Father    Alcohol abuse Maternal Grandmother    Arthritis Maternal Grandmother    Depression Maternal Grandmother    Diabetes Maternal Grandmother    Hyperlipidemia Maternal Grandmother    Hypertension Maternal Grandmother    Kidney disease Maternal Grandmother    Arthritis  Maternal Grandfather    Hypertension Maternal Grandfather    Kidney disease Maternal Grandfather    Heart attack Maternal Grandfather    Leukemia Maternal Grandfather    Bone cancer Maternal Grandfather    Alcohol abuse Paternal Grandmother    Cancer Paternal Grandmother    Alcohol abuse Paternal Grandfather     Past Surgical History:  Procedure Laterality Date   ADENOIDECTOMY     CERVICAL BIOPSY  W/ LOOP ELECTRODE EXCISION  1996   CHOLECYSTECTOMY  1997   HERNIA REPAIR  1976   TONSILLECTOMY     TONSILLECTOMY AND ADENOIDECTOMY  1996   urethral stretching  1980   Social History   Occupational History   Not on file  Tobacco Use   Smoking status: Never   Smokeless tobacco: Never  Vaping Use   Vaping status: Never Used  Substance and Sexual Activity   Alcohol use: No   Drug use: No   Sexual activity: Yes    Partners: Male

## 2024-03-04 DIAGNOSIS — K432 Incisional hernia without obstruction or gangrene: Secondary | ICD-10-CM | POA: Diagnosis not present

## 2024-04-13 ENCOUNTER — Other Ambulatory Visit (INDEPENDENT_AMBULATORY_CARE_PROVIDER_SITE_OTHER)

## 2024-04-13 ENCOUNTER — Other Ambulatory Visit: Payer: Self-pay | Admitting: Family Medicine

## 2024-04-13 DIAGNOSIS — R21 Rash and other nonspecific skin eruption: Secondary | ICD-10-CM

## 2024-04-13 DIAGNOSIS — M2559 Pain in other specified joint: Secondary | ICD-10-CM | POA: Diagnosis not present

## 2024-04-13 LAB — COMPREHENSIVE METABOLIC PANEL WITH GFR
ALT: 82 U/L — ABNORMAL HIGH (ref 0–35)
AST: 91 U/L — ABNORMAL HIGH (ref 0–37)
Albumin: 3.8 g/dL (ref 3.5–5.2)
Alkaline Phosphatase: 111 U/L (ref 39–117)
BUN: 7 mg/dL (ref 6–23)
CO2: 26 meq/L (ref 19–32)
Calcium: 8.9 mg/dL (ref 8.4–10.5)
Chloride: 102 meq/L (ref 96–112)
Creatinine, Ser: 0.62 mg/dL (ref 0.40–1.20)
GFR: 102.58 mL/min (ref >60.00)
Glucose, Bld: 136 mg/dL — ABNORMAL HIGH (ref 70–99)
Potassium: 3.9 meq/L (ref 3.5–5.1)
Sodium: 137 meq/L (ref 135–145)
Total Bilirubin: 0.4 mg/dL (ref 0.2–1.2)
Total Protein: 6.9 g/dL (ref 6.0–8.3)

## 2024-04-13 LAB — CBC WITH DIFFERENTIAL/PLATELET
Basophils Absolute: 0 K/uL (ref 0.0–0.1)
Basophils Relative: 0.6 % (ref 0.0–3.0)
Eosinophils Absolute: 1.1 K/uL — ABNORMAL HIGH (ref 0.0–0.7)
Eosinophils Relative: 17.2 % — ABNORMAL HIGH (ref 0.0–5.0)
HCT: 40 % (ref 36.0–46.0)
Hemoglobin: 13.3 g/dL (ref 12.0–15.0)
Lymphocytes Relative: 29.2 % (ref 12.0–46.0)
Lymphs Abs: 1.8 K/uL (ref 0.7–4.0)
MCHC: 33.1 g/dL (ref 30.0–36.0)
MCV: 85.6 fl (ref 78.0–100.0)
Monocytes Absolute: 0.4 K/uL (ref 0.1–1.0)
Monocytes Relative: 5.8 % (ref 3.0–12.0)
Neutro Abs: 3 K/uL (ref 1.4–7.7)
Neutrophils Relative %: 47.2 % (ref 43.0–77.0)
Platelets: 210 K/uL (ref 150.0–400.0)
RBC: 4.68 Mil/uL (ref 3.87–5.11)
RDW: 14.5 % (ref 11.5–15.5)
WBC: 6.3 K/uL (ref 4.0–10.5)

## 2024-04-13 LAB — SEDIMENTATION RATE: Sed Rate: 31 mm/h — ABNORMAL HIGH (ref 0–30)

## 2024-04-13 LAB — C-REACTIVE PROTEIN: CRP: <1 mg/dL (ref 0.5–20.0)

## 2024-04-15 LAB — ANA,IFA RA DIAG PNL W/RFLX TIT/PATN
Anti Nuclear Antibody (ANA): NEGATIVE
Cyclic Citrullin Peptide Ab: <16 U
Rheumatoid fact SerPl-aCnc: <10 [IU]/mL (ref <14)

## 2024-04-19 ENCOUNTER — Encounter: Payer: Self-pay | Admitting: Family Medicine

## 2024-04-19 ENCOUNTER — Other Ambulatory Visit: Payer: Self-pay | Admitting: Family Medicine

## 2024-04-19 DIAGNOSIS — J4520 Mild intermittent asthma, uncomplicated: Secondary | ICD-10-CM

## 2024-04-20 ENCOUNTER — Encounter: Payer: Self-pay | Admitting: Family Medicine

## 2024-04-20 ENCOUNTER — Ambulatory Visit: Payer: Self-pay | Admitting: Family Medicine

## 2024-04-20 ENCOUNTER — Ambulatory Visit: Admitting: Family Medicine

## 2024-04-20 VITALS — BP 120/78 | HR 86 | Temp 97.7°F | Ht 66.0 in | Wt 319.6 lb

## 2024-04-20 DIAGNOSIS — R062 Wheezing: Secondary | ICD-10-CM

## 2024-04-20 DIAGNOSIS — J208 Acute bronchitis due to other specified organisms: Secondary | ICD-10-CM

## 2024-04-20 DIAGNOSIS — R509 Fever, unspecified: Secondary | ICD-10-CM | POA: Diagnosis not present

## 2024-04-20 DIAGNOSIS — R051 Acute cough: Secondary | ICD-10-CM

## 2024-04-20 DIAGNOSIS — H66001 Acute suppurative otitis media without spontaneous rupture of ear drum, right ear: Secondary | ICD-10-CM | POA: Diagnosis not present

## 2024-04-20 MED ORDER — DOXYCYCLINE HYCLATE 100 MG PO CAPS: 100.0000 mg | ORAL_CAPSULE | Freq: Two times a day (BID) | ORAL | 0 refills | Status: AC

## 2024-04-20 MED ORDER — HYDROCODONE BIT-HOMATROP MBR 5-1.5 MG/5ML PO SOLN: 5.0000 mL | Freq: Three times a day (TID) | ORAL | 0 refills | Status: DC | PRN

## 2024-04-20 MED ORDER — METHYLPREDNISOLONE ACETATE 80 MG/ML IJ SUSP
80.0000 mg | Freq: Once | INTRAMUSCULAR | Status: AC
  Administered 2024-04-20: 80 mg via INTRAMUSCULAR

## 2024-04-20 MED ORDER — AIRSUPRA 90-80 MCG/ACT IN AERO: 2.0000 | INHALATION_SPRAY | RESPIRATORY_TRACT | 1 refills | Status: AC

## 2024-04-20 MED ORDER — PREDNISONE 20 MG PO TABS: 40.0000 mg | ORAL_TABLET | Freq: Every day | ORAL | 0 refills | Status: AC

## 2024-04-20 MED ORDER — METHYLPREDNISOLONE ACETATE 80 MG/ML IJ SUSP: 80.0000 mg | Freq: Once | INTRAMUSCULAR | Status: DC

## 2024-04-20 NOTE — Patient Instructions (Signed)
 I have sent in a prescription for doxycycline  for you to take twice a day for 7 days.  Take this medication with food as it can upset your stomach if you do not.  You have received a steroid injection in the office today.  I have sent in prednisone  for you to take 2 tablets once daily in the morning with breakfast for the next 5 days.  Follow-up with me for new or worsening symptoms.

## 2024-04-20 NOTE — Progress Notes (Signed)
 Discussed in OV today

## 2024-04-20 NOTE — Progress Notes (Signed)
 Acute Office Visit  Subjective:     Patient ID: Alexa West, female    DOB: [birth year], 53 y.o.   MRN: [id number]  Chief Complaint  Patient presents with   Acute Visit    Productive cough/nasal congestion since Saturday of clear to yellow/dark green mucous, Fever 101 on Saturday, Sunday am fever 99, Sunday afternoon 97, Sunday evening 99, Temp 99 yesterday, also having sweats    HPI  53 year old female presents for evaluation of cough, congestion, fever up to 101, ear pain, chills and sweats for the last 4 days. Declines flu and COVID testing. States that her boss at work was sick first and then it seems to be passing through her coworkers. Has been taking OTC cough and cold with little relief. Has not really been using her inhalers. Denies abdominal pain, nausea, vomiting, diarrhea, other symptoms.    ROS Per HPI      Objective:    BP 120/78 (BP Location: Left Arm)   Pulse 86   Temp 97.7 F (36.5 C)   Ht 5' 6 (1.676 m)   Wt (!) 319 lb 9.6 oz (145 kg)   LMP 02/25/2023   SpO2 97%   BMI 51.58 kg/m    Physical Exam Vitals and nursing note reviewed.  Constitutional:      General: She is not in acute distress.    Appearance: Normal appearance. She is ill-appearing.  HENT:     Head: Normocephalic and atraumatic.     Right Ear: External ear normal. A middle ear effusion is present. Tympanic membrane is erythematous and bulging.     Left Ear: External ear normal.     Nose: Congestion present.     Mouth/Throat:     Mouth: Mucous membranes are moist.     Comments: Oropharyngeal cobblestoning   Eyes:     Extraocular Movements: Extraocular movements intact.     Pupils: Pupils are equal, round, and reactive to light.  Cardiovascular:     Rate and Rhythm: Normal rate and regular rhythm.     Pulses: Normal pulses.     Heart sounds: Normal heart sounds.  Pulmonary:     Effort: Pulmonary effort is normal. No respiratory distress.     Breath sounds: Wheezing  present. No rhonchi or rales.     Comments: Moderate productive cough in office Musculoskeletal:        General: Normal range of motion.     Cervical back: Normal range of motion.     Right lower leg: No edema.     Left lower leg: No edema.  Lymphadenopathy:     Cervical: No cervical adenopathy.  Neurological:     General: No focal deficit present.     Mental Status: She is alert and oriented to person, place, and time.  Psychiatric:        Mood and Affect: Mood normal.        Thought Content: Thought content normal.     No results found for any visits on 04/20/24.      Assessment & Plan:   Acute bronchitis due to other specified organisms -     predniSONE ; Take 2 tablets (40 mg total) by mouth daily with breakfast for 5 days.  Dispense: 10 tablet; Refill: 0 -     Doxycycline  Hyclate; Take 1 capsule (100 mg total) by mouth 2 (two) times daily for 7 days.  Dispense: 14 capsule; Refill: 0 -     Airsupra ; Inhale 2 puffs into  the lungs every 4 (four) hours.  Dispense: 11 g; Refill: 1 -     HYDROcodone  Bit-Homatrop MBr; Take 5 mLs by mouth every 8 (eight) hours as needed for cough.  Dispense: 120 mL; Refill: 0  Fever, unspecified fever cause -     methylPREDNISolone  Acetate  Acute cough -     predniSONE ; Take 2 tablets (40 mg total) by mouth daily with breakfast for 5 days.  Dispense: 10 tablet; Refill: 0 -     HYDROcodone  Bit-Homatrop MBr; Take 5 mLs by mouth every 8 (eight) hours as needed for cough.  Dispense: 120 mL; Refill: 0 -     methylPREDNISolone  Acetate  Non-recurrent acute suppurative otitis media of right ear without spontaneous rupture of tympanic membrane -     predniSONE ; Take 2 tablets (40 mg total) by mouth daily with breakfast for 5 days.  Dispense: 10 tablet; Refill: 0 -     Doxycycline  Hyclate; Take 1 capsule (100 mg total) by mouth 2 (two) times daily for 7 days.  Dispense: 14 capsule; Refill: 0  Wheezing -     Airsupra ; Inhale 2 puffs into the lungs every 4  (four) hours.  Dispense: 11 g; Refill: 1      No orders of the defined types were placed in this encounter.    Meds ordered this encounter  Medications   DISCONTD: methylPREDNISolone  acetate (DEPO-MEDROL ) injection 80 mg   predniSONE  (DELTASONE ) 20 MG tablet    Sig: Take 2 tablets (40 mg total) by mouth daily with breakfast for 5 days.    Dispense:  10 tablet    Refill:  0   doxycycline  (VIBRAMYCIN ) 100 MG capsule    Sig: Take 1 capsule (100 mg total) by mouth 2 (two) times daily for 7 days.    Dispense:  14 capsule    Refill:  0   Albuterol -Budesonide  (AIRSUPRA ) 90-80 MCG/ACT AERO    Sig: Inhale 2 puffs into the lungs every 4 (four) hours.    Dispense:  11 g    Refill:  1   HYDROcodone  bit-homatropine (HYCODAN) 5-1.5 MG/5ML syrup    Sig: Take 5 mLs by mouth every 8 (eight) hours as needed for cough.    Dispense:  120 mL    Refill:  0   methylPREDNISolone  acetate (DEPO-MEDROL ) injection 80 mg    Return if symptoms worsen or fail to improve.  Corean LITTIE Ku, FNP

## 2024-04-22 ENCOUNTER — Encounter: Payer: Self-pay | Admitting: Family Medicine

## 2024-04-22 DIAGNOSIS — R051 Acute cough: Secondary | ICD-10-CM

## 2024-04-22 DIAGNOSIS — J208 Acute bronchitis due to other specified organisms: Secondary | ICD-10-CM

## 2024-04-22 DIAGNOSIS — R509 Fever, unspecified: Secondary | ICD-10-CM

## 2024-04-23 ENCOUNTER — Ambulatory Visit: Payer: Self-pay | Admitting: Family Medicine

## 2024-04-23 ENCOUNTER — Ambulatory Visit (INDEPENDENT_AMBULATORY_CARE_PROVIDER_SITE_OTHER)

## 2024-04-23 DIAGNOSIS — R051 Acute cough: Secondary | ICD-10-CM | POA: Diagnosis not present

## 2024-04-23 DIAGNOSIS — R509 Fever, unspecified: Secondary | ICD-10-CM

## 2024-04-23 DIAGNOSIS — J208 Acute bronchitis due to other specified organisms: Secondary | ICD-10-CM

## 2024-04-23 DIAGNOSIS — J069 Acute upper respiratory infection, unspecified: Secondary | ICD-10-CM | POA: Diagnosis not present

## 2024-04-23 DIAGNOSIS — R0602 Shortness of breath: Secondary | ICD-10-CM | POA: Diagnosis not present

## 2024-04-23 DIAGNOSIS — R059 Cough, unspecified: Secondary | ICD-10-CM | POA: Diagnosis not present

## 2024-05-07 ENCOUNTER — Encounter: Payer: Self-pay | Admitting: Family Medicine

## 2024-05-07 DIAGNOSIS — M79645 Pain in left finger(s): Secondary | ICD-10-CM

## 2024-05-07 MED ORDER — AZITHROMYCIN 250 MG PO TABS: ORAL_TABLET | ORAL | 0 refills | Status: DC

## 2024-05-11 ENCOUNTER — Other Ambulatory Visit: Payer: Self-pay

## 2024-05-11 DIAGNOSIS — R051 Acute cough: Secondary | ICD-10-CM

## 2024-05-11 DIAGNOSIS — J208 Acute bronchitis due to other specified organisms: Secondary | ICD-10-CM

## 2024-05-11 MED ORDER — AZITHROMYCIN 250 MG PO TABS: ORAL_TABLET | ORAL | 0 refills | Status: AC

## 2024-05-17 ENCOUNTER — Encounter: Payer: Self-pay | Admitting: Family Medicine

## 2024-05-18 NOTE — Telephone Encounter (Signed)
 Duplicate message

## 2024-05-19 ENCOUNTER — Ambulatory Visit (INDEPENDENT_AMBULATORY_CARE_PROVIDER_SITE_OTHER)

## 2024-05-19 DIAGNOSIS — M79645 Pain in left finger(s): Secondary | ICD-10-CM | POA: Diagnosis not present

## 2024-05-19 DIAGNOSIS — H5213 Myopia, bilateral: Secondary | ICD-10-CM | POA: Diagnosis not present

## 2024-05-19 DIAGNOSIS — M1812 Unilateral primary osteoarthritis of first carpometacarpal joint, left hand: Secondary | ICD-10-CM | POA: Diagnosis not present

## 2024-05-20 ENCOUNTER — Other Ambulatory Visit: Payer: Self-pay

## 2024-05-20 DIAGNOSIS — M2559 Pain in other specified joint: Secondary | ICD-10-CM

## 2024-05-20 DIAGNOSIS — G8929 Other chronic pain: Secondary | ICD-10-CM

## 2024-05-21 ENCOUNTER — Ambulatory Visit: Payer: Self-pay | Admitting: Family Medicine

## 2024-05-25 ENCOUNTER — Other Ambulatory Visit: Payer: Self-pay | Admitting: Family Medicine

## 2024-05-25 DIAGNOSIS — M25552 Pain in left hip: Secondary | ICD-10-CM

## 2024-05-25 DIAGNOSIS — M5440 Lumbago with sciatica, unspecified side: Secondary | ICD-10-CM

## 2024-05-31 ENCOUNTER — Encounter: Payer: Self-pay | Admitting: Radiology

## 2024-06-01 ENCOUNTER — Encounter: Payer: Self-pay | Admitting: Family Medicine

## 2024-06-07 ENCOUNTER — Encounter: Payer: Self-pay | Admitting: Family Medicine

## 2024-06-07 ENCOUNTER — Ambulatory Visit (INDEPENDENT_AMBULATORY_CARE_PROVIDER_SITE_OTHER)

## 2024-06-07 ENCOUNTER — Ambulatory Visit: Admitting: Family Medicine

## 2024-06-07 ENCOUNTER — Telehealth: Payer: Self-pay | Admitting: Family Medicine

## 2024-06-07 VITALS — BP 130/78 | HR 83 | Temp 98.7°F | Ht 64.0 in | Wt 321.4 lb

## 2024-06-07 DIAGNOSIS — R002 Palpitations: Secondary | ICD-10-CM

## 2024-06-07 DIAGNOSIS — R739 Hyperglycemia, unspecified: Secondary | ICD-10-CM | POA: Diagnosis not present

## 2024-06-07 DIAGNOSIS — M1611 Unilateral primary osteoarthritis, right hip: Secondary | ICD-10-CM | POA: Diagnosis not present

## 2024-06-07 DIAGNOSIS — M25551 Pain in right hip: Secondary | ICD-10-CM

## 2024-06-07 DIAGNOSIS — M79662 Pain in left lower leg: Secondary | ICD-10-CM

## 2024-06-07 DIAGNOSIS — M47816 Spondylosis without myelopathy or radiculopathy, lumbar region: Secondary | ICD-10-CM | POA: Diagnosis not present

## 2024-06-07 DIAGNOSIS — R350 Frequency of micturition: Secondary | ICD-10-CM

## 2024-06-07 DIAGNOSIS — R232 Flushing: Secondary | ICD-10-CM | POA: Diagnosis not present

## 2024-06-07 DIAGNOSIS — R110 Nausea: Secondary | ICD-10-CM

## 2024-06-07 DIAGNOSIS — Z6841 Body Mass Index (BMI) 40.0 and over, adult: Secondary | ICD-10-CM

## 2024-06-07 DIAGNOSIS — G4733 Obstructive sleep apnea (adult) (pediatric): Secondary | ICD-10-CM

## 2024-06-07 DIAGNOSIS — G8929 Other chronic pain: Secondary | ICD-10-CM | POA: Diagnosis not present

## 2024-06-07 LAB — COMPREHENSIVE METABOLIC PANEL WITH GFR
ALT: 78 U/L — ABNORMAL HIGH (ref 0–35)
AST: 98 U/L — ABNORMAL HIGH (ref 0–37)
Albumin: 4 g/dL (ref 3.5–5.2)
Alkaline Phosphatase: 107 U/L (ref 39–117)
BUN: 9 mg/dL (ref 6–23)
CO2: 25 meq/L (ref 19–32)
Calcium: 9.4 mg/dL (ref 8.4–10.5)
Chloride: 103 meq/L (ref 96–112)
Creatinine, Ser: 0.67 mg/dL (ref 0.40–1.20)
GFR: 100.58 mL/min (ref >60.00)
Glucose, Bld: 84 mg/dL (ref 70–99)
Potassium: 3.8 meq/L (ref 3.5–5.1)
Sodium: 137 meq/L (ref 135–145)
Total Bilirubin: 0.7 mg/dL (ref 0.2–1.2)
Total Protein: 7.4 g/dL (ref 6.0–8.3)

## 2024-06-07 LAB — URINALYSIS, ROUTINE W REFLEX MICROSCOPIC
Bilirubin Urine: NEGATIVE
Hgb urine dipstick: NEGATIVE
Ketones, ur: NEGATIVE
Nitrite: NEGATIVE
RBC / HPF: NONE SEEN (ref 0–?)
Specific Gravity, Urine: 1.015 (ref 1.000–1.030)
Total Protein, Urine: NEGATIVE
Urine Glucose: NEGATIVE
Urobilinogen, UA: 0.2 (ref 0.0–1.0)
pH: 6 (ref 5.0–8.0)

## 2024-06-07 LAB — CBC WITH DIFFERENTIAL/PLATELET
Basophils Absolute: 0.1 K/uL (ref 0.0–0.1)
Basophils Relative: 1.2 % (ref 0.0–3.0)
Eosinophils Absolute: 0.8 K/uL — ABNORMAL HIGH (ref 0.0–0.7)
Eosinophils Relative: 12.4 % — ABNORMAL HIGH (ref 0.0–5.0)
HCT: 43 % (ref 36.0–46.0)
Hemoglobin: 14.4 g/dL (ref 12.0–15.0)
Lymphocytes Relative: 28.6 % (ref 12.0–46.0)
Lymphs Abs: 2 K/uL (ref 0.7–4.0)
MCHC: 33.6 g/dL (ref 30.0–36.0)
MCV: 86.6 fl (ref 78.0–100.0)
Monocytes Absolute: 0.4 K/uL (ref 0.1–1.0)
Monocytes Relative: 6.5 % (ref 3.0–12.0)
Neutro Abs: 3.5 K/uL (ref 1.4–7.7)
Neutrophils Relative %: 51.3 % (ref 43.0–77.0)
Platelets: 252 K/uL (ref 150.0–400.0)
RBC: 4.97 Mil/uL (ref 3.87–5.11)
RDW: 14.3 % (ref 11.5–15.5)
WBC: 6.9 K/uL (ref 4.0–10.5)

## 2024-06-07 LAB — VITAMIN B12: Vitamin B-12: 368 pg/mL (ref 211–911)

## 2024-06-07 LAB — TSH: TSH: 3.47 u[IU]/mL (ref 0.35–5.50)

## 2024-06-07 LAB — D-DIMER, QUANTITATIVE: D-Dimer, Quant: 0.79 ug{FEU}/mL — ABNORMAL HIGH (ref <0.50)

## 2024-06-07 LAB — HEMOGLOBIN A1C: Hgb A1c MFr Bld: 5.9 % (ref 4.6–6.5)

## 2024-06-07 LAB — VITAMIN D 25 HYDROXY (VIT D DEFICIENCY, FRACTURES): VITD: 21.25 ng/mL — ABNORMAL LOW (ref 30.00–100.00)

## 2024-06-07 MED ORDER — KETOROLAC TROMETHAMINE 60 MG/2ML IM SOLN
60.0000 mg | Freq: Once | INTRAMUSCULAR | Status: AC
  Administered 2024-06-07: 60 mg via INTRAMUSCULAR

## 2024-06-07 MED ORDER — METHYLPREDNISOLONE ACETATE 80 MG/ML IJ SUSP
80.0000 mg | Freq: Once | INTRAMUSCULAR | Status: AC
  Administered 2024-06-07: 80 mg via INTRAMUSCULAR

## 2024-06-07 MED ORDER — OXYCODONE-ACETAMINOPHEN 7.5-325 MG PO TABS: 1.0000 | ORAL_TABLET | ORAL | 0 refills | Status: DC | PRN

## 2024-06-07 NOTE — Telephone Encounter (Signed)
 Addressed in OV today.

## 2024-06-07 NOTE — Patient Instructions (Addendum)
 You have received an injection of Toradol  in office today for pain. Do not take any NSAIDs including aspirin, Aleve, ibuprofen, Celebrex , meloxicam  within the next 6 hours after your injection.  You have received a steroid injection in the office today.  We are checking labs today, will be in contact with any results that require further attention  We are getting an xray today. We will be in contact with any abnormal results that require further attention.

## 2024-06-07 NOTE — Progress Notes (Addendum)
 Acute Office Visit  Subjective:     Patient ID: Alexa West, female    DOB: 1970-10-22, 53 y.o.   MRN: [id number]  Chief Complaint  Patient presents with   Back Pain    Right sided, back and hip pain. Flared up again this Friday, worse when going to change positions. Had some left over Oxy and muscle relaxer's that she tried for pain    Back Pain    Discussed the use of AI scribe software for clinical note transcription with the patient, who gave verbal consent to proceed.  History of Present Illness Alexa West is a 53 year old female who presents with right hip pain.  Right hip pain - Persistent right hip pain - No improvement with Mobic  and methocarbamol  - Pain impairs ability to transition from sitting to standing - Limp present, favoring the right side - No pain medication taken today  Facial flushing with nausea and tachycardia - Episodes of facial flushing - Associated nausea and tachycardia - Concern for possible relation to blood glucose levels - Inquiring about home glucose monitoring  Intermittent left calf pain - Intermittent pain in the left calf - Concern for possible blood clot     Review of Systems  Musculoskeletal:  Positive for back pain.   Per HPI      Objective:    BP 130/78 (BP Location: Left Arm, Patient Position: Sitting)   Pulse 83   Temp 98.7 F (37.1 C) (Temporal)   Ht 5' 4 (1.626 m)   Wt (!) 321 lb 6.4 oz (145.8 kg)   LMP 02/25/2023   SpO2 95%   BMI 55.17 kg/m    Physical Exam Vitals and nursing note reviewed.  Constitutional:      General: She is not in acute distress.    Comments: Appears uncomfortable  HENT:     Head: Normocephalic and atraumatic.     Right Ear: External ear normal.     Left Ear: External ear normal.     Nose: Nose normal.     Mouth/Throat:     Mouth: Mucous membranes are moist.     Pharynx: Oropharynx is clear.  Eyes:     Extraocular Movements: Extraocular movements intact.      Pupils: Pupils are equal, round, and reactive to light.  Cardiovascular:     Rate and Rhythm: Normal rate and regular rhythm.     Pulses: Normal pulses.     Heart sounds: Normal heart sounds.  Pulmonary:     Effort: Pulmonary effort is normal. No respiratory distress.     Breath sounds: Normal breath sounds. No wheezing, rhonchi or rales.  Musculoskeletal:        General: Swelling and tenderness present.     Cervical back: Normal range of motion.     Right lower leg: No edema.     Left lower leg: No edema.     Comments: Left paraspinous muscles mildly swollen, tender, in spasm. LROM with position changes.   Lymphadenopathy:     Cervical: No cervical adenopathy.  Neurological:     General: No focal deficit present.     Mental Status: She is alert and oriented to person, place, and time.  Psychiatric:        Mood and Affect: Mood normal.        Thought Content: Thought content normal.     No results found for any visits on 06/07/24.      Assessment & Plan:  Assessment and Plan Assessment & Plan Right hip pain Chronic pain with difficulty in movement. Previous treatments ineffective. - Administered 60 mg Toradol  for pain relief. - Administered 80 mg Depo-Medrol  for inflammation. - Ordered x-ray of right hip with pelvis. - Sent Percocet to pharmacy for breakthrough pain. - Continue Mobic  and methocarbamol .  Pain of Left Calf Intermittent calf pain with concern for blood clot. - Ordered D-dimer to rule out blood clot.  Episodes of flushing, palpitations, and nausea Episodes suggest possible blood sugar issues or UTI. - Ordered CBC, CMP, and thyroid  function tests. - Ordered urinalysis and culture to rule out UTI.  Hyperglycemia Concern for hyperglycemia contributing to symptoms. - CMP, A1c, UA today - If blood sugar still elevated, will send glucometer to pharmacy to monitor blood sugars at home     Orders Placed This Encounter  Procedures   Urine Culture     Standing Status:   Future    Number of Occurrences:   1    Expiration Date:   07/07/2024   DG HIP UNILAT W OR W/O PELVIS 2-3 VIEWS RIGHT    Standing Status:   Future    Number of Occurrences:   1    Expiration Date:   12/05/2024    Reason for Exam (SYMPTOM  OR DIAGNOSIS REQUIRED):   right hip pain    Is the patient pregnant?:   No    Preferred imaging location?:   [facility]   CBC with Differential/Platelet    Release to patient:   Immediate [1]   Comprehensive metabolic panel with GFR    Release to patient:   Immediate [1]   Hemoglobin A1c   TSH   Vitamin B12   VITAMIN D  25 Hydroxy (Vit-D Deficiency, Fractures)   D-dimer, quantitative   Urinalysis     Meds ordered this encounter  Medications   oxyCODONE -acetaminophen  (PERCOCET) 7.5-325 MG tablet    Sig: Take 1 tablet by mouth every 4 (four) hours as needed for severe pain (pain score 7-10).    Dispense:  30 tablet    Refill:  0   ketorolac  (TORADOL ) injection 60 mg   methylPREDNISolone  acetate (DEPO-MEDROL ) injection 80 mg    Return if symptoms worsen or fail to improve.  Corean LITTIE Ku, FNP

## 2024-06-07 NOTE — Telephone Encounter (Signed)
 Copied from CRM [id number]. Topic: Clinical - Medication Question >> Jun 07, 2024  1:23 PM Suzen RAMAN wrote: Reason for CRM: patient would like to know if medication oxyCODONE -acetaminophen  (PERCOCET) 7.5-325 MG tablet can be sent to   [facility] at   [address] [place], [place] [address]  The current pharmacy was sent to doesn't have medication in stock.

## 2024-06-08 ENCOUNTER — Encounter: Payer: Self-pay | Admitting: Family Medicine

## 2024-06-08 ENCOUNTER — Ambulatory Visit: Payer: Self-pay | Admitting: Family Medicine

## 2024-06-08 DIAGNOSIS — E559 Vitamin D deficiency, unspecified: Secondary | ICD-10-CM

## 2024-06-08 DIAGNOSIS — M79604 Pain in right leg: Secondary | ICD-10-CM

## 2024-06-08 DIAGNOSIS — R7989 Other specified abnormal findings of blood chemistry: Secondary | ICD-10-CM

## 2024-06-08 DIAGNOSIS — R0602 Shortness of breath: Secondary | ICD-10-CM

## 2024-06-08 LAB — URINE CULTURE: Result:: NO GROWTH

## 2024-06-08 MED ORDER — VITAMIN D (ERGOCALCIFEROL) 1.25 MG (50000 UNIT) PO CAPS: 50000.0000 [IU] | ORAL_CAPSULE | ORAL | 0 refills | Status: AC

## 2024-06-08 MED ORDER — OXYCODONE-ACETAMINOPHEN 7.5-325 MG PO TABS: 1.0000 | ORAL_TABLET | ORAL | 0 refills | Status: AC | PRN

## 2024-06-08 NOTE — Addendum Note (Signed)
 Addended by: ALVIA COREAN CROME on: 06/08/2024 08:06 AM   Modules accepted: Orders

## 2024-06-09 ENCOUNTER — Encounter: Payer: Self-pay | Admitting: Family Medicine

## 2024-06-09 ENCOUNTER — Ambulatory Visit (HOSPITAL_COMMUNITY)
Admission: RE | Admit: 2024-06-09 | Discharge: 2024-06-09 | Disposition: A | Discharge status: Home / Self Care | Source: Ambulatory Visit | Attending: Family Medicine | Admitting: Family Medicine

## 2024-06-09 DIAGNOSIS — R7989 Other specified abnormal findings of blood chemistry: Secondary | ICD-10-CM | POA: Diagnosis not present

## 2024-06-09 DIAGNOSIS — M79604 Pain in right leg: Secondary | ICD-10-CM | POA: Diagnosis not present

## 2024-06-09 DIAGNOSIS — M79605 Pain in left leg: Secondary | ICD-10-CM | POA: Diagnosis not present

## 2024-06-10 ENCOUNTER — Inpatient Hospital Stay: Admission: RE | Admit: 2024-06-10 | Discharge: 2024-06-10 | Attending: Family Medicine

## 2024-06-10 ENCOUNTER — Ambulatory Visit: Payer: Self-pay | Admitting: Family Medicine

## 2024-06-10 DIAGNOSIS — R0600 Dyspnea, unspecified: Secondary | ICD-10-CM | POA: Diagnosis not present

## 2024-06-10 DIAGNOSIS — R0602 Shortness of breath: Secondary | ICD-10-CM

## 2024-06-10 DIAGNOSIS — R7989 Other specified abnormal findings of blood chemistry: Secondary | ICD-10-CM

## 2024-06-10 MED ORDER — ZEPBOUND 2.5 MG/0.5ML ~~LOC~~ SOAJ: 2.5000 mg | SUBCUTANEOUS | 0 refills | Status: DC

## 2024-06-10 MED ORDER — IOPAMIDOL (ISOVUE-370) INJECTION 76%
75.0000 mL | Freq: Once | INTRAVENOUS | Status: AC | PRN
  Administered 2024-06-10: 75 mL via INTRAVENOUS

## 2024-06-10 NOTE — Addendum Note (Signed)
 Addended by: ALVIA COREAN CROME on: 06/10/2024 07:32 PM   Modules accepted: Orders

## 2024-06-14 ENCOUNTER — Other Ambulatory Visit (HOSPITAL_COMMUNITY): Payer: Self-pay

## 2024-06-14 ENCOUNTER — Telehealth: Payer: Self-pay

## 2024-06-14 NOTE — Telephone Encounter (Signed)
 Pharmacy Patient Advocate Encounter   Received notification from CoverMyMeds that prior authorization for Zepbound 2.5mg /0.38ml is required/requested.   Insurance verification completed.   The patient is insured through [facility].   Per test claim: PA required; PA submitted to above mentioned insurance via Latent Key/confirmation #/EOC [id number] Status is pending

## 2024-06-16 ENCOUNTER — Other Ambulatory Visit (HOSPITAL_COMMUNITY): Payer: Self-pay

## 2024-06-16 NOTE — Telephone Encounter (Signed)
 Pharmacy Patient Advocate Encounter  Received notification from [facility] that Prior Authorization for Zepbound 2.5mg /0.63ml has been APPROVED from 06/15/24 to 12/11/24   PA #/Case ID/Reference #: [id number]

## 2024-07-02 DIAGNOSIS — G4733 Obstructive sleep apnea (adult) (pediatric): Secondary | ICD-10-CM | POA: Diagnosis not present

## 2024-07-26 ENCOUNTER — Encounter: Payer: Self-pay | Admitting: Family Medicine

## 2024-07-28 NOTE — Progress Notes (Unsigned)
 "  ANNUAL EXAM Patient name: Alexa West MRN [id number]  Date of birth: 08/03/1970 Chief Complaint:   No chief complaint on file.  History of Present Illness:   Alexa West is a 53 y.o. G0P0000 Caucasian female being seen today for a routine annual exam.  Current complaints: ***  Patient's last menstrual period was 02/25/2023.   The pregnancy intention screening data noted above was reviewed. Potential methods of contraception were discussed. The patient elected to proceed with No data recorded.   Last pap 06/25/2021. Results were: NILM w/ HRHPV negative. H/O abnormal pap: {yes/yes***/no:23866} Last mammogram: 07/28/2023. Results were: normal. Family h/o breast cancer: {yes***/no:23838} Last colonoscopy: 07/14/2023. Results were: abnormal 6 polyps. Family h/o colorectal cancer: {yes***/no:23838}     06/07/2024   11:47 AM 01/22/2024    2:29 PM 10/24/2023   10:00 AM 07/28/2023    1:23 PM 07/17/2022   10:22 AM  Depression screen PHQ 2/9  Decreased Interest 1 0 1 0 0  Down, Depressed, Hopeless 1 0 1 0 0  PHQ - 2 Score 2 0 2 0 0  Altered sleeping 2  0    Tired, decreased energy 2  2    Change in appetite 1  1    Feeling bad or failure about yourself  0  1    Trouble concentrating 1  2    Moving slowly or fidgety/restless 0  0    Suicidal thoughts 0  0    PHQ-9 Score 8  8     Difficult doing work/chores Somewhat difficult  Not difficult at all       Data saved with a previous flowsheet row definition        06/07/2024   11:47 AM 01/22/2024    2:29 PM 10/24/2023   10:00 AM  GAD 7 : Generalized Anxiety Score  Nervous, Anxious, on Edge 1 0 0  Control/stop worrying 1 0 1  Worry too much - different things 1 0 1  Trouble relaxing 2 0 2  Restless 2 0 2  Easily annoyed or irritable 1 0 1  Afraid - awful might happen 2 0 0  Total GAD 7 Score 10 0 7  Anxiety Difficulty Somewhat difficult Not difficult at all Not difficult at all     Review of Systems:    Pertinent items are noted in HPI Denies any headaches, blurred vision, fatigue, shortness of breath, chest pain, abdominal pain, abnormal vaginal discharge/itching/odor/irritation, problems with periods, bowel movements, urination, or intercourse unless otherwise stated above. Pertinent History Reviewed:  Reviewed past medical,surgical, social and family history.  Reviewed problem list, medications and allergies. Physical Assessment:  There were no vitals filed for this visit.There is no height or weight on file to calculate BMI.        Physical Examination:   General appearance - well appearing, and in no distress  Mental status - alert, oriented to person, place, and time  Psych:  She has a normal mood and affect  Skin - warm and dry, normal color, no suspicious lesions noted  Chest - effort normal, all lung fields clear to auscultation bilaterally  Heart - normal rate and regular rhythm  Neck:  midline trachea, no thyromegaly or nodules  Breasts - breasts appear normal, no suspicious masses, no skin or nipple changes or  axillary nodes  Abdomen - soft, nontender, nondistended, no masses or organomegaly  Pelvic - VULVA: normal appearing vulva with no masses, tenderness or lesions  VAGINA: normal  appearing vagina with normal color and discharge, no lesions  CERVIX: normal appearing cervix without discharge or lesions, no CMT  Thin prep pap is {Desc; done/not:10129} *** HR HPV cotesting  UTERUS: uterus is felt to be normal size, shape, consistency and nontender   ADNEXA: No adnexal masses or tenderness noted.  Rectal - normal rectal, good sphincter tone, no masses felt. Hemoccult: ***  Extremities:  No swelling or varicosities noted  Chaperone present for exam  No results found for this or any previous visit (from the past 24 hours).  Assessment & Plan:  1) Well-Woman Exam  2) ***  Labs/procedures today: ***  Mammogram: {Mammo f/u:25212::@ 53yo}, or sooner if  problems Colonoscopy: {TCS f/u:25213::@ 53yo}, or sooner if problems  No orders of the defined types were placed in this encounter.   Meds: No orders of the defined types were placed in this encounter.   Follow-up: No follow-ups on file.  Morna LOISE Quale, RN 07/28/2024 11:42 AM "

## 2024-07-30 ENCOUNTER — Encounter (HOSPITAL_BASED_OUTPATIENT_CLINIC_OR_DEPARTMENT_OTHER): Payer: Self-pay | Admitting: Obstetrics & Gynecology

## 2024-07-30 ENCOUNTER — Ambulatory Visit (HOSPITAL_BASED_OUTPATIENT_CLINIC_OR_DEPARTMENT_OTHER): Payer: Self-pay | Admitting: Obstetrics & Gynecology

## 2024-07-30 ENCOUNTER — Other Ambulatory Visit (HOSPITAL_COMMUNITY)
Admission: RE | Admit: 2024-07-30 | Discharge: 2024-07-30 | Disposition: A | Discharge status: Home / Self Care | Source: Ambulatory Visit | Attending: Obstetrics & Gynecology | Admitting: Obstetrics & Gynecology

## 2024-07-30 VITALS — BP 121/72 | HR 83 | Ht 66.5 in | Wt 317.0 lb

## 2024-07-30 DIAGNOSIS — Z1331 Encounter for screening for depression: Secondary | ICD-10-CM | POA: Diagnosis not present

## 2024-07-30 DIAGNOSIS — Z1151 Encounter for screening for human papillomavirus (HPV): Secondary | ICD-10-CM | POA: Diagnosis not present

## 2024-07-30 DIAGNOSIS — Z124 Encounter for screening for malignant neoplasm of cervix: Secondary | ICD-10-CM | POA: Insufficient documentation

## 2024-07-30 DIAGNOSIS — E282 Polycystic ovarian syndrome: Secondary | ICD-10-CM | POA: Diagnosis not present

## 2024-07-30 DIAGNOSIS — Z01419 Encounter for gynecological examination (general) (routine) without abnormal findings: Secondary | ICD-10-CM

## 2024-07-30 DIAGNOSIS — Z6841 Body Mass Index (BMI) 40.0 and over, adult: Secondary | ICD-10-CM

## 2024-08-03 LAB — CYTOLOGY - PAP
Adequacy: ABSENT
Comment: NEGATIVE
Diagnosis: NEGATIVE
High risk HPV: NEGATIVE

## 2024-08-07 ENCOUNTER — Encounter: Payer: Self-pay | Admitting: Family Medicine

## 2024-08-10 ENCOUNTER — Other Ambulatory Visit: Payer: Self-pay

## 2024-08-10 DIAGNOSIS — G4733 Obstructive sleep apnea (adult) (pediatric): Secondary | ICD-10-CM

## 2024-08-10 MED ORDER — ZEPBOUND 5 MG/0.5ML ~~LOC~~ SOAJ: 5.0000 mg | SUBCUTANEOUS | 0 refills | Status: AC

## 2024-08-22 ENCOUNTER — Ambulatory Visit (HOSPITAL_BASED_OUTPATIENT_CLINIC_OR_DEPARTMENT_OTHER): Payer: Self-pay | Admitting: Obstetrics & Gynecology

## 2024-09-02 NOTE — Progress Notes (Unsigned)
 "  Office Visit Note  Patient: Alexa West             Date of Birth: Mar 28, 1971           MRN: [id number]             PCP: Alvia Corean CROME, FNP Referring: Alvia Corean CROME, FNP Visit Date: 09/09/2024 Occupation: Pharmacy tech  Subjective:  No chief complaint on file.   History of Present Illness: Alexa West is a 54 y.o. female ***     Activities of Daily Living:  Patient reports morning stiffness for *** {minute/hour:19697}.   Patient {ACTIONS;DENIES/REPORTS:21021675::Denies} nocturnal pain.  Difficulty dressing/grooming: {ACTIONS;DENIES/REPORTS:21021675::Denies} Difficulty climbing stairs: {ACTIONS;DENIES/REPORTS:21021675::Denies} Difficulty getting out of chair: {ACTIONS;DENIES/REPORTS:21021675::Denies} Difficulty using hands for taps, buttons, cutlery, and/or writing: {ACTIONS;DENIES/REPORTS:21021675::Denies}  No Rheumatology ROS completed.   PMFS History:  Patient Active Problem List   Diagnosis Date Noted   Primary osteoarthritis of left knee 03/02/2024   Pain in both feet 03/02/2024   Chronic pain of left knee 01/24/2024   Other atopic dermatitis 10/03/2023   Attention deficit 10/03/2023   Moderate obstructive sleep apnea 08/15/2023   Excessive daytime sleepiness 06/09/2023   Loud snoring 06/09/2023   Atypical chest pain 06/09/2023   Recurrent major depressive disorder, in partial remission 05/24/2022   Hyperlipidemia 05/03/2021   Generalized osteoarthritis of multiple sites 05/17/2020   Fatigue 05/17/2020   Chronic pain disorder 05/17/2020   Primary osteoarthritis of left hip 03/03/2020   Right knee pain 10/27/2018   Neck pain 05/13/2018   Morbid obesity with BMI of 50.0-59.9, adult (HCC) 05/13/2018   PCOS (polycystic ovarian syndrome) 12/05/2017   History of IBS 12/05/2017   S/P cholecystectomy 12/05/2017   Gastric ulcer with hemorrhage 12/05/2017   Eosinophilic esophagitis 12/05/2017   Obesity, Class III, BMI 40-49.9  (morbid obesity) (HCC) 10/13/2017   Migraine headache without aura 10/13/2017   GI bleed 08/20/2016   Transaminitis 08/20/2016   Prediabetes 08/20/2016   Lumbar pain 05/24/2014   Impaired fasting glucose 09/18/2010   Depression 03/04/2008   Esophageal reflux 03/04/2008    Past Medical History:  Diagnosis Date   Abnormal Pap smear of cervix 1996   HPV +   Angio-edema    Anxiety    Asthma    Chicken pox    Depression    Genital warts    GERD (gastroesophageal reflux disease)    History of diverticulitis    History of genital warts    Hx: UTI (urinary tract infection)    Migraines    Urine incontinence    Urticaria     Family History  Problem Relation Age of Onset   Arthritis Mother    Depression Mother    Hyperlipidemia Mother    Miscarriages / Stillbirths Mother    Skin cancer Mother    Asthma Mother    Early death Father    Alcohol abuse Maternal Grandmother    Arthritis Maternal Grandmother    Depression Maternal Grandmother    Diabetes Maternal Grandmother    Hyperlipidemia Maternal Grandmother    Hypertension Maternal Grandmother    Kidney disease Maternal Grandmother    Arthritis Maternal Grandfather    Hypertension Maternal Grandfather    Kidney disease Maternal Grandfather    Heart attack Maternal Grandfather    Leukemia Maternal Grandfather    Bone cancer Maternal Grandfather    Alcohol abuse Paternal Grandmother    Cancer Paternal Grandmother    Alcohol abuse Paternal Grandfather    Past  Surgical History:  Procedure Laterality Date   ADENOIDECTOMY     CERVICAL BIOPSY  W/ LOOP ELECTRODE EXCISION  1996   CHOLECYSTECTOMY  1997   HERNIA REPAIR  1976   TONSILLECTOMY     TONSILLECTOMY AND ADENOIDECTOMY  1996   urethral stretching  1980   Social History[1] Social History   Social History Narrative   Not on file     Immunization History  Administered Date(s) Administered   PFIZER(Purple Top)SARS-COV-2 Vaccination 04/07/2020   Tdap 07/17/2022      Objective: Vital Signs: LMP 02/25/2023    Physical Exam   Musculoskeletal Exam: ***  CDAI Exam: CDAI Score: -- Patient Global: --; Provider Global: -- Swollen: --; Tender: -- Joint Exam 09/09/2024   No joint exam has been documented for this visit   There is currently no information documented on the homunculus. Go to the Rheumatology activity and complete the homunculus joint exam.  Investigation: No additional findings.  Imaging: No results found.  Recent Labs: Lab Results  Component Value Date   WBC 6.9 06/07/2024   HGB 14.4 06/07/2024   PLT 252.0 06/07/2024   NA 137 06/07/2024   K 3.8 06/07/2024   CL 103 06/07/2024   CO2 25 06/07/2024   GLUCOSE 84 06/07/2024   BUN 9 06/07/2024   CREATININE 0.67 06/07/2024   BILITOT 0.7 06/07/2024   ALKPHOS 107 06/07/2024   AST 98 (H) 06/07/2024   ALT 78 (H) 06/07/2024   PROT 7.4 06/07/2024   ALBUMIN 4.0 06/07/2024   CALCIUM 9.4 06/07/2024   GFRAA 120 03/07/2020    Speciality Comments: No specialty comments available.  Procedures:  No procedures performed Allergies: Cashew nut oil, Milk-related compounds, Wheat, Cephalosporins, and Penicillins   Assessment / Plan:     Visit Diagnoses: No diagnosis found.  Orders: No orders of the defined types were placed in this encounter.  No orders of the defined types were placed in this encounter.   Face-to-face time spent with patient was *** minutes. Greater than 50% of time was spent in counseling and coordination of care.  Follow-Up Instructions: No follow-ups on file.   Alfonso Patterson, LPN  Note - This record has been created using [employer].  Chart creation errors have been sought, but may not always  have been located. Such creation errors do not reflect on  the standard of medical care.    [1]  Social History Tobacco Use   Smoking status: Never   Smokeless tobacco: Never  Vaping Use   Vaping status: Never Used  Substance Use Topics   Alcohol  use: No   Drug use: No   "

## 2024-09-09 ENCOUNTER — Ambulatory Visit
